# Patient Record
Sex: Female | Born: 1990 | Race: White | Hispanic: No | Marital: Married | Smoking: Former smoker
Health system: Southern US, Community
[De-identification: ages and names within clinical notes are randomized; demographics above are authoritative.]

## PROBLEM LIST (undated history)

## (undated) ENCOUNTER — Inpatient Hospital Stay (HOSPITAL_COMMUNITY): Payer: Self-pay

## (undated) DIAGNOSIS — B977 Papillomavirus as the cause of diseases classified elsewhere: Secondary | ICD-10-CM

## (undated) DIAGNOSIS — J45909 Unspecified asthma, uncomplicated: Secondary | ICD-10-CM

## (undated) DIAGNOSIS — A6000 Herpesviral infection of urogenital system, unspecified: Secondary | ICD-10-CM

## (undated) DIAGNOSIS — F32A Depression, unspecified: Secondary | ICD-10-CM

## (undated) DIAGNOSIS — F329 Major depressive disorder, single episode, unspecified: Secondary | ICD-10-CM

## (undated) DIAGNOSIS — F319 Bipolar disorder, unspecified: Secondary | ICD-10-CM

## (undated) DIAGNOSIS — N739 Female pelvic inflammatory disease, unspecified: Secondary | ICD-10-CM

## (undated) DIAGNOSIS — R87629 Unspecified abnormal cytological findings in specimens from vagina: Secondary | ICD-10-CM

## (undated) HISTORY — DX: Major depressive disorder, single episode, unspecified: F32.9

## (undated) HISTORY — DX: Bipolar disorder, unspecified: F31.9

## (undated) HISTORY — PX: WISDOM TOOTH EXTRACTION: SHX21

## (undated) HISTORY — DX: Depression, unspecified: F32.A

---

## 2013-11-15 ENCOUNTER — Encounter (HOSPITAL_BASED_OUTPATIENT_CLINIC_OR_DEPARTMENT_OTHER): Payer: Self-pay | Admitting: Emergency Medicine

## 2013-11-15 ENCOUNTER — Emergency Department (HOSPITAL_BASED_OUTPATIENT_CLINIC_OR_DEPARTMENT_OTHER)
Admission: EM | Admit: 2013-11-15 | Discharge: 2013-11-16 | Disposition: A | Discharge status: Home / Self Care | Payer: Medicaid Other | Attending: Emergency Medicine | Admitting: Emergency Medicine

## 2013-11-15 DIAGNOSIS — J45909 Unspecified asthma, uncomplicated: Secondary | ICD-10-CM | POA: Insufficient documentation

## 2013-11-15 DIAGNOSIS — Z3202 Encounter for pregnancy test, result negative: Secondary | ICD-10-CM | POA: Insufficient documentation

## 2013-11-15 DIAGNOSIS — F17200 Nicotine dependence, unspecified, uncomplicated: Secondary | ICD-10-CM | POA: Insufficient documentation

## 2013-11-15 DIAGNOSIS — N390 Urinary tract infection, site not specified: Secondary | ICD-10-CM | POA: Insufficient documentation

## 2013-11-15 HISTORY — DX: Unspecified asthma, uncomplicated: J45.909

## 2013-11-15 LAB — URINE MICROSCOPIC-ADD ON

## 2013-11-15 LAB — URINALYSIS, ROUTINE W REFLEX MICROSCOPIC
Bilirubin Urine: NEGATIVE
Glucose, UA: NEGATIVE mg/dL
HGB URINE DIPSTICK: NEGATIVE
Ketones, ur: 15 mg/dL — AB
Nitrite: NEGATIVE
PROTEIN: 100 mg/dL — AB
Specific Gravity, Urine: 1.027 (ref 1.005–1.030)
Urobilinogen, UA: 0.2 mg/dL (ref 0.0–1.0)
pH: 6 (ref 5.0–8.0)

## 2013-11-15 LAB — PREGNANCY, URINE: Preg Test, Ur: NEGATIVE

## 2013-11-15 NOTE — ED Notes (Signed)
Urinary frequency and suprapubic pain while urinating.  Right low back pain.  Sts she also has  A "yeast infection".

## 2013-11-16 MED ORDER — FLUCONAZOLE 50 MG PO TABS
150.0000 mg | ORAL_TABLET | Freq: Once | ORAL | Status: AC
  Administered 2013-11-16: 150 mg via ORAL
  Filled 2013-11-16 (×2): qty 1

## 2013-11-16 MED ORDER — NITROFURANTOIN MONOHYD MACRO 100 MG PO CAPS: 100.0000 mg | ORAL_CAPSULE | Freq: Two times a day (BID) | ORAL | Status: DC

## 2013-11-16 MED ORDER — NITROFURANTOIN MONOHYD MACRO 100 MG PO CAPS
100.0000 mg | ORAL_CAPSULE | Freq: Once | ORAL | Status: AC
  Administered 2013-11-16: 100 mg via ORAL
  Filled 2013-11-16: qty 1

## 2013-11-16 NOTE — ED Provider Notes (Signed)
CSN: [id number]     Arrival date & time 11/15/13  2224 History   First MD Initiated Contact with Patient 11/16/13 0117     Chief Complaint  Patient presents with  . Dysuria   (Consider location/radiation/quality/duration/timing/severity/associated sxs/prior Treatment) HPI 23 year old female with a several day history of urinary frequency, urinating small amounts and discomfort with urination. The discomfort is located in her suprapubic region and is characterized as like previous urinary tract infections. She is also having low back pain. She denies nausea, vomiting, diarrhea, fever, chills or vaginal bleeding.   Past Medical History  Diagnosis Date  . Acute URI   . Asthma    History reviewed. No pertinent past surgical history. No family history on file. History  Substance Use Topics  . Smoking status: Current Every Day Smoker -- 1.00 packs/day  . Smokeless tobacco: Not on file  . Alcohol Use: No   OB History   Grav Para Term Preterm Abortions TAB SAB Ect Mult Living                 Review of Systems  All other systems reviewed and are negative.    Allergies  Review of patient's allergies indicates no known allergies.  Home Medications  No current outpatient prescriptions on file. BP 115/72  Pulse 88  Temp(Src) 98.3 F (36.8 C) (Oral)  Resp 16  Ht 5\' 8"  (1.727 m)  Wt 141 lb 8 oz (64.184 kg)  BMI 21.52 kg/m2  SpO2 100%  LMP 11/05/2013  Physical Exam General: Well-developed, well-nourished female in no acute distress; appearance consistent with age of record HENT: normocephalic; atraumatic Eyes: pupils equal, round and reactive to light; extraocular muscles intact Neck: supple Heart: regular rate and rhythm; no murmurs, rubs or gallops Lungs: clear to auscultation bilaterally Abdomen: soft; nondistended; slight suprapubic tenderness; no masses or hepatosplenomegaly; bowel sounds present GU: No CVA tenderness Extremities: No deformity; full range of motion;  pulses normal Neurologic: Awake, alert and oriented; motor function intact in all extremities and symmetric; no facial droop Skin: Warm and dry Psychiatric: Normal mood and affect    ED Course  Procedures (including critical care time)  MDM   Nursing notes and vitals signs, including pulse oximetry, reviewed.  Summary of this visit's results, reviewed by myself:  Labs:  Results for orders placed during the hospital encounter of 11/15/13 (from the past 24 hour(s))  PREGNANCY, URINE     Status: None   Collection Time    11/15/13 10:45 PM      Result Value Range   Preg Test, Ur NEGATIVE  NEGATIVE  URINALYSIS, ROUTINE W REFLEX MICROSCOPIC     Status: Abnormal   Collection Time    11/15/13 10:45 PM      Result Value Range   Color, Urine YELLOW  YELLOW   APPearance CLOUDY (*) CLEAR   Specific Gravity, Urine 1.027  1.005 - 1.030   pH 6.0  5.0 - 8.0   Glucose, UA NEGATIVE  NEGATIVE mg/dL   Hgb urine dipstick NEGATIVE  NEGATIVE   Bilirubin Urine NEGATIVE  NEGATIVE   Ketones, ur 15 (*) NEGATIVE mg/dL   Protein, ur 284100 (*) NEGATIVE mg/dL   Urobilinogen, UA 0.2  0.0 - 1.0 mg/dL   Nitrite NEGATIVE  NEGATIVE   Leukocytes, UA MODERATE (*) NEGATIVE  URINE MICROSCOPIC-ADD ON     Status: Abnormal   Collection Time    11/15/13 10:45 PM      Result Value Range   Squamous Epithelial /  LPF FEW (*) RARE   WBC, UA 21-50  <3 WBC/hpf   RBC / HPF 0-2  <3 RBC/hpf   Bacteria, UA FEW (*) RARE   Casts WBC CAST (*) NEGATIVE    Imaging Studies: No results found.      Hanley Seamen, MD 11/16/13 [id number]

## 2013-11-16 NOTE — ED Notes (Signed)
MD at bedside. 

## 2013-11-19 LAB — URINE CULTURE: Colony Count: >=100000

## 2014-03-27 ENCOUNTER — Emergency Department (HOSPITAL_BASED_OUTPATIENT_CLINIC_OR_DEPARTMENT_OTHER)
Admission: EM | Admit: 2014-03-27 | Discharge: 2014-03-28 | Disposition: A | Discharge status: Home / Self Care | Payer: Medicaid Other | Attending: Emergency Medicine | Admitting: Emergency Medicine

## 2014-03-27 ENCOUNTER — Encounter (HOSPITAL_BASED_OUTPATIENT_CLINIC_OR_DEPARTMENT_OTHER): Payer: Self-pay | Admitting: Emergency Medicine

## 2014-03-27 DIAGNOSIS — N390 Urinary tract infection, site not specified: Secondary | ICD-10-CM

## 2014-03-27 DIAGNOSIS — F17200 Nicotine dependence, unspecified, uncomplicated: Secondary | ICD-10-CM | POA: Insufficient documentation

## 2014-03-27 DIAGNOSIS — Z792 Long term (current) use of antibiotics: Secondary | ICD-10-CM | POA: Insufficient documentation

## 2014-03-27 DIAGNOSIS — Z3202 Encounter for pregnancy test, result negative: Secondary | ICD-10-CM | POA: Insufficient documentation

## 2014-03-27 DIAGNOSIS — J45909 Unspecified asthma, uncomplicated: Secondary | ICD-10-CM | POA: Insufficient documentation

## 2014-03-27 LAB — URINALYSIS, ROUTINE W REFLEX MICROSCOPIC
Bilirubin Urine: NEGATIVE
Glucose, UA: NEGATIVE mg/dL
Ketones, ur: NEGATIVE mg/dL
NITRITE: POSITIVE — AB
PH: 6 (ref 5.0–8.0)
Protein, ur: NEGATIVE mg/dL
SPECIFIC GRAVITY, URINE: 1.023 (ref 1.005–1.030)
Urobilinogen, UA: 0.2 mg/dL (ref 0.0–1.0)

## 2014-03-27 LAB — URINE MICROSCOPIC-ADD ON

## 2014-03-27 LAB — PREGNANCY, URINE: PREG TEST UR: NEGATIVE

## 2014-03-27 NOTE — ED Provider Notes (Signed)
CSN: [id number]     Arrival date & time 03/27/14  2152 History  This chart was scribed for Amy SkeensJoshua M Zavitz, MD by Charline BillsEssence Howell, ED Scribe. The patient was seen in room MH09/MH09. Patient's care was started at 11:52 PM.   Chief Complaint  Patient presents with  . Urinary Tract Infection   The history is provided by the patient. No language interpreter was used.   HPI Comments: Amy Little is a 23 y.o. female who presents to the Emergency Department complaining of urinary frequency. She reports associated dysuria and mild abdominal pain. She denies fever, chills, vomiting, diarrhea, SOB.  Past Medical History  Diagnosis Date  . Acute URI   . Asthma    History reviewed. No pertinent past surgical history. No family history on file. History  Substance Use Topics  . Smoking status: Current Every Day Smoker -- 1.00 packs/day  . Smokeless tobacco: Not on file  . Alcohol Use: No   OB History   Grav Para Term Preterm Abortions TAB SAB Ect Mult Living                 Review of Systems  Constitutional: Negative for fever and chills.  Respiratory: Negative for shortness of breath.   Gastrointestinal: Positive for abdominal pain. Negative for vomiting and diarrhea.  Genitourinary: Positive for dysuria and frequency.  All other systems reviewed and are negative.  Allergies  Review of patient's allergies indicates no known allergies.  Home Medications   Prior to Admission medications   Medication Sig Start Date End Date Taking? Authorizing Provider  nitrofurantoin, macrocrystal-monohydrate, (MACROBID) 100 MG capsule Take 1 capsule (100 mg total) by mouth 2 (two) times daily. X 7 days 11/16/13   Hanley SeamenJohn L Molpus, MD   Triage Vitals: BP 118/76  Pulse 78  Temp(Src) 97.8 F (36.6 C) (Oral)  Resp 18  Ht 5\' 8"  (1.727 m)  Wt 150 lb (68.04 kg)  BMI 22.81 kg/m2  SpO2 100%  LMP 03/13/2014 Physical Exam  Nursing note and vitals reviewed. Constitutional: She is oriented to person, place,  and time. She appears well-developed and well-nourished.  HENT:  Head: Normocephalic and atraumatic.  Eyes: Conjunctivae and EOM are normal.  Neck: Neck supple.  Cardiovascular: Normal rate.   Pulmonary/Chest: Effort normal. No respiratory distress.  Abdominal: Soft. There is no rebound and no guarding.  Musculoskeletal: Normal range of motion.  Neurological: She is alert and oriented to person, place, and time.  Skin: Skin is warm and dry.  Psychiatric: She has a normal mood and affect. Her behavior is normal.   ED Course  Procedures (including critical care time) DIAGNOSTIC STUDIES: Oxygen Saturation is 100% on RA, normal by my interpretation.    COORDINATION OF CARE: 11:54 PM-Discussed treatment plan with pt at bedside and pt agreed to plan.   Labs Review Labs Reviewed  URINALYSIS, ROUTINE W REFLEX MICROSCOPIC - Abnormal; Notable for the following:    APPearance CLOUDY (*)    Hgb urine dipstick SMALL (*)    Nitrite POSITIVE (*)    Leukocytes, UA MODERATE (*)    All other components within normal limits  URINE MICROSCOPIC-ADD ON - Abnormal; Notable for the following:    Squamous Epithelial / LPF FEW (*)    Bacteria, UA MANY (*)    All other components within normal limits  PREGNANCY, URINE    Imaging Review No results found.   EKG Interpretation None      MDM   Final diagnoses:  UTI (  lower urinary tract infection)    Well-appearing female with clinical UTI. Urinalysis confirm. No signs of pyelonephritis. I personally performed the services described in this documentation, which was scribed in my presence. The recorded information has been reviewed and is accurate.    Amy SkeensJoshua M Zavitz, MD 03/28/14 [phone]

## 2014-03-27 NOTE — ED Notes (Signed)
Pt. Reports urinary frequency and painful urination.  Pt. Reports having UTI in past.

## 2014-03-28 ENCOUNTER — Telehealth (HOSPITAL_BASED_OUTPATIENT_CLINIC_OR_DEPARTMENT_OTHER): Payer: Self-pay | Admitting: *Deleted

## 2014-03-28 MED ORDER — CEPHALEXIN 500 MG PO CAPS: 500.0000 mg | ORAL_CAPSULE | Freq: Two times a day (BID) | ORAL | Status: DC

## 2014-03-28 MED ORDER — CEPHALEXIN 250 MG PO CAPS
500.0000 mg | ORAL_CAPSULE | Freq: Once | ORAL | Status: AC
  Administered 2014-03-28: 500 mg via ORAL
  Filled 2014-03-28: qty 2

## 2014-03-28 MED ORDER — ACETAMINOPHEN 325 MG PO TABS
650.0000 mg | ORAL_TABLET | Freq: Once | ORAL | Status: AC
  Administered 2014-03-28: 650 mg via ORAL
  Filled 2014-03-28: qty 2

## 2014-03-28 NOTE — Discharge Instructions (Signed)
If you were given medicines take as directed.  If you are on coumadin or contraceptives realize their levels and effectiveness is altered by many different medicines.  If you have any reaction (rash, tongues swelling, other) to the medicines stop taking and see a physician.   Please follow up as directed and return to the ER or see a physician for new or worsening symptoms.  Thank you. Filed Vitals:   03/27/14 2159  BP: 118/76  Pulse: 78  Temp: 97.8 F (36.6 C)  TempSrc: Oral  Resp: 18  Height: 5\' 8"  (1.727 m)  Weight: 150 lb (68.04 kg)  SpO2: 100%

## 2014-03-28 NOTE — ED Notes (Signed)
[facility] for keflex [facility]

## 2014-04-07 ENCOUNTER — Emergency Department (HOSPITAL_BASED_OUTPATIENT_CLINIC_OR_DEPARTMENT_OTHER)
Admission: EM | Admit: 2014-04-07 | Discharge: 2014-04-07 | Disposition: A | Discharge status: Home / Self Care | Payer: Medicaid Other | Attending: Emergency Medicine | Admitting: Emergency Medicine

## 2014-04-07 ENCOUNTER — Emergency Department (HOSPITAL_BASED_OUTPATIENT_CLINIC_OR_DEPARTMENT_OTHER): Payer: Medicaid Other

## 2014-04-07 ENCOUNTER — Encounter (HOSPITAL_BASED_OUTPATIENT_CLINIC_OR_DEPARTMENT_OTHER): Payer: Self-pay | Admitting: Emergency Medicine

## 2014-04-07 DIAGNOSIS — Z3202 Encounter for pregnancy test, result negative: Secondary | ICD-10-CM | POA: Insufficient documentation

## 2014-04-07 DIAGNOSIS — Z792 Long term (current) use of antibiotics: Secondary | ICD-10-CM | POA: Insufficient documentation

## 2014-04-07 DIAGNOSIS — J45909 Unspecified asthma, uncomplicated: Secondary | ICD-10-CM | POA: Insufficient documentation

## 2014-04-07 DIAGNOSIS — Z8709 Personal history of other diseases of the respiratory system: Secondary | ICD-10-CM | POA: Insufficient documentation

## 2014-04-07 DIAGNOSIS — F17200 Nicotine dependence, unspecified, uncomplicated: Secondary | ICD-10-CM | POA: Insufficient documentation

## 2014-04-07 DIAGNOSIS — Z79899 Other long term (current) drug therapy: Secondary | ICD-10-CM | POA: Insufficient documentation

## 2014-04-07 DIAGNOSIS — R1110 Vomiting, unspecified: Secondary | ICD-10-CM | POA: Insufficient documentation

## 2014-04-07 DIAGNOSIS — R1031 Right lower quadrant pain: Secondary | ICD-10-CM | POA: Insufficient documentation

## 2014-04-07 LAB — URINALYSIS, ROUTINE W REFLEX MICROSCOPIC
BILIRUBIN URINE: NEGATIVE
GLUCOSE, UA: NEGATIVE mg/dL
HGB URINE DIPSTICK: NEGATIVE
KETONES UR: NEGATIVE mg/dL
Leukocytes, UA: NEGATIVE
Nitrite: NEGATIVE
PROTEIN: NEGATIVE mg/dL
Specific Gravity, Urine: 1.007 (ref 1.005–1.030)
Urobilinogen, UA: 0.2 mg/dL (ref 0.0–1.0)
pH: 7 (ref 5.0–8.0)

## 2014-04-07 LAB — CBC WITH DIFFERENTIAL/PLATELET
Basophils Absolute: 0 10*3/uL (ref 0.0–0.1)
Basophils Relative: 0 % (ref 0–1)
EOS PCT: 7 % — AB (ref 0–5)
Eosinophils Absolute: 0.5 10*3/uL (ref 0.0–0.7)
HCT: 40.8 % (ref 36.0–46.0)
Hemoglobin: 14.1 g/dL (ref 12.0–15.0)
LYMPHS PCT: 31 % (ref 12–46)
Lymphs Abs: 2.2 10*3/uL (ref 0.7–4.0)
MCH: 31.9 pg (ref 26.0–34.0)
MCHC: 34.6 g/dL (ref 30.0–36.0)
MCV: 92.3 fL (ref 78.0–100.0)
Monocytes Absolute: 0.5 10*3/uL (ref 0.1–1.0)
Monocytes Relative: 8 % (ref 3–12)
NEUTROS ABS: 3.9 10*3/uL (ref 1.7–7.7)
Neutrophils Relative %: 55 % (ref 43–77)
PLATELETS: 243 10*3/uL (ref 150–400)
RBC: 4.42 MIL/uL (ref 3.87–5.11)
RDW: 12.3 % (ref 11.5–15.5)
WBC: 7.2 10*3/uL (ref 4.0–10.5)

## 2014-04-07 LAB — BASIC METABOLIC PANEL
BUN: 8 mg/dL (ref 6–23)
CO2: 25 mEq/L (ref 19–32)
Calcium: 9.8 mg/dL (ref 8.4–10.5)
Chloride: 102 mEq/L (ref 96–112)
Creatinine, Ser: 0.7 mg/dL (ref 0.50–1.10)
GFR calc Af Amer: >90 mL/min (ref >90)
Glucose, Bld: 90 mg/dL (ref 70–99)
POTASSIUM: 4 meq/L (ref 3.7–5.3)
Sodium: 140 mEq/L (ref 137–147)

## 2014-04-07 LAB — WET PREP, GENITAL
CLUE CELLS WET PREP: NONE SEEN
TRICH WET PREP: NONE SEEN
Yeast Wet Prep HPF POC: NONE SEEN

## 2014-04-07 LAB — PREGNANCY, URINE: Preg Test, Ur: NEGATIVE

## 2014-04-07 MED ORDER — TRAMADOL HCL 50 MG PO TABS: 50.0000 mg | ORAL_TABLET | Freq: Four times a day (QID) | ORAL | Status: DC | PRN

## 2014-04-07 MED ORDER — MORPHINE SULFATE 4 MG/ML IJ SOLN
4.0000 mg | Freq: Once | INTRAMUSCULAR | Status: DC
  Filled 2014-04-07: qty 1

## 2014-04-07 MED ORDER — IOHEXOL 300 MG/ML  SOLN
100.0000 mL | Freq: Once | INTRAMUSCULAR | Status: AC | PRN
  Administered 2014-04-07: 100 mL via INTRAVENOUS

## 2014-04-07 MED ORDER — ONDANSETRON HCL 4 MG/2ML IJ SOLN
4.0000 mg | Freq: Once | INTRAMUSCULAR | Status: AC
  Administered 2014-04-07: 4 mg via INTRAVENOUS
  Filled 2014-04-07: qty 2

## 2014-04-07 MED ORDER — IOHEXOL 300 MG/ML  SOLN
50.0000 mL | Freq: Once | INTRAMUSCULAR | Status: AC | PRN
  Administered 2014-04-07: 50 mL via ORAL

## 2014-04-07 NOTE — ED Provider Notes (Signed)
CSN: [id number]     Arrival date & time 04/07/14  1239 History   First MD Initiated Contact with Patient 04/07/14 1349     Chief Complaint  Patient presents with  . Abdominal Pain     (Consider location/radiation/quality/duration/timing/severity/associated sxs/prior Treatment) Patient is a 23 y.o. female presenting with abdominal pain. The history is provided by the patient. No language interpreter was used.  Abdominal Pain Pain location:  RLQ Pain quality: aching   Pain radiates to:  Does not radiate Pain severity:  Moderate Onset quality:  Sudden Duration:  1 day Timing:  Constant Progression:  Unchanged Context: not alcohol use   Relieved by:  Nothing Worsened by:  Nothing tried Associated symptoms: vomiting   Associated symptoms: no fever, no nausea and no vaginal discharge     Past Medical History  Diagnosis Date  . Acute URI   . Asthma    History reviewed. No pertinent past surgical history. History reviewed. No pertinent family history. History  Substance Use Topics  . Smoking status: Current Every Day Smoker -- 1.00 packs/day  . Smokeless tobacco: Not on file  . Alcohol Use: No   OB History   Grav Para Term Preterm Abortions TAB SAB Ect Mult Living                 Review of Systems  Constitutional: Negative for fever.  Respiratory: Negative.   Cardiovascular: Negative.   Gastrointestinal: Positive for vomiting and abdominal pain. Negative for nausea.  Genitourinary: Negative for vaginal discharge.      Allergies  Review of patient's allergies indicates no known allergies.  Home Medications   Prior to Admission medications   Medication Sig Start Date End Date Taking? Authorizing Provider  cephALEXin (KEFLEX) 500 MG capsule Take 1 capsule (500 mg total) by mouth 2 (two) times daily. 03/28/14   Enid SkeensJoshua M Zavitz, MD  nitrofurantoin, macrocrystal-monohydrate, (MACROBID) 100 MG capsule Take 1 capsule (100 mg total) by mouth 2 (two) times daily. X 7 days  11/16/13   Carlisle BeersJohn L Molpus, MD   BP 133/83  Pulse 102  Temp(Src) 98.3 F (36.8 C) (Oral)  Resp 16  Ht 5\' 8"  (1.727 m)  Wt 150 lb (68.04 kg)  BMI 22.81 kg/m2  SpO2 100%  LMP 03/13/2014 Physical Exam  Nursing note and vitals reviewed. Constitutional: She is oriented to person, place, and time. She appears well-developed and well-nourished.  HENT:  Head: Normocephalic and atraumatic.  Cardiovascular: Normal rate and regular rhythm.   Pulmonary/Chest: Effort normal and breath sounds normal.  Abdominal: Bowel sounds are normal. There is tenderness in the right lower quadrant.  Genitourinary:  White discharge. -cmt  Musculoskeletal: Normal range of motion.  Neurological: She is alert and oriented to person, place, and time.  Skin: Skin is warm and dry.  Psychiatric: She has a normal mood and affect.    ED Course  Procedures (including critical care time) Labs Review Labs Reviewed  WET PREP, GENITAL - Abnormal; Notable for the following:    WBC, Wet Prep HPF POC FEW (*)    All other components within normal limits  CBC WITH DIFFERENTIAL - Abnormal; Notable for the following:    Eosinophils Relative 7 (*)    All other components within normal limits  GC/CHLAMYDIA PROBE AMP  URINALYSIS, ROUTINE W REFLEX MICROSCOPIC  PREGNANCY, URINE  BASIC METABOLIC PANEL    Imaging Review Ct Abdomen Pelvis W Contrast  04/07/2014   CLINICAL DATA:  Right lower quadrant pain for 1  day.  EXAM: CT ABDOMEN AND PELVIS WITH CONTRAST  TECHNIQUE: Multidetector CT imaging of the abdomen and pelvis was performed using the standard protocol following bolus administration of intravenous contrast.  CONTRAST:  50 mL OMNIPAQUE IOHEXOL 300 MG/ML SOLN, 100 mL OMNIPAQUE IOHEXOL 300 MG/ML SOLN  COMPARISON:  None.  FINDINGS: The lung bases are clear.  No pleural or pericardial effusion.  The appendix is visualized in the right lower quadrant and appears normal. The stomach and small and large bowel are unremarkable.  Uterus, adnexa and urinary bladder appear normal. The gallbladder, liver, adrenal glands, pancreas and kidneys are unremarkable. Tiny low attenuating lesion in the spleen on image 11 is likely a cyst. Trace amount of free pelvic fluid is consistent with physiologic change. No lymphadenopathy is identified. There is no focal bony abnormality.  IMPRESSION: Negative for appendicitis or other finding to explain the patient's symptoms. Negative exam.   Electronically Signed   By: Drusilla Kannerhomas  Dalessio M.D.   On: 04/07/2014 15:41     EKG Interpretation None      MDM   Final diagnoses:  Right lower quadrant abdominal pain    No acute abnormality for the reason for lower abdomina pain. Will send home with something for pain. Pelvic not consistent with a pid    Teressa LowerVrinda Pickering, NP 04/07/14 1611

## 2014-04-07 NOTE — ED Notes (Signed)
Pt c/o right lower abd pain  X 1 day

## 2014-04-07 NOTE — Discharge Instructions (Signed)
Abdominal Pain, Women °Abdominal (stomach, pelvic, or belly) pain can be caused by many things. It is important to tell your doctor: °· The location of the pain. °· Does it come and go or is it present all the time? °· Are there things that start the pain (eating certain foods, exercise)? °· Are there other symptoms associated with the pain (fever, nausea, vomiting, diarrhea)? °All of this is helpful to know when trying to find the cause of the pain. °CAUSES  °· Stomach: virus or bacteria infection, or ulcer. °· Intestine: appendicitis (inflamed appendix), regional ileitis (Crohn's disease), ulcerative colitis (inflamed colon), irritable bowel syndrome, diverticulitis (inflamed diverticulum of the colon), or cancer of the stomach or intestine. °· Gallbladder disease or stones in the gallbladder. °· Kidney disease, kidney stones, or infection. °· Pancreas infection or cancer. °· Fibromyalgia (pain disorder). °· Diseases of the female organs: °¨ Uterus: fibroid (non-cancerous) tumors or infection. °¨ Fallopian tubes: infection or tubal pregnancy. °¨ Ovary: cysts or tumors. °¨ Pelvic adhesions (scar tissue). °¨ Endometriosis (uterus lining tissue growing in the pelvis and on the pelvic organs). °¨ Pelvic congestion syndrome (female organs filling up with blood just before the menstrual period). °¨ Pain with the menstrual period. °¨ Pain with ovulation (producing an egg). °¨ Pain with an IUD (intrauterine device, birth control) in the uterus. °¨ Cancer of the female organs. °· Functional pain (pain not caused by a disease, may improve without treatment). °· Psychological pain. °· Depression. °DIAGNOSIS  °Your doctor will decide the seriousness of your pain by doing an examination. °· Blood tests. °· X-rays. °· Ultrasound. °· CT scan (computed tomography, special type of X-ray). °· MRI (magnetic resonance imaging). °· Cultures, for infection. °· Barium enema (dye inserted in the large intestine, to better view it with  X-rays). °· Colonoscopy (looking in intestine with a lighted tube). °· Laparoscopy (minor surgery, looking in abdomen with a lighted tube). °· Major abdominal exploratory surgery (looking in abdomen with a large incision). °TREATMENT  °The treatment will depend on the cause of the pain.  °· Many cases can be observed and treated at home. °· Over-the-counter medicines recommended by your caregiver. °· Prescription medicine. °· Antibiotics, for infection. °· Birth control pills, for painful periods or for ovulation pain. °· Hormone treatment, for endometriosis. °· Nerve blocking injections. °· Physical therapy. °· Antidepressants. °· Counseling with a psychologist or psychiatrist. °· Minor or major surgery. °HOME CARE INSTRUCTIONS  °· Do not take laxatives, unless directed by your caregiver. °· Take over-the-counter pain medicine only if ordered by your caregiver. Do not take aspirin because it can cause an upset stomach or bleeding. °· Try a clear liquid diet (broth or water) as ordered by your caregiver. Slowly move to a bland diet, as tolerated, if the pain is related to the stomach or intestine. °· Have a thermometer and take your temperature several times a day, and record it. °· Bed rest and sleep, if it helps the pain. °· Avoid sexual intercourse, if it causes pain. °· Avoid stressful situations. °· Keep your follow-up appointments and tests, as your caregiver orders. °· If the pain does not go away with medicine or surgery, you may try: °¨ Acupuncture. °¨ Relaxation exercises (yoga, meditation). °¨ Group therapy. °¨ Counseling. °SEEK MEDICAL CARE IF:  °· You notice certain foods cause stomach pain. °· Your home care treatment is not helping your pain. °· You need stronger pain medicine. °· You want your IUD removed. °· You feel faint or   lightheaded. °· You develop nausea and vomiting. °· You develop a rash. °· You are having side effects or an allergy to your medicine. °SEEK IMMEDIATE MEDICAL CARE IF:  °· Your  pain does not go away or gets worse. °· You have a fever. °· Your pain is felt only in portions of the abdomen. The right side could possibly be appendicitis. The left lower portion of the abdomen could be colitis or diverticulitis. °· You are passing blood in your stools (bright red or black tarry stools, with or without vomiting). °· You have blood in your urine. °· You develop chills, with or without a fever. °· You pass out. °MAKE SURE YOU:  °· Understand these instructions. °· Will watch your condition. °· Will get help right away if you are not doing well or get worse. °Document Released: 07/24/2007 Document Revised: 12/19/2011 Document Reviewed: 08/13/2009 °[employer]® Patient Information ©2015 [employer]. This information is not intended to replace advice given to you by your health care provider. Make sure you discuss any questions you have with your health care provider. ° °

## 2014-04-08 LAB — GC/CHLAMYDIA PROBE AMP
CT Probe RNA: NEGATIVE
GC Probe RNA: NEGATIVE

## 2014-04-08 NOTE — ED Provider Notes (Signed)
Medical screening examination/treatment/procedure(s) were performed by non-physician practitioner and as supervising physician I was immediately available for consultation/collaboration.   EKG Interpretation None       Ethelda ChickMartha K Linker, MD 04/08/14 [phone]

## 2014-05-03 ENCOUNTER — Emergency Department (HOSPITAL_BASED_OUTPATIENT_CLINIC_OR_DEPARTMENT_OTHER)
Admission: EM | Admit: 2014-05-03 | Discharge: 2014-05-03 | Disposition: A | Discharge status: Home / Self Care | Payer: Medicaid Other | Attending: Emergency Medicine | Admitting: Emergency Medicine

## 2014-05-03 ENCOUNTER — Encounter (HOSPITAL_BASED_OUTPATIENT_CLINIC_OR_DEPARTMENT_OTHER): Payer: Self-pay | Admitting: Emergency Medicine

## 2014-05-03 DIAGNOSIS — Z792 Long term (current) use of antibiotics: Secondary | ICD-10-CM | POA: Diagnosis not present

## 2014-05-03 DIAGNOSIS — H109 Unspecified conjunctivitis: Secondary | ICD-10-CM | POA: Insufficient documentation

## 2014-05-03 DIAGNOSIS — H5789 Other specified disorders of eye and adnexa: Secondary | ICD-10-CM | POA: Diagnosis present

## 2014-05-03 DIAGNOSIS — F17200 Nicotine dependence, unspecified, uncomplicated: Secondary | ICD-10-CM | POA: Insufficient documentation

## 2014-05-03 DIAGNOSIS — J45909 Unspecified asthma, uncomplicated: Secondary | ICD-10-CM | POA: Diagnosis not present

## 2014-05-03 DIAGNOSIS — Z79899 Other long term (current) drug therapy: Secondary | ICD-10-CM | POA: Diagnosis not present

## 2014-05-03 MED ORDER — ERYTHROMYCIN 5 MG/GM OP OINT
TOPICAL_OINTMENT | Freq: Four times a day (QID) | OPHTHALMIC | Status: DC
  Administered 2014-05-03: 1 via OPHTHALMIC
  Filled 2014-05-03: qty 3.5

## 2014-05-03 MED ORDER — ALBUTEROL SULFATE HFA 108 (90 BASE) MCG/ACT IN AERS: 2.0000 | INHALATION_SPRAY | RESPIRATORY_TRACT | Status: DC | PRN

## 2014-05-03 NOTE — Discharge Instructions (Signed)

## 2014-05-03 NOTE — ED Provider Notes (Signed)
CSN: [id number]     Arrival date & time 05/03/14  2330 History   This chart was scribed for Amy Seamen, MD, by Yevette Edwards, ED Scribe. This patient was seen in room MH06/MH06 and the patient's care was started at 11:39 PM.  First MD Initiated Contact with Patient 05/03/14 2338     Chief Complaint  Patient presents with  . Conjunctivitis    HPI HPI Comments: Sparrow Siracusa is a 23 y.o. female who presents to the Emergency Department complaining of irritation and redness of her right eye which was first noticed this morning. She reports subjective swelling and clear discharge from that eye. She denies visual change. She also reports mild pain to her eye when she was in the shower and washed out her eye. The pt endorses a cold last week which included a cough, rhinorrhea, and congestion. The cold symptoms have resolved except for a lingering cough. She denies abdominal pain. She has a h/o asthma, and she requests an inhaler.   Past Medical History  Diagnosis Date  . Acute URI   . Asthma    History reviewed. No pertinent past surgical history. History reviewed. No pertinent family history. History  Substance Use Topics  . Smoking status: Current Every Day Smoker -- 1.00 packs/day  . Smokeless tobacco: Not on file  . Alcohol Use: No    Review of Systems  A complete 10 system review of systems was obtained, and all systems were negative except where indicated in the HPI and PE.   Allergies  Review of patient's allergies indicates no known allergies.  Home Medications   Prior to Admission medications   Medication Sig Start Date End Date Taking? Authorizing Provider  albuterol (PROVENTIL HFA;VENTOLIN HFA) 108 (90 BASE) MCG/ACT inhaler Inhale 2 puffs into the lungs every 4 (four) hours as needed for wheezing or shortness of breath. 05/03/14   Carlisle Beers Dustine Bertini, MD  cephALEXin (KEFLEX) 500 MG capsule Take 1 capsule (500 mg total) by mouth 2 (two) times daily. 03/28/14   Enid Skeens, MD   nitrofurantoin, macrocrystal-monohydrate, (MACROBID) 100 MG capsule Take 1 capsule (100 mg total) by mouth 2 (two) times daily. X 7 days 11/16/13   Carlisle Beers Liley Rake, MD  traMADol (ULTRAM) 50 MG tablet Take 1 tablet (50 mg total) by mouth every 6 (six) hours as needed. 04/07/14   Teressa Lower, NP   Triage Vitals:  BP 121/66  Pulse 83  Temp(Src) 97.9 F (36.6 C) (Oral)  Resp 16  Ht 5\' 8"  (1.727 m)  Wt 143 lb (64.864 kg)  BMI 21.75 kg/m2  SpO2 98%  Physical Exam  General: Well-developed, well-nourished female in no acute distress; appearance consistent with age of record HENT: normocephalic; atraumatic; TMs normal; no pharyngeal erythema or exudate Eyes: pupils equal, round and reactive to light; extraocular muscles intact; right conjunctival injection; no exudate seen Neck: supple Heart: regular rate and rhythm; no murmurs, rubs or gallops Lungs: clear to auscultation bilaterally Abdomen: soft; nondistended; nontender; no masses or hepatosplenomegaly; bowel sounds present Extremities: No deformity; full range of motion; pulses normal Neurologic: Awake, alert and oriented; motor function intact in all extremities and symmetric; no facial droop Skin: Warm and dry Psychiatric: Normal mood and affect  ED Course  Procedures (including critical care time)  DIAGNOSTIC STUDIES: Oxygen Saturation is 98% on room air, normal by my interpretation.    COORDINATION OF CARE:  11:45 PM- Discussed treatment plan with patient, and the patient agreed to the plan.  MDM    Final diagnoses:  Conjunctivitis of right eye   I personally performed the services described in this documentation, which was scribed in my presence. The recorded information has been reviewed and is accurate.   Amy SeamenJohn L Janell Keeling, MD 05/04/14 [phone]

## 2014-05-03 NOTE — ED Notes (Signed)
Pt states she woke up with redness to her right eye. Pt states drainage as well.

## 2014-07-21 ENCOUNTER — Encounter (HOSPITAL_BASED_OUTPATIENT_CLINIC_OR_DEPARTMENT_OTHER): Payer: Self-pay | Admitting: Emergency Medicine

## 2014-07-21 DIAGNOSIS — Z79899 Other long term (current) drug therapy: Secondary | ICD-10-CM | POA: Diagnosis not present

## 2014-07-21 DIAGNOSIS — Z720 Tobacco use: Secondary | ICD-10-CM | POA: Insufficient documentation

## 2014-07-21 DIAGNOSIS — J45909 Unspecified asthma, uncomplicated: Secondary | ICD-10-CM | POA: Insufficient documentation

## 2014-07-21 DIAGNOSIS — Z3202 Encounter for pregnancy test, result negative: Secondary | ICD-10-CM | POA: Insufficient documentation

## 2014-07-21 DIAGNOSIS — N739 Female pelvic inflammatory disease, unspecified: Secondary | ICD-10-CM | POA: Insufficient documentation

## 2014-07-21 DIAGNOSIS — N760 Acute vaginitis: Secondary | ICD-10-CM | POA: Insufficient documentation

## 2014-07-21 DIAGNOSIS — Z792 Long term (current) use of antibiotics: Secondary | ICD-10-CM | POA: Insufficient documentation

## 2014-07-21 DIAGNOSIS — R102 Pelvic and perineal pain: Secondary | ICD-10-CM | POA: Diagnosis present

## 2014-07-21 LAB — URINALYSIS, ROUTINE W REFLEX MICROSCOPIC
Bilirubin Urine: NEGATIVE
GLUCOSE, UA: NEGATIVE mg/dL
Hgb urine dipstick: NEGATIVE
Ketones, ur: NEGATIVE mg/dL
Leukocytes, UA: NEGATIVE
Nitrite: NEGATIVE
PROTEIN: NEGATIVE mg/dL
SPECIFIC GRAVITY, URINE: 1.033 — AB (ref 1.005–1.030)
Urobilinogen, UA: 0.2 mg/dL (ref 0.0–1.0)
pH: 6 (ref 5.0–8.0)

## 2014-07-21 LAB — PREGNANCY, URINE: Preg Test, Ur: NEGATIVE

## 2014-07-21 NOTE — ED Notes (Addendum)
Patient presents to ED with complaints of pelvic pain and lower back pain for 4-5 days ago. Pt states she went to her PCP today and was given 800mg  motrin,  no urine  No blood or pelvic was performed at PCP. Pt states she woke up sweating tonight and pain feels like it is getting worse.  Pt states she took motrin 3 times today with no relief. No complaints of any urinary symptoms. Pt states pain is worse on rt lower side at times. Pt tearful in triage.

## 2014-07-22 ENCOUNTER — Emergency Department (HOSPITAL_BASED_OUTPATIENT_CLINIC_OR_DEPARTMENT_OTHER)
Admission: EM | Admit: 2014-07-22 | Discharge: 2014-07-22 | Disposition: A | Discharge status: Home / Self Care | Payer: Medicaid Other | Attending: Emergency Medicine | Admitting: Emergency Medicine

## 2014-07-22 DIAGNOSIS — N739 Female pelvic inflammatory disease, unspecified: Secondary | ICD-10-CM

## 2014-07-22 DIAGNOSIS — N760 Acute vaginitis: Secondary | ICD-10-CM

## 2014-07-22 DIAGNOSIS — B9689 Other specified bacterial agents as the cause of diseases classified elsewhere: Secondary | ICD-10-CM

## 2014-07-22 LAB — COMPREHENSIVE METABOLIC PANEL
ALK PHOS: 41 U/L (ref 39–117)
ALT: 10 U/L (ref 0–35)
AST: 12 U/L (ref 0–37)
Albumin: 4.1 g/dL (ref 3.5–5.2)
Anion gap: 11 (ref 5–15)
BILIRUBIN TOTAL: 0.3 mg/dL (ref 0.3–1.2)
BUN: 14 mg/dL (ref 6–23)
CHLORIDE: 104 meq/L (ref 96–112)
CO2: 26 mEq/L (ref 19–32)
Calcium: 9.3 mg/dL (ref 8.4–10.5)
Creatinine, Ser: 0.7 mg/dL (ref 0.50–1.10)
GFR calc Af Amer: >90 mL/min (ref >90)
GLUCOSE: 99 mg/dL (ref 70–99)
POTASSIUM: 4.2 meq/L (ref 3.7–5.3)
SODIUM: 141 meq/L (ref 137–147)
Total Protein: 7.1 g/dL (ref 6.0–8.3)

## 2014-07-22 LAB — CBC WITH DIFFERENTIAL/PLATELET
Basophils Absolute: 0.1 10*3/uL (ref 0.0–0.1)
Basophils Relative: 1 % (ref 0–1)
EOS PCT: 6 % — AB (ref 0–5)
Eosinophils Absolute: 0.4 10*3/uL (ref 0.0–0.7)
HCT: 37.3 % (ref 36.0–46.0)
Hemoglobin: 12.9 g/dL (ref 12.0–15.0)
LYMPHS ABS: 2.8 10*3/uL (ref 0.7–4.0)
Lymphocytes Relative: 40 % (ref 12–46)
MCH: 31.9 pg (ref 26.0–34.0)
MCHC: 34.6 g/dL (ref 30.0–36.0)
MCV: 92.3 fL (ref 78.0–100.0)
Monocytes Absolute: 0.5 10*3/uL (ref 0.1–1.0)
Monocytes Relative: 8 % (ref 3–12)
NEUTROS PCT: 45 % (ref 43–77)
Neutro Abs: 3.3 10*3/uL (ref 1.7–7.7)
Platelets: 240 10*3/uL (ref 150–400)
RBC: 4.04 MIL/uL (ref 3.87–5.11)
RDW: 12.2 % (ref 11.5–15.5)
WBC: 7.1 10*3/uL (ref 4.0–10.5)

## 2014-07-22 LAB — SYPHILIS: RPR W/REFLEX TO RPR TITER AND TREPONEMAL ANTIBODIES, TRADITIONAL SCREENING AND DIAGNOSIS ALGORITHM

## 2014-07-22 LAB — WET PREP, GENITAL
Trich, Wet Prep: NONE SEEN
Yeast Wet Prep HPF POC: NONE SEEN

## 2014-07-22 LAB — HIV ANTIBODY (ROUTINE TESTING W REFLEX): HIV: NONREACTIVE

## 2014-07-22 LAB — LIPASE, BLOOD: Lipase: 35 U/L (ref 11–59)

## 2014-07-22 MED ORDER — METRONIDAZOLE 500 MG PO TABS
500.0000 mg | ORAL_TABLET | Freq: Once | ORAL | Status: AC
  Administered 2014-07-22: 500 mg via ORAL
  Filled 2014-07-22: qty 1

## 2014-07-22 MED ORDER — DOXYCYCLINE HYCLATE 100 MG PO CAPS: 100.0000 mg | ORAL_CAPSULE | Freq: Two times a day (BID) | ORAL | Status: DC

## 2014-07-22 MED ORDER — DOXYCYCLINE HYCLATE 100 MG PO TABS
100.0000 mg | ORAL_TABLET | Freq: Once | ORAL | Status: AC
  Administered 2014-07-22: 100 mg via ORAL
  Filled 2014-07-22: qty 1

## 2014-07-22 MED ORDER — METRONIDAZOLE 500 MG PO TABS: 500.0000 mg | ORAL_TABLET | Freq: Two times a day (BID) | ORAL | Status: DC

## 2014-07-22 MED ORDER — OXYCODONE-ACETAMINOPHEN 5-325 MG PO TABS: 1.0000 | ORAL_TABLET | ORAL | Status: DC | PRN

## 2014-07-22 MED ORDER — CEFTRIAXONE SODIUM 250 MG IJ SOLR
250.0000 mg | Freq: Once | INTRAMUSCULAR | Status: AC
  Administered 2014-07-22: 250 mg via INTRAMUSCULAR
  Filled 2014-07-22: qty 250

## 2014-07-22 NOTE — Discharge Instructions (Signed)
Pelvic Inflammatory Disease Pelvic inflammatory disease (PID) refers to an infection in some or all of the female organs. The infection can be in the uterus, ovaries, fallopian tubes, or the surrounding tissues in the pelvis. PID can cause abdominal or pelvic pain that comes on suddenly (acute pelvic pain). PID is a serious infection because it can lead to lasting (chronic) pelvic pain or the inability to have children (infertile).  CAUSES  The infection is often caused by the normal bacteria found in the vaginal tissues. PID may also be caused by an infection that is spread during sexual contact. PID can also occur following:   The birth of a baby.   A miscarriage.   An abortion.   Major pelvic surgery.   The use of an intrauterine device (IUD).   A sexual assault.  RISK FACTORS Certain factors can put a person at higher risk for PID, such as:  Being younger than 25 years.  Being sexually active at [place] age.  Usingnonbarrier contraception.  Havingmultiple sexual partners.  Having sex with someone who has symptoms of a genital infection.  Using oral contraception. Other times, certain behaviors can increase the possibility of getting PID, such as:  Having sex during your period.  Using a vaginal douche.  Having an intrauterine device (IUD) in place. SYMPTOMS   Abdominal or pelvic pain.   Fever.   Chills.   Abnormal vaginal discharge.  Abnormal uterine bleeding.   Unusual pain shortly after finishing your period. DIAGNOSIS  Your caregiver will choose some of the following methods to make a diagnosis, such as:   Performinga physical exam and history. A pelvic exam typically reveals a very tender uterus and surrounding pelvis.   Ordering laboratory tests including a pregnancy test, blood tests, and urine test.  Orderingcultures of the vagina and cervix to check for a sexually transmitted infection (STI).  Performing an ultrasound.    Performing a laparoscopic procedure to look inside the pelvis.  TREATMENT   Antibiotic medicines may be prescribed and taken by mouth.   Sexual partners may be treated when the infection is caused by a sexually transmitted disease (STD).   Hospitalization may be needed to give antibiotics intravenously.  Surgery may be needed, but this is rare. It may take weeks until you are completely well. If you are diagnosed with PID, you should also be checked for human immunodeficiency virus (HIV). HOME CARE INSTRUCTIONS   If given, take your antibiotics as directed. Finish the medicine even if you start to feel better.   Only take over-the-counter or prescription medicines for pain, discomfort, or fever as directed by your caregiver.   Do not have sexual intercourse until treatment is completed or as directed by your caregiver. If PID is confirmed, your recent sexual partner(s) will need treatment.   Keep your follow-up appointments. SEEK MEDICAL CARE IF:   You have increased or abnormal vaginal discharge.   You need prescription medicine for your pain.   You vomit.   You cannot take your medicines.   Your partner has an STD.  SEEK IMMEDIATE MEDICAL CARE IF:   You have a fever.   You have increased abdominal or pelvic pain.   You have chills.   You have pain when you urinate.   You are not better after 72 hours following treatment.  MAKE SURE YOU:   Understand these instructions.  Will watch your condition.  Will get help right away if you are not doing well or get worse.  Document Released: 09/26/2005 Document Revised: 01/21/2013 Document Reviewed: 09/22/2011 [facility] Patient Information 2015 [place], [place]. This information is not intended to replace advice given to you by your health care provider. Make sure you discuss any questions you have with your health care provider.   Bacterial Vaginosis Bacterial vaginosis is a vaginal infection that  occurs when the normal balance of bacteria in the vagina is disrupted. It results from an overgrowth of certain bacteria. This is the most common vaginal infection in women of childbearing age. Treatment is important to prevent complications, especially in pregnant women, as it can cause a premature delivery. CAUSES  Bacterial vaginosis is caused by an increase in harmful bacteria that are normally present in smaller amounts in the vagina. Several different kinds of bacteria can cause bacterial vaginosis. However, the reason that the condition develops is not fully understood. RISK FACTORS Certain activities or behaviors can put you at an increased risk of developing bacterial vaginosis, including:  Having a new sex partner or multiple sex partners.  Douching.  Using an intrauterine device (IUD) for contraception. Women do not get bacterial vaginosis from toilet seats, bedding, swimming pools, or contact with objects around them. SIGNS AND SYMPTOMS  Some women with bacterial vaginosis have no signs or symptoms. Common symptoms include:  Grey vaginal discharge.  A fishlike odor with discharge, especially after sexual intercourse.  Itching or burning of the vagina and vulva.  Burning or pain with urination. DIAGNOSIS  Your health care provider will take a medical history and examine the vagina for signs of bacterial vaginosis. A sample of vaginal fluid may be taken. Your health care provider will look at this sample under a microscope to check for bacteria and abnormal cells. A vaginal pH test may also be done.  TREATMENT  Bacterial vaginosis may be treated with antibiotic medicines. These may be given in the form of a pill or a vaginal cream. A second round of antibiotics may be prescribed if the condition comes back after treatment.  HOME CARE INSTRUCTIONS   Only take over-the-counter or prescription medicines as directed by your health care provider.  If antibiotic medicine was  prescribed, take it as directed. Make sure you finish it even if you start to feel better.  Do not have sex until treatment is completed.  Tell all sexual partners that you have a vaginal infection. They should see their health care provider and be treated if they have problems, such as a mild rash or itching.  Practice safe sex by using condoms and only having one sex partner. SEEK MEDICAL CARE IF:   Your symptoms are not improving after 3 days of treatment.  You have increased discharge or pain.  You have a fever. MAKE SURE YOU:   Understand these instructions.  Will watch your condition.  Will get help right away if you are not doing well or get worse. FOR MORE INFORMATION  Centers for Disease Control and Prevention, Division of STD Prevention: [employer] American Sexual Health Association (ASHA): www.ashastd.org  Document Released: 09/26/2005 Document Revised: 07/17/2013 Document Reviewed: 05/08/2013 [facility] Patient Information 2015 [place], [place]. This information is not intended to replace advice given to you by your health care provider. Make sure you discuss any questions you have with your health care provider.  Doxycycline tablets or capsules What is this medicine? DOXYCYCLINE (dox i SYE kleen) is a tetracycline antibiotic. It kills certain bacteria or stops their growth. It is used to treat many kinds of infections, like dental, skin,  respiratory, and urinary tract infections. It also treats acne, Lyme disease, malaria, and certain sexually transmitted infections. This medicine may be used for other purposes; ask your health care provider or pharmacist if you have questions. COMMON BRAND NAME(S): Acticlate, Adoxa, Adoxa CK, Adoxa Pak, Adoxa TT, Alodox, Avidoxy, Doxal, Monodox, Morgidox 1x, Morgidox 1x Kit, Morgidox 2x, Morgidox 2x Kit, Ocudox, Vibra-Tabs, Vibramycin What should I tell my health care provider before I take this medicine? They need to know if you have  any of these conditions: -liver disease -long exposure to sunlight like working outdoors -stomach problems like colitis -an unusual or allergic reaction to doxycycline, tetracycline antibiotics, other medicines, foods, dyes, or preservatives -pregnant or trying to get pregnant -breast-feeding How should I use this medicine? Take this medicine by mouth with a full glass of water. Follow the directions on the prescription label. It is best to take this medicine without food, but if it upsets your stomach take it with food. Take your medicine at regular intervals. Do not take your medicine more often than directed. Take all of your medicine as directed even if you think you are better. Do not skip doses or stop your medicine early. Talk to your pediatrician regarding the use of this medicine in children. Special care may be needed. While this drug may be prescribed for children as young as 63 years old for selected conditions, precautions do apply. Overdosage: If you think you have taken too much of this medicine contact a poison control center or emergency room at once. NOTE: This medicine is only for you. Do not share this medicine with others. What if I miss a dose? If you miss a dose, take it as soon as you can. If it is almost time for your next dose, take only that dose. Do not take double or extra doses. What may interact with this medicine? -antacids -barbiturates -birth control pills -bismuth subsalicylate -carbamazepine -methoxyflurane -other antibiotics -phenytoin -vitamins that contain iron -warfarin This list may not describe all possible interactions. Give your health care provider a list of all the medicines, herbs, non-prescription drugs, or dietary supplements you use. Also tell them if you smoke, drink alcohol, or use illegal drugs. Some items may interact with your medicine. What should I watch for while using this medicine? Tell your doctor or health care professional if  your symptoms do not improve. Do not treat diarrhea with over the counter products. Contact your doctor if you have diarrhea that lasts more than 2 days or if it is severe and watery. Do not take this medicine just before going to bed. It may not dissolve properly when you lay down and can cause pain in your throat. Drink plenty of fluids while taking this medicine to also help reduce irritation in your throat. This medicine can make you more sensitive to the sun. Keep out of the sun. If you cannot avoid being in the sun, wear protective clothing and use sunscreen. Do not use sun lamps or tanning beds/booths. Birth control pills may not work properly while you are taking this medicine. Talk to your doctor about using an extra method of birth control. If you are being treated for a sexually transmitted infection, avoid sexual contact until you have finished your treatment. Your sexual partner may also need treatment. Avoid antacids, aluminum, calcium, magnesium, and iron products for 4 hours before and 2 hours after taking a dose of this medicine. If you are using this medicine to prevent malaria, you should  still protect yourself from contact with mosquitos. Stay in screened-in areas, use mosquito nets, keep your body covered, and use an insect repellent. What side effects may I notice from receiving this medicine? Side effects that you should report to your doctor or health care professional as soon as possible: -allergic reactions like skin rash, itching or hives, swelling of the face, lips, or tongue -difficulty breathing -fever -itching in the rectal or genital area -pain on swallowing -redness, blistering, peeling or loosening of the skin, including inside the mouth -severe stomach pain or cramps -unusual bleeding or bruising -unusually weak or tired -yellowing of the eyes or skin Side effects that usually do not require medical attention (report to your doctor or health care professional if  they continue or are bothersome): -diarrhea -loss of appetite -nausea, vomiting This list may not describe all possible side effects. Call your doctor for medical advice about side effects. You may report side effects to FDA at 1-800-FDA-1088. Where should I keep my medicine? Keep out of the reach of children. Store at room temperature, below 30 degrees C (86 degrees F). Protect from light. Keep container tightly closed. Throw away any unused medicine after the expiration date. Taking this medicine after the expiration date can make you seriously ill. NOTE: This sheet is a summary. It may not cover all possible information. If you have questions about this medicine, talk to your doctor, pharmacist, or health care provider.  2015, Elsevier/[employer] Standard. (2013-08-02 13:58:06)  Metronidazole tablets or capsules What is this medicine? METRONIDAZOLE (me troe NI da zole) is an antiinfective. It is used to treat certain kinds of bacterial and protozoal infections. It will not work for colds, flu, or other viral infections. This medicine may be used for other purposes; ask your health care provider or pharmacist if you have questions. COMMON BRAND NAME(S): Flagyl What should I tell my health care provider before I take this medicine? They need to know if you have any of these conditions: -anemia or other blood disorders -disease of the nervous system -fungal or yeast infection -if you drink alcohol containing drinks -liver disease -seizures -an unusual or allergic reaction to metronidazole, or other medicines, foods, dyes, or preservatives -pregnant or trying to get pregnant -breast-feeding How should I use this medicine? Take this medicine by mouth with a full glass of water. Follow the directions on the prescription label. Take your medicine at regular intervals. Do not take your medicine more often than directed. Take all of your medicine as directed even if you think you are better. Do not  skip doses or stop your medicine early. Talk to your pediatrician regarding the use of this medicine in children. Special care may be needed. Overdosage: If you think you have taken too much of this medicine contact a poison control center or emergency room at once. NOTE: This medicine is only for you. Do not share this medicine with others. What if I miss a dose? If you miss a dose, take it as soon as you can. If it is almost time for your next dose, take only that dose. Do not take double or extra doses. What may interact with this medicine? Do not take this medicine with any of the following medications: -alcohol or any product that contains alcohol -amprenavir oral solution -cisapride -disulfiram -dofetilide -dronedarone -paclitaxel injection -pimozide -ritonavir oral solution -sertraline oral solution -sulfamethoxazole-trimethoprim injection -thioridazine -ziprasidone This medicine may also interact with the following medications: -birth control pills -cimetidine -lithium -other medicines that  prolong the QT interval (cause an abnormal heart rhythm) -phenobarbital -phenytoin -warfarin This list may not describe all possible interactions. Give your health care provider a list of all the medicines, herbs, non-prescription drugs, or dietary supplements you use. Also tell them if you smoke, drink alcohol, or use illegal drugs. Some items may interact with your medicine. What should I watch for while using this medicine? Tell your doctor or health care professional if your symptoms do not improve or if they get worse. You may get drowsy or dizzy. Do not drive, use machinery, or do anything that needs mental alertness until you know how this medicine affects you. Do not stand or sit up quickly, especially if you are an older patient. This reduces the risk of dizzy or fainting spells. Avoid alcoholic drinks while you are taking this medicine and for three days afterward. Alcohol may  make you feel dizzy, sick, or flushed. If you are being treated for a sexually transmitted disease, avoid sexual contact until you have finished your treatment. Your sexual partner may also need treatment. What side effects may I notice from receiving this medicine? Side effects that you should report to your doctor or health care professional as soon as possible: -allergic reactions like skin rash or hives, swelling of the face, lips, or tongue -confusion, clumsiness -difficulty speaking -discolored or sore mouth -dizziness -fever, infection -numbness, tingling, pain or weakness in the hands or feet -trouble passing urine or change in the amount of urine -redness, blistering, peeling or loosening of the skin, including inside the mouth -seizures -unusually weak or tired -vaginal irritation, dryness, or discharge Side effects that usually do not require medical attention (report to your doctor or health care professional if they continue or are bothersome): -diarrhea -headache -irritability -metallic taste -nausea -stomach pain or cramps -trouble sleeping This list may not describe all possible side effects. Call your doctor for medical advice about side effects. You may report side effects to FDA at 1-800-FDA-1088. Where should I keep my medicine? Keep out of the reach of children. Store at room temperature below 25 degrees C (77 degrees F). Protect from light. Keep container tightly closed. Throw away any unused medicine after the expiration date. NOTE: This sheet is a summary. It may not cover all possible information. If you have questions about this medicine, talk to your doctor, pharmacist, or health care provider.  2015, Elsevier/Gold Standard. (2013-05-03 14:08:39)  Acetaminophen; Oxycodone tablets What is this medicine? ACETAMINOPHEN; OXYCODONE (a set a MEE noe fen; ox i KOE done) is a pain reliever. It is used to treat mild to moderate pain. This medicine may be used for  other purposes; ask your health care provider or pharmacist if you have questions. COMMON BRAND NAME(S): Endocet, Magnacet, Narvox, Percocet, Perloxx, Primalev, Primlev, Roxicet, Xolox What should I tell my health care provider before I take this medicine? They need to know if you have any of these conditions: -brain tumor -Crohn's disease, inflammatory bowel disease, or ulcerative colitis -drug abuse or addiction -head injury -heart or circulation problems -if you often drink alcohol -kidney disease or problems going to the bathroom -liver disease -lung disease, asthma, or breathing problems -an unusual or allergic reaction to acetaminophen, oxycodone, other opioid analgesics, other medicines, foods, dyes, or preservatives -pregnant or trying to get pregnant -breast-feeding How should I use this medicine? Take this medicine by mouth with a full glass of water. Follow the directions on the prescription label. Take your medicine at regular intervals. Do  not take your medicine more often than directed. Talk to your pediatrician regarding the use of this medicine in children. Special care may be needed. Patients over 25 years old may have a stronger reaction and need a smaller dose. Overdosage: If you think you have taken too much of this medicine contact a poison control center or emergency room at once. NOTE: This medicine is only for you. Do not share this medicine with others. What if I miss a dose? If you miss a dose, take it as soon as you can. If it is almost time for your next dose, take only that dose. Do not take double or extra doses. What may interact with this medicine? -alcohol -antihistamines -barbiturates like amobarbital, butalbital, butabarbital, methohexital, pentobarbital, phenobarbital, thiopental, and secobarbital -benztropine -drugs for bladder problems like solifenacin, trospium, oxybutynin, tolterodine, hyoscyamine, and methscopolamine -drugs for breathing problems  like ipratropium and tiotropium -drugs for certain stomach or intestine problems like propantheline, homatropine methylbromide, glycopyrrolate, atropine, belladonna, and dicyclomine -general anesthetics like etomidate, ketamine, nitrous oxide, propofol, desflurane, enflurane, halothane, isoflurane, and sevoflurane -medicines for depression, anxiety, or psychotic disturbances -medicines for sleep -muscle relaxants -naltrexone -narcotic medicines (opiates) for pain -phenothiazines like perphenazine, thioridazine, chlorpromazine, mesoridazine, fluphenazine, prochlorperazine, promazine, and trifluoperazine -scopolamine -tramadol -trihexyphenidyl This list may not describe all possible interactions. Give your health care provider a list of all the medicines, herbs, non-prescription drugs, or dietary supplements you use. Also tell them if you smoke, drink alcohol, or use illegal drugs. Some items may interact with your medicine. What should I watch for while using this medicine? Tell your doctor or health care professional if your pain does not go away, if it gets worse, or if you have new or a different type of pain. You may develop tolerance to the medicine. Tolerance means that you will need a higher dose of the medication for pain relief. Tolerance is normal and is expected if you take this medicine for a long time. Do not suddenly stop taking your medicine because you may develop a severe reaction. Your body becomes used to the medicine. This does NOT mean you are addicted. Addiction is a behavior related to getting and using a drug for a non-medical reason. If you have pain, you have a medical reason to take pain medicine. Your doctor will tell you how much medicine to take. If your doctor wants you to stop the medicine, the dose will be slowly lowered over time to avoid any side effects. You may get drowsy or dizzy. Do not drive, use machinery, or do anything that needs mental alertness until you  know how this medicine affects you. Do not stand or sit up quickly, especially if you are an older patient. This reduces the risk of dizzy or fainting spells. Alcohol may interfere with the effect of this medicine. Avoid alcoholic drinks. There are different types of narcotic medicines (opiates) for pain. If you take more than one type at the same time, you may have more side effects. Give your health care provider a list of all medicines you use. Your doctor will tell you how much medicine to take. Do not take more medicine than directed. Call emergency for help if you have problems breathing. The medicine will cause constipation. Try to have a bowel movement at least every 2 to 3 days. If you do not have a bowel movement for 3 days, call your doctor or health care professional. Do not take Tylenol (acetaminophen) or medicines that have acetaminophen with  this medicine. Too much acetaminophen can be very dangerous. Many nonprescription medicines contain acetaminophen. Always read the labels carefully to avoid taking more acetaminophen. What side effects may I notice from receiving this medicine? Side effects that you should report to your doctor or health care professional as soon as possible: -allergic reactions like skin rash, itching or hives, swelling of the face, lips, or tongue -breathing difficulties, wheezing -confusion -light headedness or fainting spells -severe stomach pain -unusually weak or tired -yellowing of the skin or the whites of the eyes Side effects that usually do not require medical attention (report to your doctor or health care professional if they continue or are bothersome): -dizziness -drowsiness -nausea -vomiting This list may not describe all possible side effects. Call your doctor for medical advice about side effects. You may report side effects to FDA at 1-800-FDA-1088. Where should I keep my medicine? Keep out of the reach of children. This medicine can be  abused. Keep your medicine in a safe place to protect it from theft. Do not share this medicine with anyone. Selling or giving away this medicine is dangerous and against the law. Store at room temperature between 20 and 25 degrees C (68 and 77 degrees F). Keep container tightly closed. Protect from light. This medicine may cause accidental overdose and death if it is taken by other adults, children, or pets. Flush any unused medicine down the toilet to reduce the chance of harm. Do not use the medicine after the expiration date. NOTE: This sheet is a summary. It may not cover all possible information. If you have questions about this medicine, talk to your doctor, pharmacist, or health care provider.  2015, Elsevier/Gold Standard. (2013-05-20 13:17:35)

## 2014-07-22 NOTE — ED Provider Notes (Signed)
CSN: [id number]     Arrival date & time 07/21/14  2315 History   First MD Initiated Contact with Patient 07/22/14 0234     Chief Complaint  Patient presents with  . Pelvic Pain     (Consider location/radiation/quality/duration/timing/severity/associated sxs/prior Treatment) Patient is a 23 y.o. female presenting with pelvic pain. The history is provided by the patient.  Pelvic Pain  She has been having pelvic pain for the last 4 days. It started on the left side but now is on the right side. It is a dull ache which at times becomes very sharp. Today, her pain got significantly worse. Pain can get as severe as 9/10. It is worse when she stretches and better if she soaks in hot water. She has taken ibuprofen with no relief. There is no radiation of pain to the back or to the mid or upper abdomen. She denies fever or chills but did break out in sweats today. She denies urinary urgency, frequency, tenesmus, dysuria. She denies nausea or vomiting. She denies vaginal discharge. Last menses was September 14 and was normal. She did have one similar episode which was not as severe and was sent for an ultrasound which was negative, but ultrasound actually was not obtained until 3 months after symptoms had resolved. She states that she is taking a natural treatment for infertility and that has no hormones in it.  Past Medical History  Diagnosis Date  . Acute URI   . Asthma    History reviewed. No pertinent past surgical history. History reviewed. No pertinent family history. History  Substance Use Topics  . Smoking status: Current Every Day Smoker -- 1.00 packs/day  . Smokeless tobacco: Not on file  . Alcohol Use: No   OB History   Grav Para Term Preterm Abortions TAB SAB Ect Mult Living                 Review of Systems  Genitourinary: Positive for pelvic pain.  All other systems reviewed and are negative.     Allergies  Review of patient's allergies indicates no known  allergies.  Home Medications   Prior to Admission medications   Medication Sig Start Date End Date Taking? Authorizing Provider  albuterol (PROVENTIL HFA;VENTOLIN HFA) 108 (90 BASE) MCG/ACT inhaler Inhale 2 puffs into the lungs every 4 (four) hours as needed for wheezing or shortness of breath. 05/03/14  Yes John L Molpus, MD  cephALEXin (KEFLEX) 500 MG capsule Take 1 capsule (500 mg total) by mouth 2 (two) times daily. 03/28/14   Enid Skeens, MD  nitrofurantoin, macrocrystal-monohydrate, (MACROBID) 100 MG capsule Take 1 capsule (100 mg total) by mouth 2 (two) times daily. X 7 days 11/16/13   Carlisle Beers Molpus, MD  traMADol (ULTRAM) 50 MG tablet Take 1 tablet (50 mg total) by mouth every 6 (six) hours as needed. 04/07/14   Teressa Lower, NP   BP 115/68  Pulse 75  Temp(Src) 98 F (36.7 C) (Oral)  Resp 14  Ht 5\' 8"  (1.727 m)  Wt 152 lb (68.947 kg)  BMI 23.12 kg/m2  SpO2 98%  LMP 06/23/2014 Physical Exam  Nursing note and vitals reviewed.  23 year old female, resting comfortably and in no acute distress. Vital signs are normal. Oxygen saturation is 98%, which is normal. Head is normocephalic and atraumatic. PERRLA, EOMI. Oropharynx is clear. Neck is nontender and supple without adenopathy or JVD. Back is nontender and there is no CVA tenderness. Lungs are clear without rales,  wheezes, or rhonchi. Chest is nontender. Heart has regular rate and rhythm without murmur. Abdomen is soft, flat, with some mild tenderness in the right lower abdomen. There is no rebound or guarding. There are no masses or hepatosplenomegaly and peristalsis is normoactive. Pelvic: Normal external female genitalia. Cervix is closed but with a small amount of greenish discharge present. Fundus is top normal in size. There is moderate cervical motion tenderness and diffuse bilateral adnexal tenderness. No adnexal masses were felt. Palpation in the pelvic region does reproduce her pain. Extremities have no cyanosis or  edema, full range of motion is present. Skin is warm and dry without rash. Neurologic: Mental status is normal, cranial nerves are intact, there are no motor or sensory deficits.  ED Course  Procedures (including critical care time) Labs Review Results for orders placed during the hospital encounter of 07/22/14  WET PREP, GENITAL      Result Value Ref Range   Yeast Wet Prep HPF POC NONE SEEN  NONE SEEN   Trich, Wet Prep NONE SEEN  NONE SEEN   Clue Cells Wet Prep HPF POC FEW (*) NONE SEEN   WBC, Wet Prep HPF POC TOO NUMEROUS TO COUNT (*) NONE SEEN  PREGNANCY, URINE      Result Value Ref Range   Preg Test, Ur NEGATIVE  NEGATIVE  URINALYSIS, ROUTINE W REFLEX MICROSCOPIC      Result Value Ref Range   Color, Urine YELLOW  YELLOW   APPearance CLEAR  CLEAR   Specific Gravity, Urine 1.033 (*) 1.005 - 1.030   pH 6.0  5.0 - 8.0   Glucose, UA NEGATIVE  NEGATIVE mg/dL   Hgb urine dipstick NEGATIVE  NEGATIVE   Bilirubin Urine NEGATIVE  NEGATIVE   Ketones, ur NEGATIVE  NEGATIVE mg/dL   Protein, ur NEGATIVE  NEGATIVE mg/dL   Urobilinogen, UA 0.2  0.0 - 1.0 mg/dL   Nitrite NEGATIVE  NEGATIVE   Leukocytes, UA NEGATIVE  NEGATIVE  CBC WITH DIFFERENTIAL      Result Value Ref Range   WBC 7.1  4.0 - 10.5 K/uL   RBC 4.04  3.87 - 5.11 MIL/uL   Hemoglobin 12.9  12.0 - 15.0 g/dL   HCT 09.837.3  11.936.0 - 14.746.0 %   MCV 92.3  78.0 - 100.0 fL   MCH 31.9  26.0 - 34.0 pg   MCHC 34.6  30.0 - 36.0 g/dL   RDW 82.912.2  56.211.5 - 13.015.5 %   Platelets 240  150 - 400 K/uL   Neutrophils Relative % 45  43 - 77 %   Neutro Abs 3.3  1.7 - 7.7 K/uL   Lymphocytes Relative 40  12 - 46 %   Lymphs Abs 2.8  0.7 - 4.0 K/uL   Monocytes Relative 8  3 - 12 %   Monocytes Absolute 0.5  0.1 - 1.0 K/uL   Eosinophils Relative 6 (*) 0 - 5 %   Eosinophils Absolute 0.4  0.0 - 0.7 K/uL   Basophils Relative 1  0 - 1 %   Basophils Absolute 0.1  0.0 - 0.1 K/uL  COMPREHENSIVE METABOLIC PANEL      Result Value Ref Range   Sodium 141  137 - 147  mEq/L   Potassium 4.2  3.7 - 5.3 mEq/L   Chloride 104  96 - 112 mEq/L   CO2 26  19 - 32 mEq/L   Glucose, Bld 99  70 - 99 mg/dL   BUN 14  6 - 23  mg/dL   Creatinine, Ser 1.610.70  0.50 - 1.10 mg/dL   Calcium 9.3  8.4 - 09.610.5 mg/dL   Total Protein 7.1  6.0 - 8.3 g/dL   Albumin 4.1  3.5 - 5.2 g/dL   AST 12  0 - 37 U/L   ALT 10  0 - 35 U/L   Alkaline Phosphatase 41  39 - 117 U/L   Total Bilirubin 0.3  0.3 - 1.2 mg/dL   GFR calc non Af Amer >90  >90 mL/min   GFR calc Af Amer >90  >90 mL/min   Anion gap 11  5 - 15  LIPASE, BLOOD      Result Value Ref Range   Lipase 35  11 - 59 U/L   MDM   Final diagnoses:  Pelvic inflammatory disease  Bacterial vaginosis    Pelvic pain of uncertain cause. Her records are reviewed and she had an ED visit in June for right lower quadrant pain with a negative CT scan. This may indeed be an ovarian cyst. Pelvic exam needs to be completed. Ultrasound is not available at this time but says screening labs will be obtained.  Pelvic exam is consistent with pelvic inflammatory disease. On review of her prior visit, it is noted that she did have cervical motion tenderness at that time although she is not felt to have PID. Patient also states that she has been screened for STD recently and is negative. However, clinical picture is certainly consistent with PID and she will be treated with ceftriaxone and doxycycline and is referred to women's clinic for followup. She'll also be given a prescription for oxycodone-acetaminophen for pain.  But that has come back with a few clue cells. Metronidazole will be added to her antibiotic regimen. Curiously, patient states that several months ago, both she and her boyfriend were treated for STDs even though they were culture negative. Importance of followup at the women's clinic post-rest to make sure she has adequate clinical response to antibiotics and consider additional workup for alternate causes for her pelvic pain.  Dione Boozeavid  Aniqa Hare, MD 07/22/14 [phone]

## 2014-07-23 LAB — GC/CHLAMYDIA PROBE AMP
CT Probe RNA: NEGATIVE
GC Probe RNA: NEGATIVE

## 2014-08-25 ENCOUNTER — Encounter: Payer: Self-pay | Admitting: Family Medicine

## 2014-08-25 ENCOUNTER — Ambulatory Visit (INDEPENDENT_AMBULATORY_CARE_PROVIDER_SITE_OTHER): Payer: Medicaid Other | Admitting: Family Medicine

## 2014-08-25 VITALS — BP 117/82 | HR 81 | Temp 98.1°F | Ht 68.0 in | Wt 149.8 lb

## 2014-08-25 DIAGNOSIS — R102 Pelvic and perineal pain: Secondary | ICD-10-CM

## 2014-08-25 DIAGNOSIS — N979 Female infertility, unspecified: Secondary | ICD-10-CM

## 2014-08-25 NOTE — Progress Notes (Signed)
   Subjective:    Patient ID: Amy Little, female    DOB: [birth year], 23 y.o.   MRN: [id number]  HPI Patient referred by ED for follow up of pelvic pain.  Was seen in the ED about 1 month ago for pelvic pain and treated for PID with ceftriaxone and doxycycline.  She was also diagnosed with BV and given metronidazole.  She completed the course and has been pain free since that time.  She does complain of infertility - difficulty getting pregnant despite trying for a year.  Her current partner has 60% infertility.  GYN history 30 day menstrual periods with 4 days of moderate flow. Does have history of abnormal PAP with colposcopy at age 23, but has had normal PAPs since that time Has been treated for GC/CT a couple times in the past.    Review of Systems  Constitutional: Negative for fever, chills and fatigue.  Gastrointestinal: Negative for nausea, vomiting, abdominal pain, diarrhea and constipation.  Genitourinary: Negative for dysuria, urgency, vaginal bleeding, vaginal discharge, vaginal pain and pelvic pain.       Objective:   Physical Exam  Constitutional: She is oriented to person, place, and time. She appears well-developed and well-nourished.  HENT:  Head: Normocephalic and atraumatic.  Pulmonary/Chest: Effort normal and breath sounds normal. No respiratory distress. She has no wheezes. She has no rales. She exhibits no tenderness.  Abdominal: Soft. Bowel sounds are normal. She exhibits no distension and no mass. There is no tenderness. There is no rebound and no guarding.  Genitourinary: There is no rash, tenderness, lesion or injury on the right labia. There is no rash, tenderness, lesion or injury on the left labia. Uterus is not deviated, not enlarged, not fixed and not tender. Cervix exhibits no motion tenderness, no discharge and no friability. Right adnexum displays no mass, no tenderness and no fullness. Left adnexum displays no mass, no tenderness and no fullness. No  erythema, tenderness or bleeding in the vagina. No foreign body around the vagina. No signs of injury around the vagina. No vaginal discharge found.  Neurological: She is alert and oriented to person, place, and time.  Skin: Skin is warm and dry.  Psychiatric: She has a normal mood and affect. Her behavior is normal. Judgment and thought content normal.       Assessment & Plan:   Problem List Items Addressed This Visit    None    Visit Diagnoses    Pelvic pain in female    -  Primary    Infertility, female          Pelvic pain greatly improved - no current pain.  Possible cyst related as no evidence of GC/CT on cultures despite being treated for PID.  Return PRN.  Discussed with patient that insurance will not pay for infertility workup.  Infertility likely due to partner.

## 2014-09-13 ENCOUNTER — Encounter (HOSPITAL_BASED_OUTPATIENT_CLINIC_OR_DEPARTMENT_OTHER): Payer: Self-pay | Admitting: Emergency Medicine

## 2014-09-13 ENCOUNTER — Emergency Department (HOSPITAL_BASED_OUTPATIENT_CLINIC_OR_DEPARTMENT_OTHER)
Admission: EM | Admit: 2014-09-13 | Discharge: 2014-09-13 | Disposition: A | Discharge status: Home / Self Care | Payer: Medicaid Other | Attending: Emergency Medicine | Admitting: Emergency Medicine

## 2014-09-13 ENCOUNTER — Emergency Department (HOSPITAL_BASED_OUTPATIENT_CLINIC_OR_DEPARTMENT_OTHER): Payer: Medicaid Other

## 2014-09-13 DIAGNOSIS — Z720 Tobacco use: Secondary | ICD-10-CM | POA: Insufficient documentation

## 2014-09-13 DIAGNOSIS — R220 Localized swelling, mass and lump, head: Secondary | ICD-10-CM

## 2014-09-13 DIAGNOSIS — J45909 Unspecified asthma, uncomplicated: Secondary | ICD-10-CM | POA: Insufficient documentation

## 2014-09-13 DIAGNOSIS — L0201 Cutaneous abscess of face: Secondary | ICD-10-CM | POA: Diagnosis not present

## 2014-09-13 DIAGNOSIS — F419 Anxiety disorder, unspecified: Secondary | ICD-10-CM | POA: Diagnosis not present

## 2014-09-13 DIAGNOSIS — Z79899 Other long term (current) drug therapy: Secondary | ICD-10-CM | POA: Diagnosis not present

## 2014-09-13 DIAGNOSIS — Z792 Long term (current) use of antibiotics: Secondary | ICD-10-CM | POA: Diagnosis not present

## 2014-09-13 LAB — CBC WITH DIFFERENTIAL/PLATELET
BASOS PCT: 0 % (ref 0–1)
Basophils Absolute: 0 10*3/uL (ref 0.0–0.1)
EOS PCT: 4 % (ref 0–5)
Eosinophils Absolute: 0.4 10*3/uL (ref 0.0–0.7)
HEMATOCRIT: 34.7 % — AB (ref 36.0–46.0)
Hemoglobin: 11.5 g/dL — ABNORMAL LOW (ref 12.0–15.0)
LYMPHS PCT: 33 % (ref 12–46)
Lymphs Abs: 2.8 10*3/uL (ref 0.7–4.0)
MCH: 31.9 pg (ref 26.0–34.0)
MCHC: 33.1 g/dL (ref 30.0–36.0)
MCV: 96.1 fL (ref 78.0–100.0)
MONO ABS: 0.9 10*3/uL (ref 0.1–1.0)
Monocytes Relative: 10 % (ref 3–12)
Neutro Abs: 4.4 10*3/uL (ref 1.7–7.7)
Neutrophils Relative %: 53 % (ref 43–77)
PLATELETS: 179 10*3/uL (ref 150–400)
RBC: 3.61 MIL/uL — ABNORMAL LOW (ref 3.87–5.11)
RDW: 12.2 % (ref 11.5–15.5)
WBC: 8.6 10*3/uL (ref 4.0–10.5)

## 2014-09-13 LAB — BASIC METABOLIC PANEL
Anion gap: 8 (ref 5–15)
BUN: 6 mg/dL (ref 6–23)
CALCIUM: 8.8 mg/dL (ref 8.4–10.5)
CO2: 29 mEq/L (ref 19–32)
CREATININE: 0.7 mg/dL (ref 0.50–1.10)
Chloride: 104 mEq/L (ref 96–112)
GFR calc Af Amer: >90 mL/min (ref >90)
GLUCOSE: 87 mg/dL (ref 70–99)
Potassium: 4.4 mEq/L (ref 3.7–5.3)
SODIUM: 141 meq/L (ref 137–147)

## 2014-09-13 MED ORDER — IOHEXOL 300 MG/ML  SOLN
75.0000 mL | Freq: Once | INTRAMUSCULAR | Status: AC | PRN
  Administered 2014-09-13: 75 mL via INTRAVENOUS

## 2014-09-13 MED ORDER — HYDROCODONE-ACETAMINOPHEN 5-325 MG PO TABS: 1.0000 | ORAL_TABLET | ORAL | Status: DC | PRN

## 2014-09-13 MED ORDER — MORPHINE SULFATE 4 MG/ML IJ SOLN
INTRAMUSCULAR | Status: AC
Start: 2014-09-13 — End: 2014-09-13
  Filled 2014-09-13: qty 2

## 2014-09-13 MED ORDER — SODIUM CHLORIDE 0.9 % IV BOLUS (SEPSIS)
1000.0000 mL | Freq: Once | INTRAVENOUS | Status: AC
  Administered 2014-09-13: 1000 mL via INTRAVENOUS

## 2014-09-13 MED ORDER — CLINDAMYCIN HCL 150 MG PO CAPS: 450.0000 mg | ORAL_CAPSULE | Freq: Four times a day (QID) | ORAL | Status: DC

## 2014-09-13 MED ORDER — MORPHINE SULFATE 4 MG/ML IJ SOLN
8.0000 mg | Freq: Once | INTRAMUSCULAR | Status: AC
  Administered 2014-09-13: 8 mg via INTRAVENOUS

## 2014-09-13 MED ORDER — MORPHINE SULFATE 4 MG/ML IJ SOLN
4.0000 mg | Freq: Once | INTRAMUSCULAR | Status: AC
  Administered 2014-09-13: 4 mg via INTRAVENOUS
  Filled 2014-09-13: qty 1

## 2014-09-13 MED ORDER — CLINDAMYCIN PHOSPHATE 600 MG/50ML IV SOLN
600.0000 mg | Freq: Once | INTRAVENOUS | Status: AC
  Administered 2014-09-13: 600 mg via INTRAVENOUS
  Filled 2014-09-13: qty 50

## 2014-09-13 NOTE — ED Provider Notes (Signed)
CSN: [id number]     Arrival date & time 09/13/14  1209 History   First MD Initiated Contact with Patient 09/13/14 1236     Chief Complaint  Patient presents with  . Facial Swelling     (Consider location/radiation/quality/duration/timing/severity/associated sxs/prior Treatment) HPI  23 year old female presents with right-sided facial swelling and pain. 5 days ago she had all 4 wisdom teeth removed as well as a growth on her right cheek. Since then she's been having right sided swelling. Swelling is now on her right neck as well. She's been having trouble swallowing, mostly due to pain. No fevers or chills. Pain is not well controlled with the hydrocodone from her surgeon. She is most concerned that she has an infection. Denies any drainage or bleeding in her mouth.  Past Medical History  Diagnosis Date  . Acute URI   . Asthma    History reviewed. No pertinent past surgical history. No family history on file. History  Substance Use Topics  . Smoking status: Current Every Day Smoker -- 1.00 packs/day  . Smokeless tobacco: Not on file  . Alcohol Use: No   OB History    No data available     Review of Systems  Constitutional: Negative for fever.  HENT: Positive for dental problem and facial swelling. Negative for voice change.   Gastrointestinal: Negative for vomiting.  All other systems reviewed and are negative.     Allergies  Review of patient's allergies indicates no known allergies.  Home Medications   Prior to Admission medications   Medication Sig Start Date End Date Taking? Authorizing Provider  HYDROcodone-acetaminophen (NORCO/VICODIN) 5-325 MG per tablet Take 2 tablets by mouth every 4 (four) hours as needed for moderate pain.   Yes Historical Provider, MD  ibuprofen (ADVIL,MOTRIN) 800 MG tablet Take 800 mg by mouth every 6 (six) hours as needed.   Yes Historical Provider, MD  albuterol (PROVENTIL HFA;VENTOLIN HFA) 108 (90 BASE) MCG/ACT inhaler Inhale 2 puffs  into the lungs every 4 (four) hours as needed for wheezing or shortness of breath. 05/03/14   Carlisle BeersJohn L Molpus, MD  cephALEXin (KEFLEX) 500 MG capsule Take 1 capsule (500 mg total) by mouth 2 (two) times daily. 03/28/14   Enid SkeensJoshua M Zavitz, MD  doxycycline (VIBRAMYCIN) 100 MG capsule Take 1 capsule (100 mg total) by mouth 2 (two) times daily. 07/22/14   Dione Boozeavid Glick, MD  metroNIDAZOLE (FLAGYL) 500 MG tablet Take 1 tablet (500 mg total) by mouth 2 (two) times daily. 07/22/14   Dione Boozeavid Glick, MD  nitrofurantoin, macrocrystal-monohydrate, (MACROBID) 100 MG capsule Take 1 capsule (100 mg total) by mouth 2 (two) times daily. X 7 days 11/16/13   Hanley SeamenJohn L Molpus, MD  oxyCODONE-acetaminophen (PERCOCET) 5-325 MG per tablet Take 1 tablet by mouth every 4 (four) hours as needed for moderate pain. 07/22/14   Dione Boozeavid Glick, MD  traMADol (ULTRAM) 50 MG tablet Take 1 tablet (50 mg total) by mouth every 6 (six) hours as needed. 04/07/14   Teressa LowerVrinda Pickering, NP   BP 128/71 mmHg  Pulse 83  Temp(Src) 98.3 F (36.8 C) (Oral)  Resp 18  Ht 5\' 8"  (1.727 m)  Wt 145 lb (65.772 kg)  BMI 22.05 kg/m2  SpO2 100%  LMP 08/29/2014 Physical Exam  Constitutional: She is oriented to person, place, and time. She appears well-developed and well-nourished.  HENT:  Head: Normocephalic and atraumatic.    Right Ear: External ear normal.  Left Ear: External ear normal.  Nose: Nose normal.  Mouth/Throat:  Mild trimsus that seems mostly due to pain and anxiety on exam. Difficult to assess oropharynx but no obvious bleeding/discharge  Eyes: Right eye exhibits no discharge. Left eye exhibits no discharge.  Neck: Neck supple.  Cardiovascular: Normal rate, regular rhythm and normal heart sounds.   Pulmonary/Chest: Effort normal and breath sounds normal.  Neurological: She is alert and oriented to person, place, and time.  Skin: Skin is warm and dry.  Vitals reviewed.   ED Course  Procedures (including critical care time) Labs  Review Labs Reviewed  CBC WITH DIFFERENTIAL - Abnormal; Notable for the following:    RBC 3.61 (*)    Hemoglobin 11.5 (*)    HCT 34.7 (*)    All other components within normal limits  BASIC METABOLIC PANEL    Imaging Review Ct Maxillofacial W/cm  09/13/2014   CLINICAL DATA:  Right facial swelling  EXAM: CT MAXILLOFACIAL WITH CONTRAST  TECHNIQUE: Multidetector CT imaging of the maxillofacial structures was performed with intravenous contrast. Multiplanar CT image reconstructions were also generated. A small metallic BB was placed on the right temple in order to reliably differentiate right from left.  CONTRAST:  75mL OMNIPAQUE IOHEXOL 300 MG/ML  SOLN  COMPARISON:  None.  FINDINGS: Postoperative changes from bilateral third molar upper and lower removal are present. At the operative bed in the you right lower third molar, the medial mandible a slightly fragmented which is expected postoperative change. There are air bubbles within the socket extending into the adjacent soft tissues. Adjacent to the right side of the anterior mandible, within the adjacent soft tissues, there are air bubbles and stranding. This area measures 2.1 x 0.7 x 1.1 cm. This may represent a postoperative gas and fluid collection however developing abscess is not excluded. There is a similar fluid collection overlying the left side of the mandible on image 26 which is tiny.  Airway is patent. Vascular structures are grossly patent. Mastoid air cells and visualized paranasal sinuses are clear. Globes are intact.  IMPRESSION: 2.1 x 0.7 x 1.1 cm gas and fluid collection adjacent to the right side of the mandible in the soft tissues as described. This may represent postoperative changes however developing abscess is not excluded similar tiny collection over the left side of the mandible is also present.   Electronically Signed   By: Maryclare BeanArt  Hoss M.D.   On: 09/13/2014 13:50     EKG Interpretation None      MDM   Final diagnoses:   Right facial swelling  Abscess of face    CT is consistent with abscess versus postop fluid collection. She did have a biopsy taken of the right side which is the larger area of swelling. This could be where the air-fluid levels are coming from. Her pain was controlled with IV narcotics, she was given one dose of IV antibiotics. At this point will treat like a possible abscess and recommend follow-up with her oral surgeon in 2 days (monday). Does not appear septic and is able to take PO.  Audree CamelScott T Masae Lukacs, MD 09/13/14 [phone]

## 2014-09-13 NOTE — ED Notes (Signed)
MD at bedside. 

## 2014-09-13 NOTE — Discharge Instructions (Signed)
Abscess °An abscess is an infected area that contains a collection of pus and debris. It can occur in almost any part of the body. An abscess is also known as a furuncle or boil. °CAUSES  °An abscess occurs when tissue gets infected. This can occur from blockage of oil or sweat glands, infection of hair follicles, or a minor injury to the skin. As the body tries to fight the infection, pus collects in the area and creates pressure under the skin. This pressure causes pain. People with weakened immune systems have difficulty fighting infections and get certain abscesses more often.  °SYMPTOMS °Usually an abscess develops on the skin and becomes a painful mass that is red, warm, and tender. If the abscess forms under the skin, you may feel a moveable soft area under the skin. Some abscesses break open (rupture) on their own, but most will continue to get worse without care. The infection can spread deeper into the body and eventually into the bloodstream, causing you to feel ill.  °DIAGNOSIS  °Your caregiver will take your medical history and perform a physical exam. A sample of fluid may also be taken from the abscess to determine what is causing your infection. °TREATMENT  °Your caregiver may prescribe antibiotic medicines to fight the infection. However, taking antibiotics alone usually does not cure an abscess. Your caregiver may need to make a small cut (incision) in the abscess to drain the pus. In some cases, gauze is packed into the abscess to reduce pain and to continue draining the area. °HOME CARE INSTRUCTIONS  °· Only take over-the-counter or prescription medicines for pain, discomfort, or fever as directed by your caregiver. °· If you were prescribed antibiotics, take them as directed. Finish them even if you start to feel better. °· If gauze is used, follow your caregiver's directions for changing the gauze. °· To avoid spreading the infection: °· Keep your draining abscess covered with a  bandage. °· Wash your hands well. °· Do not share personal care items, towels, or whirlpools with others. °· Avoid skin contact with others. °· Keep your skin and clothes clean around the abscess. °· Keep all follow-up appointments as directed by your caregiver. °SEEK MEDICAL CARE IF:  °· You have increased pain, swelling, redness, fluid drainage, or bleeding. °· You have muscle aches, chills, or a general ill feeling. °· You have a fever. °MAKE SURE YOU:  °· Understand these instructions. °· Will watch your condition. °· Will get help right away if you are not doing well or get worse. °Document Released: 07/06/2005 Document Revised: 03/27/2012 Document Reviewed: 12/09/2011 °[employer]® Patient Information ©2015 [employer]. This information is not intended to replace advice given to you by your health care provider. Make sure you discuss any questions you have with your health care provider. ° °Facial Infection °You have an infection of your face. This requires special attention to help prevent serious problems. Infections in facial wounds can cause poor healing and scars. They can also spread to deeper tissues, especially around the eye. Wound and dental infections can lead to sinusitis, infection of the eye socket, and even meningitis. Permanent damage to the skin, eye, and nervous system may result if facial infections are not treated properly. With severe infections, hospital care for IV antibiotic injections may be needed if they don't respond to oral antibiotics. °Antibiotics must be taken for the full course to insure the infection is eliminated. If the infection came from a bad tooth, it may have to be   extracted when the infection is under control. Warm compresses may be applied to reduce skin irritation and remove drainage. °You might need a tetanus shot now if: °· You cannot remember when your last tetanus shot was. °· You have never had a tetanus shot. °· The object that caused your wound was dirty. °If  you need a tetanus shot, and you decide not to get one, there is a rare chance of getting tetanus. Sickness from tetanus can be serious. If you got a tetanus shot, your arm may swell, get red and warm to the touch at the shot site. This is common and not a problem. °SEEK IMMEDIATE MEDICAL CARE IF:  °· You have increased swelling, redness, or trouble breathing. °· You have a severe headache, dizziness, nausea, or vomiting. °· You develop problems with your eyesight. °· You have a fever. °Document Released: 11/03/2004 Document Revised: 12/19/2011 Document Reviewed: 09/26/2005 °[employer]® Patient Information ©2015 [employer]. This information is not intended to replace advice given to you by your health care provider. Make sure you discuss any questions you have with your health care provider. ° °

## 2014-09-13 NOTE — ED Notes (Signed)
Will discharge after IV antibiotic

## 2014-09-13 NOTE — ED Notes (Signed)
Pt had wisdom teeth cut out Wednesday and states the swelling is worse. Pt is concerned because she was not given any antibiotics.

## 2014-11-12 ENCOUNTER — Encounter (HOSPITAL_BASED_OUTPATIENT_CLINIC_OR_DEPARTMENT_OTHER): Payer: Self-pay | Admitting: *Deleted

## 2014-11-12 ENCOUNTER — Emergency Department (HOSPITAL_BASED_OUTPATIENT_CLINIC_OR_DEPARTMENT_OTHER)
Admission: EM | Admit: 2014-11-12 | Discharge: 2014-11-12 | Disposition: A | Discharge status: Home / Self Care | Payer: Medicaid Other | Attending: Emergency Medicine | Admitting: Emergency Medicine

## 2014-11-12 ENCOUNTER — Emergency Department (HOSPITAL_BASED_OUTPATIENT_CLINIC_OR_DEPARTMENT_OTHER): Payer: Medicaid Other

## 2014-11-12 DIAGNOSIS — O200 Threatened abortion: Secondary | ICD-10-CM | POA: Diagnosis not present

## 2014-11-12 DIAGNOSIS — O99511 Diseases of the respiratory system complicating pregnancy, first trimester: Secondary | ICD-10-CM | POA: Insufficient documentation

## 2014-11-12 DIAGNOSIS — Z79899 Other long term (current) drug therapy: Secondary | ICD-10-CM | POA: Insufficient documentation

## 2014-11-12 DIAGNOSIS — Z792 Long term (current) use of antibiotics: Secondary | ICD-10-CM | POA: Diagnosis not present

## 2014-11-12 DIAGNOSIS — J45909 Unspecified asthma, uncomplicated: Secondary | ICD-10-CM | POA: Insufficient documentation

## 2014-11-12 DIAGNOSIS — Z87891 Personal history of nicotine dependence: Secondary | ICD-10-CM | POA: Insufficient documentation

## 2014-11-12 DIAGNOSIS — Z3A01 Less than 8 weeks gestation of pregnancy: Secondary | ICD-10-CM | POA: Diagnosis not present

## 2014-11-12 DIAGNOSIS — O209 Hemorrhage in early pregnancy, unspecified: Secondary | ICD-10-CM | POA: Diagnosis present

## 2014-11-12 DIAGNOSIS — Z3490 Encounter for supervision of normal pregnancy, unspecified, unspecified trimester: Secondary | ICD-10-CM

## 2014-11-12 DIAGNOSIS — N939 Abnormal uterine and vaginal bleeding, unspecified: Secondary | ICD-10-CM

## 2014-11-12 LAB — HCG, QUANTITATIVE, PREGNANCY: hCG, Beta Chain, Quant, S: 1004 m[IU]/mL — ABNORMAL HIGH (ref <5)

## 2014-11-12 LAB — URINALYSIS, ROUTINE W REFLEX MICROSCOPIC
Bilirubin Urine: NEGATIVE
Glucose, UA: NEGATIVE mg/dL
Hgb urine dipstick: NEGATIVE
Ketones, ur: NEGATIVE mg/dL
LEUKOCYTES UA: NEGATIVE
NITRITE: NEGATIVE
PH: 7 (ref 5.0–8.0)
Protein, ur: NEGATIVE mg/dL
SPECIFIC GRAVITY, URINE: 1.018 (ref 1.005–1.030)
UROBILINOGEN UA: 0.2 mg/dL (ref 0.0–1.0)

## 2014-11-12 LAB — WET PREP, GENITAL
CLUE CELLS WET PREP: NONE SEEN
Trich, Wet Prep: NONE SEEN
Yeast Wet Prep HPF POC: NONE SEEN

## 2014-11-12 NOTE — Discharge Instructions (Signed)

## 2014-11-12 NOTE — ED Notes (Signed)
RLQ pain since last night- Vaginal bleeding started last night but none currently- pos preg test 3-4 days ago

## 2014-11-12 NOTE — ED Provider Notes (Signed)
CSN: [id number]     Arrival date & time 11/12/14  1104 History   First MD Initiated Contact with Patient 11/12/14 1118     Chief Complaint  Patient presents with  . Vaginal Bleeding     (Consider location/radiation/quality/duration/timing/severity/associated sxs/prior Treatment) Patient is a 24 y.o. female presenting with vaginal bleeding. The history is provided by the patient.  Vaginal Bleeding Quality:  Bright red and typical of menses Severity:  Mild Onset quality:  Sudden Timing:  Intermittent Progression:  Unchanged Chronicity:  New Menstrual history:  Regular Possible pregnancy: yes   Context: spontaneously   Relieved by:  Nothing Worsened by:  Nothing tried Associated symptoms: vaginal discharge (mild this morning, more than normal)   Associated symptoms: no abdominal pain, no back pain, no dysuria, no fatigue, no fever and no nausea   Risk factors: PID (one month ago) and unprotected sex   Risk factors: no hx of ectopic pregnancy, no gynecological surgery, no prior miscarriage and no terminated pregnancies     Past Medical History  Diagnosis Date  . Acute URI   . Asthma    Past Surgical History  Procedure Laterality Date  . Wisdom tooth extraction     No family history on file. History  Substance Use Topics  . Smoking status: Former Smoker -- 1.00 packs/day    Quit date: 11/09/2014  . Smokeless tobacco: Never Used  . Alcohol Use: Yes     Comment: 1 beer/month   OB History    No data available     Review of Systems  Constitutional: Negative for fever and fatigue.  Gastrointestinal: Negative for nausea and abdominal pain.  Genitourinary: Positive for vaginal bleeding and vaginal discharge (mild this morning, more than normal). Negative for dysuria.  Musculoskeletal: Negative for back pain.  All other systems reviewed and are negative.     Allergies  Review of patient's allergies indicates no known allergies.  Home Medications   Prior to  Admission medications   Medication Sig Start Date End Date Taking? Authorizing Provider  albuterol (PROVENTIL HFA;VENTOLIN HFA) 108 (90 BASE) MCG/ACT inhaler Inhale 2 puffs into the lungs every 4 (four) hours as needed for wheezing or shortness of breath. 05/03/14  Yes John L Molpus, MD  cephALEXin (KEFLEX) 500 MG capsule Take 1 capsule (500 mg total) by mouth 2 (two) times daily. 03/28/14   Enid Skeens, MD  clindamycin (CLEOCIN) 150 MG capsule Take 3 capsules (450 mg total) by mouth 4 (four) times daily. 09/13/14   Audree Camel, MD  doxycycline (VIBRAMYCIN) 100 MG capsule Take 1 capsule (100 mg total) by mouth 2 (two) times daily. 07/22/14   Dione Booze, MD  HYDROcodone-acetaminophen (NORCO) 5-325 MG per tablet Take 1-2 tablets by mouth every 4 (four) hours as needed for severe pain. 09/13/14   Audree Camel, MD  HYDROcodone-acetaminophen (NORCO/VICODIN) 5-325 MG per tablet Take 2 tablets by mouth every 4 (four) hours as needed for moderate pain.    Historical Provider, MD  ibuprofen (ADVIL,MOTRIN) 800 MG tablet Take 800 mg by mouth every 6 (six) hours as needed.    Historical Provider, MD  metroNIDAZOLE (FLAGYL) 500 MG tablet Take 1 tablet (500 mg total) by mouth 2 (two) times daily. 07/22/14   Dione Booze, MD  nitrofurantoin, macrocrystal-monohydrate, (MACROBID) 100 MG capsule Take 1 capsule (100 mg total) by mouth 2 (two) times daily. X 7 days 11/16/13   Hanley Seamen, MD  oxyCODONE-acetaminophen (PERCOCET) 5-325 MG per tablet Take 1 tablet  by mouth every 4 (four) hours as needed for moderate pain. 07/22/14   Dione Booze, MD  traMADol (ULTRAM) 50 MG tablet Take 1 tablet (50 mg total) by mouth every 6 (six) hours as needed. 04/07/14   Teressa Lower, NP   BP 113/74 mmHg  Pulse 59  Temp(Src) 98.8 F (37.1 C) (Oral)  Resp 18  Ht  (1.727 m)  Wt 153 lb (69.4 kg)  BMI 23.27 kg/m2  SpO2 100%  LMP 10/04/2014 Physical Exam  Constitutional: She is oriented to person, place, and time.  She appears well-developed and well-nourished. No distress.  HENT:  Head: Normocephalic and atraumatic.  Mouth/Throat: Oropharynx is clear and moist.  Eyes: EOM are normal. Pupils are equal, round, and reactive to light.  Neck: Normal range of motion. Neck supple.  Cardiovascular: Normal rate and regular rhythm.  Exam reveals no friction rub.   No murmur heard. Pulmonary/Chest: Effort normal and breath sounds normal. No respiratory distress. She has no wheezes. She has no rales.  Abdominal: Soft. She exhibits no distension. There is no tenderness. There is no rebound.  Genitourinary: Cervix exhibits motion tenderness (mild) and discharge (thin, white, mild). Right adnexum displays tenderness (mild). Right adnexum displays no mass and no fullness. Left adnexum displays no mass, no tenderness and no fullness.  Musculoskeletal: Normal range of motion. She exhibits no edema.  Neurological: She is alert and oriented to person, place, and time. No cranial nerve deficit. She exhibits normal muscle tone. Coordination normal.  Skin: No rash noted. She is not diaphoretic.  Nursing note and vitals reviewed.   ED Course  Procedures (including critical care time) Labs Review Labs Reviewed - No data to display  Imaging Review US Ob Comp Less 14 Wks  11/12/2014   CLINICAL DATA:  First trimester pregnancy with vaginal bleeding today. Quadrant pain. LMP 10/04/2014. Initial encounter.  EXAM: OBSTETRIC <14 WK [place] AND TRANSVAGINAL OB US  TECHNIQUE: Both transabdominal and transvaginal ultrasound examinations were performed for complete evaluation of the gestation as well as the maternal uterus, adnexal regions, and pelvic cul-de-sac. Transvaginal technique was performed to assess early pregnancy.  COMPARISON:  None.  FINDINGS: Intrauterine gestational sac: There is a small cystic structure within the endometrial cavity, likely a small gestational sac.  Yolk sac:  Not visualized.  Embryo:  Not visualized.  MSD:   2.5  mm   5 w   0  d              [place] EDC: 07/15/2015  Maternal uterus/adnexae: No evidence of subchorionic hematoma or adnexal mass. The right ovary appears normal. There is a probable small corpus luteum on the left.  IMPRESSION: Probable early intrauterine gestational sac, but no yolk sac, fetal pole, or cardiac activity yet visualized. Recommend follow-up quantitative B-HCG levels and follow-up US in 14 days to confirm and assess viability. This recommendation follows SRU consensus guidelines: Diagnostic Criteria for Nonviable Pregnancy Early in the First Trimester. Malva Limes Med 2013; 161:0960-45.   Electronically Signed   By: Roxy Horseman M.D.   On: 11/12/2014 12:27   US Ob Transvaginal  11/12/2014   CLINICAL DATA:  First trimester pregnancy with vaginal bleeding today. Quadrant pain. LMP 10/04/2014. Initial encounter.  EXAM: OBSTETRIC <14 WK [place] AND TRANSVAGINAL OB US  TECHNIQUE: Both transabdominal and transvaginal ultrasound examinations were performed for complete evaluation of the gestation as well as the maternal uterus, adnexal regions, and pelvic cul-de-sac. Transvaginal technique was performed to assess early pregnancy.  COMPARISON:  None.  FINDINGS: Intrauterine gestational sac: There is a small cystic structure within the endometrial cavity, likely a small gestational sac.  Yolk sac:  Not visualized.  Embryo:  Not visualized.  MSD:  2.5  mm   5 w   0  d              US EDC: 07/15/2015  Maternal uterus/adnexae: No evidence of subchorionic hematoma or adnexal mass. The right ovary appears normal. There is a probable small corpus luteum on the left.  IMPRESSION: Probable early intrauterine gestational sac, but no yolk sac, fetal pole, or cardiac activity yet visualized. Recommend follow-up quantitative B-HCG levels and follow-up US in 14 days to confirm and assess viability. This recommendation follows SRU consensus guidelines: Diagnostic Criteria for Nonviable Pregnancy Early in the First Trimester. Malva Limes  Engl J Med 2013; 409:8119-14; 369:1443-51.   Electronically Signed   By: Roxy HorsemanBill  Veazey M.D.   On: 11/12/2014 12:27     EKG Interpretation None      MDM   Final diagnoses:  Threatened abortion  Pregnancy    24 year old female here with intermittent vaginal bleeding for the past 3 days. Similar to menstrual bleeding. No fevers, vomiting. Has been having some diarrhea. Had recent positive pregnancy test. One pregnancy before. Also having very rare right sided lower quadrant pain that lasts a second or 2. This happened 3 times. On exam belly is benign for me. Pelvic without bleeding, mild white discharge. Cervical and right adnexal pain. Was recently treated for PID so will need ultrasound.  US shows 5 week pregnancy. No fetus visualized yet. Counseled this could be threatened abortion. No signs of ectopic. Instructed to f/u with OB.  Elwin MochaBlair Annjeanette Sarwar, MD 11/12/14 1318

## 2014-11-13 LAB — GC/CHLAMYDIA PROBE AMP (~~LOC~~) NOT AT ARMC
CHLAMYDIA, DNA PROBE: NEGATIVE
Neisseria Gonorrhea: NEGATIVE

## 2014-11-26 ENCOUNTER — Inpatient Hospital Stay (HOSPITAL_COMMUNITY): Payer: Medicaid Other

## 2014-11-26 ENCOUNTER — Encounter (HOSPITAL_COMMUNITY): Payer: Self-pay | Admitting: *Deleted

## 2014-11-26 ENCOUNTER — Inpatient Hospital Stay (HOSPITAL_COMMUNITY)
Admission: AD | Admit: 2014-11-26 | Discharge: 2014-11-27 | Disposition: A | Discharge status: Home / Self Care | Payer: Medicaid Other | Source: Ambulatory Visit | Attending: Obstetrics and Gynecology | Admitting: Obstetrics and Gynecology

## 2014-11-26 DIAGNOSIS — Z3A01 Less than 8 weeks gestation of pregnancy: Secondary | ICD-10-CM | POA: Insufficient documentation

## 2014-11-26 DIAGNOSIS — O209 Hemorrhage in early pregnancy, unspecified: Secondary | ICD-10-CM | POA: Diagnosis not present

## 2014-11-26 DIAGNOSIS — R109 Unspecified abdominal pain: Secondary | ICD-10-CM | POA: Insufficient documentation

## 2014-11-26 DIAGNOSIS — R21 Rash and other nonspecific skin eruption: Secondary | ICD-10-CM | POA: Insufficient documentation

## 2014-11-26 DIAGNOSIS — Z87891 Personal history of nicotine dependence: Secondary | ICD-10-CM | POA: Diagnosis not present

## 2014-11-26 LAB — CBC WITH DIFFERENTIAL/PLATELET
Basophils Absolute: 0 10*3/uL (ref 0.0–0.1)
Basophils Relative: 1 % (ref 0–1)
EOS ABS: 0.4 10*3/uL (ref 0.0–0.7)
EOS PCT: 5 % (ref 0–5)
HCT: 36.1 % (ref 36.0–46.0)
Hemoglobin: 12.3 g/dL (ref 12.0–15.0)
LYMPHS PCT: 37 % (ref 12–46)
Lymphs Abs: 3.2 10*3/uL (ref 0.7–4.0)
MCH: 31.5 pg (ref 26.0–34.0)
MCHC: 34.1 g/dL (ref 30.0–36.0)
MCV: 92.6 fL (ref 78.0–100.0)
Monocytes Absolute: 0.6 10*3/uL (ref 0.1–1.0)
Monocytes Relative: 7 % (ref 3–12)
Neutro Abs: 4.3 10*3/uL (ref 1.7–7.7)
Neutrophils Relative %: 50 % (ref 43–77)
PLATELETS: 205 10*3/uL (ref 150–400)
RBC: 3.9 MIL/uL (ref 3.87–5.11)
RDW: 12.4 % (ref 11.5–15.5)
WBC: 8.5 10*3/uL (ref 4.0–10.5)

## 2014-11-26 LAB — URINALYSIS, ROUTINE W REFLEX MICROSCOPIC
BILIRUBIN URINE: NEGATIVE
Glucose, UA: NEGATIVE mg/dL
HGB URINE DIPSTICK: NEGATIVE
Ketones, ur: NEGATIVE mg/dL
Leukocytes, UA: NEGATIVE
Nitrite: NEGATIVE
Protein, ur: NEGATIVE mg/dL
Specific Gravity, Urine: 1.015 (ref 1.005–1.030)
Urobilinogen, UA: 0.2 mg/dL (ref 0.0–1.0)
pH: 6 (ref 5.0–8.0)

## 2014-11-26 NOTE — MAU Provider Note (Signed)
History     CSN: [id number]  Arrival date and time: 11/26/14 2222   First Provider Initiated Contact with Patient 11/26/14 2302      No chief complaint on file.  HPI  [name] is a 24 y.o. G2P0 at [redacted]w[redacted]d who presents to MAU today with complaint of vaginal bleeding and lower abdominal pain. The patient was seen at [facility] with similar symptoms on 11/12/14. [place] that day showed possible IUGS without YS or FP at [redacted]w[redacted]d and quant hCG was 1004. She was instructed to follow-up for 48 hour labs and did not since bleeding had stopped. The patient states bleeding restarted ~ 1 hour prior to arrival in MAU. She wiped and noted a large amount of mucous discharge with blood streaks. She states continued pain since first evaluation of the pregnancy that has become sharp in nature. She has had nausea without vomiting. She denies UTI symptoms or fever. She is also concerned about a "rash" on her neck that was first noted prior to arrival. She denies SOB or edema of the lips or tongue.   OB History    Gravida Para Term Preterm AB TAB SAB Ectopic Multiple Living   2               Past Medical History  Diagnosis Date  . Acute URI   . Asthma     Past Surgical History  Procedure Laterality Date  . Wisdom tooth extraction      History reviewed. No pertinent family history.  History  Substance Use Topics  . Smoking status: Former Smoker -- 1.00 packs/day    Quit date: 11/09/2014  . Smokeless tobacco: Never Used  . Alcohol Use: Yes     Comment: 1 beer/month    Allergies: No Known Allergies  No prescriptions prior to admission    Review of Systems  Constitutional: Negative for fever and malaise/fatigue.  Gastrointestinal: Positive for nausea and abdominal pain. Negative for vomiting, diarrhea and constipation.  Genitourinary: Negative for dysuria, urgency and frequency.       + vaginal bleeding   Physical Exam   Blood pressure 114/73, pulse 78, temperature 98.1 F (36.7 C), temperature  source Oral, resp. rate 16, height  (1.727 m), weight 159 lb (72.122 kg), last menstrual period 10/04/2014, SpO2 100 %.  Physical Exam  Constitutional: She is oriented to person, place, and time. She appears well-developed and well-nourished. No distress.  HENT:  Head: Normocephalic.  Cardiovascular: Normal rate.   Respiratory: Effort normal.  GI: Soft. She exhibits no distension and no mass. There is no tenderness. There is no rebound and no guarding.  Genitourinary: Uterus is not enlarged and not tender. Cervix exhibits no motion tenderness, no discharge and no friability. Right adnexum displays no mass and no tenderness. Left adnexum displays no mass and no tenderness. There is bleeding (scant pink) in the vagina. Vaginal discharge (small amount of thin, white discharge noted) found.  Neurological: She is alert and oriented to person, place, and time.  Skin: Skin is warm and dry. No erythema.  Psychiatric: She has a normal mood and affect.   Results for orders placed or performed during the hospital encounter of 11/26/14 (from the past 24 hour(s))  Urinalysis, Routine w reflex microscopic     Status: None   Collection Time: 11/26/14 10:44 PM  Result Value Ref Range   Color, Urine YELLOW YELLOW   APPearance CLEAR CLEAR   Specific Gravity, Urine 1.015 1.005 - 1.030  pH 6.0 5.0 - 8.0   Glucose, UA NEGATIVE NEGATIVE mg/dL   Hgb urine dipstick NEGATIVE NEGATIVE   Bilirubin Urine NEGATIVE NEGATIVE   Ketones, ur NEGATIVE NEGATIVE mg/dL   Protein, ur NEGATIVE NEGATIVE mg/dL   Urobilinogen, UA 0.2 0.0 - 1.0 mg/dL   Nitrite NEGATIVE NEGATIVE   Leukocytes, UA NEGATIVE NEGATIVE  ABO/Rh     Status: None   Collection Time: 11/26/14 11:06 PM  Result Value Ref Range   ABO/RH(D) A POS   CBC with Differential/Platelet     Status: None   Collection Time: 11/26/14 11:06 PM  Result Value Ref Range   WBC 8.5 4.0 - 10.5 K/uL   RBC 3.90 3.87 - 5.11 MIL/uL   Hemoglobin 12.3 12.0 - 15.0 g/dL    HCT 16.136.1 09.636.0 - 04.546.0 %   MCV 92.6 78.0 - 100.0 fL   MCH 31.5 26.0 - 34.0 pg   MCHC 34.1 30.0 - 36.0 g/dL   RDW 40.912.4 81.111.5 - 91.415.5 %   Platelets 205 150 - 400 K/uL   Neutrophils Relative % 50 43 - 77 %   Neutro Abs 4.3 1.7 - 7.7 K/uL   Lymphocytes Relative 37 12 - 46 %   Lymphs Abs 3.2 0.7 - 4.0 K/uL   Monocytes Relative 7 3 - 12 %   Monocytes Absolute 0.6 0.1 - 1.0 K/uL   Eosinophils Relative 5 0 - 5 %   Eosinophils Absolute 0.4 0.0 - 0.7 K/uL   Basophils Relative 1 0 - 1 %   Basophils Absolute 0.0 0.0 - 0.1 K/uL  hCG, quantitative, pregnancy     Status: Abnormal   Collection Time: 11/26/14 11:06 PM  Result Value Ref Range   hCG, Beta Chain, Quant, S 6170 (H) <5 mIU/mL   [place] Ob Transvaginal  11/27/2014   CLINICAL DATA:  Pregnant patient with vaginal bleeding since 11/10/2014.  EXAM: TRANSVAGINAL OB ULTRASOUND  TECHNIQUE: Transvaginal ultrasound was performed for complete evaluation of the gestation as well as the maternal uterus, adnexal regions, and pelvic cul-de-sac.  COMPARISON:  OB ultrasound 11/12/2014.  FINDINGS: Intrauterine gestational sac: Visualized/normal in shape.  Yolk sac:  Visualized.  Embryo:  Not visualized.  Cardiac Activity: Not applicable.  MSD: 10.5  mm   5 w   5  d     US EDC: 07/24/2015.  Maternal uterus/adnexae: Unremarkable.  IMPRESSION: Intrauterine gestational sac with a yolk sac visualized but no fetal pole this time likely due to early gestational age. Negative for ectopic pregnancy or other acute abnormality.   Electronically Signed   By: Drusilla Kannerhomas  Dalessio M.D.   On: 11/27/2014 00:13     MAU Course  Procedures None  MDM CBC, ABO/Rh, quant hCG and US today Discussed US with Dr. Orson Slickallesio in radiology. He was not overly concerned about dating discrepancy from previous US since last US IUGS was not well defined for measurement.  Discussed results with patient and concern that pregnancy may not by normal. Will schedule for follow-up US in 1 week to confirm  viability vs missed AB.  Assessment and Plan  A: IUGs and YS at [redacted]w[redacted]d Vaginal bleeding in pregnancy Abdominal pain in pregnancy  P: Discharge home Bleeding precautions discussed Pelvic rest advised Patient will follow-up for confrimatory US in 1 week. Radiology will call with appointment time Patient may return to MAU as needed or if her condition were to change or worsen   Marny LowensteinJulie N Wenzel, PA-C  11/27/2014, 1:28 AM

## 2014-11-26 NOTE — MAU Note (Signed)
Pt reports she has had cramping since she had positive pregnancy test, worsening today. Lower back pain. Today noticed a bloody discharge.

## 2014-11-26 NOTE — MAU Note (Signed)
Pt states she started having vaginal bleeding about 2 hours ago. Pt states she is also having sharp pains in  Her back as well

## 2014-11-26 NOTE — Progress Notes (Signed)
Pt states she had a bad headache around about 1600 today

## 2014-11-27 ENCOUNTER — Other Ambulatory Visit (HOSPITAL_COMMUNITY): Payer: Medicaid Other

## 2014-11-27 DIAGNOSIS — Z3A01 Less than 8 weeks gestation of pregnancy: Secondary | ICD-10-CM

## 2014-11-27 DIAGNOSIS — O209 Hemorrhage in early pregnancy, unspecified: Secondary | ICD-10-CM

## 2014-11-27 LAB — ABO/RH: ABO/RH(D): A POS

## 2014-11-27 LAB — HCG, QUANTITATIVE, PREGNANCY: HCG, BETA CHAIN, QUANT, S: 6170 m[IU]/mL — AB (ref <5)

## 2014-11-27 NOTE — Discharge Instructions (Signed)
Pelvic Rest °Pelvic rest is sometimes recommended for women when:  °· The placenta is partially or completely covering the opening of the cervix (placenta previa). °· There is bleeding between the uterine wall and the amniotic sac in the first trimester (subchorionic hemorrhage). °· The cervix begins to open without labor starting (incompetent cervix, cervical insufficiency). °· The labor is too early (preterm labor). °HOME CARE INSTRUCTIONS °· Do not have sexual intercourse, stimulation, or an orgasm. °· Do not use tampons, douche, or put anything in the vagina. °· Do not lift anything over 10 pounds (4.5 kg). °· Avoid strenuous activity or straining your pelvic muscles. °SEEK MEDICAL CARE IF:  °· You have any vaginal bleeding during pregnancy. Treat this as a potential emergency. °· You have cramping pain felt low in the stomach (stronger than menstrual cramps). °· You notice vaginal discharge (watery, mucus, or bloody). °· You have a low, dull backache. °· There are regular contractions or uterine tightening. °SEEK IMMEDIATE MEDICAL CARE IF: °You have vaginal bleeding and have placenta previa.  °Document Released: 01/21/2011 Document Revised: 12/19/2011 Document Reviewed: 01/21/2011 °ExitCare® Patient Information ©2015 [employer]. This information is not intended to replace advice given to you by your health care provider. Make sure you discuss any questions you have with your health care provider. ° °Vaginal Bleeding During Pregnancy, First Trimester °A small amount of bleeding (spotting) from the vagina is common in early pregnancy. Sometimes the bleeding is normal and is not a problem, and sometimes it is a sign of something serious. Be sure to tell your doctor about any bleeding from your vagina right away. °HOME CARE °· Watch your condition for any changes. °· Follow your doctor's instructions about how active you can be. °· If you are on bed rest: °¨ You may need to stay in bed and only get up to use the  bathroom. °¨ You may be allowed to do some activities. °¨ If you need help, make plans for someone to help you. °· Write down: °¨ The number of pads you use each day. °¨ How often you change pads. °¨ How soaked (saturated) your pads are. °· Do not use tampons. °· Do not douche. °· Do not have sex or orgasms until your doctor says it is okay. °· If you pass any tissue from your vagina, save the tissue so you can show it to your doctor. °· Only take medicines as told by your doctor. °· Do not take aspirin because it can make you bleed. °· Keep all follow-up visits as told by your doctor. °GET HELP IF:  °· You bleed from your vagina. °· You have cramps. °· You have labor pains. °· You have a fever that does not go away after you take medicine. °GET HELP RIGHT AWAY IF:  °· You have very bad cramps in your back or belly (abdomen). °· You pass large clots or tissue from your vagina. °· You bleed more. °· You feel light-headed or weak. °· You pass out (faint). °· You have chills. °· You are leaking fluid or have a gush of fluid from your vagina. °· You pass out while pooping (having a bowel movement). °MAKE SURE YOU: °· Understand these instructions. °· Will watch your condition. °· Will get help right away if you are not doing well or get worse. °Document Released: 02/10/2014 Document Reviewed: 06/03/2013 °ExitCare® Patient Information ©2015 [employer]. This information is not intended to replace advice given to you by your health care provider. Make   sure you discuss any questions you have with your health care provider. ° °

## 2014-12-01 ENCOUNTER — Inpatient Hospital Stay (HOSPITAL_COMMUNITY)
Admission: AD | Admit: 2014-12-01 | Discharge: 2014-12-02 | Disposition: A | Discharge status: Home / Self Care | Payer: Medicaid Other | Source: Ambulatory Visit | Attending: Obstetrics & Gynecology | Admitting: Obstetrics & Gynecology

## 2014-12-01 DIAGNOSIS — O9989 Other specified diseases and conditions complicating pregnancy, childbirth and the puerperium: Secondary | ICD-10-CM | POA: Insufficient documentation

## 2014-12-01 DIAGNOSIS — Z87891 Personal history of nicotine dependence: Secondary | ICD-10-CM | POA: Insufficient documentation

## 2014-12-01 DIAGNOSIS — R8290 Unspecified abnormal findings in urine: Secondary | ICD-10-CM

## 2014-12-01 DIAGNOSIS — Z3A01 Less than 8 weeks gestation of pregnancy: Secondary | ICD-10-CM | POA: Insufficient documentation

## 2014-12-02 ENCOUNTER — Encounter (HOSPITAL_COMMUNITY): Payer: Self-pay

## 2014-12-02 DIAGNOSIS — R109 Unspecified abdominal pain: Secondary | ICD-10-CM | POA: Diagnosis present

## 2014-12-02 DIAGNOSIS — R8290 Unspecified abnormal findings in urine: Secondary | ICD-10-CM

## 2014-12-02 DIAGNOSIS — O9989 Other specified diseases and conditions complicating pregnancy, childbirth and the puerperium: Secondary | ICD-10-CM | POA: Diagnosis not present

## 2014-12-02 DIAGNOSIS — Z3A01 Less than 8 weeks gestation of pregnancy: Secondary | ICD-10-CM | POA: Diagnosis not present

## 2014-12-02 DIAGNOSIS — Z87891 Personal history of nicotine dependence: Secondary | ICD-10-CM | POA: Diagnosis not present

## 2014-12-02 DIAGNOSIS — Z3A08 8 weeks gestation of pregnancy: Secondary | ICD-10-CM

## 2014-12-02 LAB — URINALYSIS, ROUTINE W REFLEX MICROSCOPIC
BILIRUBIN URINE: NEGATIVE
Glucose, UA: NEGATIVE mg/dL
HGB URINE DIPSTICK: NEGATIVE
Ketones, ur: NEGATIVE mg/dL
Leukocytes, UA: NEGATIVE
Nitrite: NEGATIVE
PROTEIN: NEGATIVE mg/dL
SPECIFIC GRAVITY, URINE: 1.015 (ref 1.005–1.030)
Urobilinogen, UA: 0.2 mg/dL (ref 0.0–1.0)
pH: 7.5 (ref 5.0–8.0)

## 2014-12-02 LAB — WET PREP, GENITAL
CLUE CELLS WET PREP: NONE SEEN
Trich, Wet Prep: NONE SEEN
Yeast Wet Prep HPF POC: NONE SEEN

## 2014-12-02 NOTE — Discharge Instructions (Signed)
First Trimester of Pregnancy The first trimester of pregnancy is from week 1 until the end of week 12 (months 1 through 3). A week after a sperm fertilizes an egg, the egg will implant on the wall of the uterus. This embryo will begin to develop into a baby. Genes from you and your partner are forming the baby. The female genes determine whether the baby is a boy or a girl. At 6-8 weeks, the eyes and face are formed, and the heartbeat can be seen on ultrasound. At the end of 12 weeks, all the baby's organs are formed.  Now that you are pregnant, you will want to do everything you can to have a healthy baby. Two of the most important things are to get good prenatal care and to follow your health care provider's instructions. Prenatal care is all the medical care you receive before the baby's birth. This care will help prevent, find, and treat any problems during the pregnancy and childbirth. BODY CHANGES Your body goes through many changes during pregnancy. The changes vary from woman to woman.   You may gain or lose a couple of pounds at first.  You may feel sick to your stomach (nauseous) and throw up (vomit). If the vomiting is uncontrollable, call your health care provider.  You may tire easily.  You may develop headaches that can be relieved by medicines approved by your health care provider.  You may urinate more often. Painful urination may mean you have a bladder infection.  You may develop heartburn as a result of your pregnancy.  You may develop constipation because certain hormones are causing the muscles that push waste through your intestines to slow down.  You may develop hemorrhoids or swollen, bulging veins (varicose veins).  Your breasts may begin to grow larger and become tender. Your nipples may stick out more, and the tissue that surrounds them (areola) may become darker.  Your gums may bleed and may be sensitive to brushing and flossing.  Dark spots or blotches (chloasma,  mask of pregnancy) may develop on your face. This will likely fade after the baby is born.  Your menstrual periods will stop.  You may have a loss of appetite.  You may develop cravings for certain kinds of food.  You may have changes in your emotions from day to day, such as being excited to be pregnant or being concerned that something may go wrong with the pregnancy and baby.  You may have more vivid and strange dreams.  You may have changes in your hair. These can include thickening of your hair, rapid growth, and changes in texture. Some women also have hair loss during or after pregnancy, or hair that feels dry or thin. Your hair will most likely return to normal after your baby is born. WHAT TO EXPECT AT YOUR PRENATAL VISITS During a routine prenatal visit:  You will be weighed to make sure you and the baby are growing normally.  Your blood pressure will be taken.  Your abdomen will be measured to track your baby's growth.  The fetal heartbeat will be listened to starting around week 10 or 12 of your pregnancy.  Test results from any previous visits will be discussed. Your health care provider may ask you:  How you are feeling.  If you are feeling the baby move.  If you have had any abnormal symptoms, such as leaking fluid, bleeding, severe headaches, or abdominal cramping.  If you have any questions. Other tests   that may be performed during your first trimester include:  Blood tests to find your blood type and to check for the presence of any previous infections. They will also be used to check for low iron levels (anemia) and Rh antibodies. Later in the pregnancy, blood tests for diabetes will be done along with other tests if problems develop.  Urine tests to check for infections, diabetes, or protein in the urine.  An ultrasound to confirm the proper growth and development of the baby.  An amniocentesis to check for possible genetic problems.  Fetal screens for  spina bifida and Down syndrome.  You may need other tests to make sure you and the baby are doing well. HOME CARE INSTRUCTIONS  Medicines  Follow your health care provider's instructions regarding medicine use. Specific medicines may be either safe or unsafe to take during pregnancy.  Take your prenatal vitamins as directed.  If you develop constipation, try taking a stool softener if your health care provider approves. Diet  Eat regular, well-balanced meals. Choose a variety of foods, such as meat or vegetable-based protein, fish, milk and low-fat dairy products, vegetables, fruits, and whole grain breads and cereals. Your health care provider will help you determine the amount of weight gain that is right for you.  Avoid raw meat and uncooked cheese. These carry germs that can cause birth defects in the baby.  Eating four or five small meals rather than three large meals a day may help relieve nausea and vomiting. If you start to feel nauseous, eating a few soda crackers can be helpful. Drinking liquids between meals instead of during meals also seems to help nausea and vomiting.  If you develop constipation, eat more high-fiber foods, such as fresh vegetables or fruit and whole grains. Drink enough fluids to keep your urine clear or pale yellow. Activity and Exercise  Exercise only as directed by your health care provider. Exercising will help you:  Control your weight.  Stay in shape.  Be prepared for labor and delivery.  Experiencing pain or cramping in the lower abdomen or low back is a good sign that you should stop exercising. Check with your health care provider before continuing normal exercises.  Try to avoid standing for long periods of time. Move your legs often if you must stand in one place for a long time.  Avoid heavy lifting.  Wear low-heeled shoes, and practice good posture.  You may continue to have sex unless your health care provider directs you  otherwise. Relief of Pain or Discomfort  Wear a good support bra for breast tenderness.   Take warm sitz baths to soothe any pain or discomfort caused by hemorrhoids. Use hemorrhoid cream if your health care provider approves.   Rest with your legs elevated if you have leg cramps or low back pain.  If you develop varicose veins in your legs, wear support hose. Elevate your feet for 15 minutes, 3-4 times a day. Limit salt in your diet. Prenatal Care  Schedule your prenatal visits by the twelfth week of pregnancy. They are usually scheduled monthly at first, then more often in the last 2 months before delivery.  Write down your questions. Take them to your prenatal visits.  Keep all your prenatal visits as directed by your health care provider. Safety  Wear your seat belt at all times when driving.  Make a list of emergency phone numbers, including numbers for family, friends, the hospital, and police and fire departments. General Tips    Ask your health care provider for a referral to a local prenatal education class. Begin classes no later than at the beginning of month 6 of your pregnancy.  Ask for help if you have counseling or nutritional needs during pregnancy. Your health care provider can offer advice or refer you to specialists for help with various needs.  Do not use hot tubs, steam rooms, or saunas.  Do not douche or use tampons or scented sanitary pads.  Do not cross your legs for long periods of time.  Avoid cat litter boxes and soil used by cats. These carry germs that can cause birth defects in the baby and possibly loss of the fetus by miscarriage or stillbirth.  Avoid all smoking, herbs, alcohol, and medicines not prescribed by your health care provider. Chemicals in these affect the formation and growth of the baby.  Schedule a dentist appointment. At home, brush your teeth with a soft toothbrush and be gentle when you floss. SEEK MEDICAL CARE IF:   You have  dizziness.  You have mild pelvic cramps, pelvic pressure, or nagging pain in the abdominal area.  You have persistent nausea, vomiting, or diarrhea.  You have a bad smelling vaginal discharge.  You have pain with urination.  You notice increased swelling in your face, hands, legs, or ankles. SEEK IMMEDIATE MEDICAL CARE IF:   You have a fever.  You are leaking fluid from your vagina.  You have spotting or bleeding from your vagina.  You have severe abdominal cramping or pain.  You have rapid weight gain or loss.  You vomit blood or material that looks like coffee grounds.  You are exposed to German measles and have never had them.  You are exposed to fifth disease or chickenpox.  You develop a severe headache.  You have shortness of breath.  You have any kind of trauma, such as from a fall or a car accident. Document Released: 09/20/2001 Document Revised: 02/10/2014 Document Reviewed: 08/06/2013 ExitCare Patient Information 2015 [employer]. This information is not intended to replace advice given to you by your health care provider. Make sure you discuss any questions you have with your health care provider.  

## 2014-12-02 NOTE — MAU Note (Signed)
Patient walked out before able to get discharge vital signs and give discharge instructions.

## 2014-12-02 NOTE — MAU Provider Note (Signed)
History     CSN: [id number]  Arrival date and time: 12/01/14 2355   First Provider Initiated Contact with Patient 12/02/14 0054      Chief Complaint  Patient presents with  . Abdominal Pain  . Back Pain   HPI  Amy Little is a 24 y.o. G2P1001 at [redacted]w[redacted]d who presents today because her "urine smells really bad". She states that she has not had "much" water today. She states that she has had coffee, juice and milk to drink. She has also been taking her PNV. She had an [place] that showed IUP on 11/26/14, and has a FU [place] on 12/04/14. She is planning on going to [facility] for prenatal care. She denies any VB. She denies any discharge, other than "normal" discharge.   Past Medical History  Diagnosis Date  . Acute URI   . Asthma     Past Surgical History  Procedure Laterality Date  . Wisdom tooth extraction      History reviewed. No pertinent family history.  History  Substance Use Topics  . Smoking status: Former Smoker -- 1.00 packs/day    Quit date: 11/09/2014  . Smokeless tobacco: Never Used  . Alcohol Use: Yes     Comment: 1 beer/month    Allergies: No Known Allergies  Prescriptions prior to admission  Medication Sig Dispense Refill Last Dose  . acetaminophen (TYLENOL) 500 MG tablet Take 500 mg by mouth every 6 (six) hours as needed.   12/01/2014 at Unknown time  . albuterol (PROVENTIL HFA;VENTOLIN HFA) 108 (90 BASE) MCG/ACT inhaler Inhale 2 puffs into the lungs every 4 (four) hours as needed for wheezing or shortness of breath. 1 Inhaler 0 Past Week at Unknown time  . Prenatal Vit-Fe Fumarate-FA (PRENATAL MULTIVITAMIN) TABS tablet Take 1 tablet by mouth daily at 12 noon.   12/01/2014 at Unknown time    ROS Physical Exam   Blood pressure 115/76, pulse 87, temperature 98.9 F (37.2 C), temperature source Oral, resp. rate 16, last menstrual period 10/04/2014.  Physical Exam  Nursing note and vitals reviewed. Constitutional: She is oriented to person, place, and time. She  appears well-developed and well-nourished. No distress.  Cardiovascular: Normal rate.   Respiratory: Effort normal.  GI: Soft. There is no tenderness.  Neurological: She is alert and oriented to person, place, and time.  Skin: Skin is warm and dry.  Psychiatric: She has a normal mood and affect.    MAU Course  Procedures  Results for orders placed or performed during the hospital encounter of 12/01/14 (from the past 24 hour(s))  Urinalysis, Routine w reflex microscopic     Status: Abnormal   Collection Time: 12/02/14 12:06 AM  Result Value Ref Range   Color, Urine YELLOW YELLOW   APPearance CLOUDY (A) CLEAR   Specific Gravity, Urine 1.015 1.005 - 1.030   pH 7.5 5.0 - 8.0   Glucose, UA NEGATIVE NEGATIVE mg/dL   Hgb urine dipstick NEGATIVE NEGATIVE   Bilirubin Urine NEGATIVE NEGATIVE   Ketones, ur NEGATIVE NEGATIVE mg/dL   Protein, ur NEGATIVE NEGATIVE mg/dL   Urobilinogen, UA 0.2 0.0 - 1.0 mg/dL   Nitrite NEGATIVE NEGATIVE   Leukocytes, UA NEGATIVE NEGATIVE  Wet prep, genital     Status: Abnormal   Collection Time: 12/02/14 12:30 AM  Result Value Ref Range   Yeast Wet Prep HPF POC NONE SEEN NONE SEEN   Trich, Wet Prep NONE SEEN NONE SEEN   Clue Cells Wet Prep HPF POC NONE SEEN NONE  SEEN   WBC, Wet Prep HPF POC FEW (A) NONE SEEN     Assessment and Plan   1. Bad odor of urine    DC home Increase water intake First trimester precautions reviewed  Return to MAU as needed   Follow-up Information    [facility].   Specialty:  Radiology   Why:  As scheduled   Contact information:   [address] [id number] [place] [id number] [phone]      Please follow up.   Contact information:   [facility]        Tawnya CrookHogan, Ivyonna Donovan 12/02/2014, 12:55 AM

## 2014-12-02 NOTE — MAU Note (Signed)
Lower abdominal & back pain. Urine smells like "road kill"; started today. Denies dysuria. Yellow vaginal discharge 2 days ago x 1. Denies vaginal irritation. Denies vaginal bleeding. Denies fever.

## 2014-12-03 ENCOUNTER — Inpatient Hospital Stay (HOSPITAL_COMMUNITY): Payer: Medicaid Other

## 2014-12-03 ENCOUNTER — Inpatient Hospital Stay (HOSPITAL_COMMUNITY)
Admission: AD | Admit: 2014-12-03 | Discharge: 2014-12-03 | Disposition: A | Discharge status: Home / Self Care | Payer: Medicaid Other | Source: Ambulatory Visit | Attending: Family Medicine | Admitting: Family Medicine

## 2014-12-03 ENCOUNTER — Encounter (HOSPITAL_COMMUNITY): Payer: Self-pay | Admitting: *Deleted

## 2014-12-03 DIAGNOSIS — O209 Hemorrhage in early pregnancy, unspecified: Secondary | ICD-10-CM | POA: Insufficient documentation

## 2014-12-03 DIAGNOSIS — O99511 Diseases of the respiratory system complicating pregnancy, first trimester: Secondary | ICD-10-CM | POA: Diagnosis not present

## 2014-12-03 DIAGNOSIS — Z3A01 Less than 8 weeks gestation of pregnancy: Secondary | ICD-10-CM

## 2014-12-03 DIAGNOSIS — O358XX0 Maternal care for other (suspected) fetal abnormality and damage, not applicable or unspecified: Secondary | ICD-10-CM | POA: Diagnosis not present

## 2014-12-03 LAB — URINE CULTURE
Colony Count: NO GROWTH
Culture: NO GROWTH

## 2014-12-03 LAB — CBC WITH DIFFERENTIAL/PLATELET
BASOS ABS: 0 10*3/uL (ref 0.0–0.1)
BASOS PCT: 0 % (ref 0–1)
EOS PCT: 4 % (ref 0–5)
Eosinophils Absolute: 0.2 10*3/uL (ref 0.0–0.7)
HEMATOCRIT: 33 % — AB (ref 36.0–46.0)
Hemoglobin: 11.7 g/dL — ABNORMAL LOW (ref 12.0–15.0)
Lymphocytes Relative: 15 % (ref 12–46)
Lymphs Abs: 0.7 10*3/uL (ref 0.7–4.0)
MCH: 32.2 pg (ref 26.0–34.0)
MCHC: 35.5 g/dL (ref 30.0–36.0)
MCV: 90.9 fL (ref 78.0–100.0)
MONO ABS: 0.6 10*3/uL (ref 0.1–1.0)
Monocytes Relative: 12 % (ref 3–12)
NEUTROS ABS: 3.1 10*3/uL (ref 1.7–7.7)
Neutrophils Relative %: 69 % (ref 43–77)
Platelets: 155 10*3/uL (ref 150–400)
RBC: 3.63 MIL/uL — ABNORMAL LOW (ref 3.87–5.11)
RDW: 12.2 % (ref 11.5–15.5)
WBC: 4.6 10*3/uL (ref 4.0–10.5)

## 2014-12-03 NOTE — MAU Provider Note (Signed)
History     CSN: [id number]  Arrival date and time: 12/03/14 [id number]   First Provider Initiated Contact with Patient 12/03/14 0430      No chief complaint on file.  HPI Ms. Elisabeth MostHeather Varano is a 24 y.o. G2P1001 at 6287w4d who presents to MAU today with complaint of vaginal bleeding. Patient was seen with vaginal bleeding last week as well. US showed a 1733w5d IUGS and YS, however there was possible growth discrepancy [facility]. Patient states bleeding restarted today at 0300. She has had associated LLQ and low back pain. She denies pain now. She denies any clots or POC. She states temperature of 100.3 F a few hours ago and took Tylenol.   OB History    Gravida Para Term Preterm AB TAB SAB Ectopic Multiple Living   2 1 1       1       Past Medical History  Diagnosis Date  . Acute URI   . Asthma     Past Surgical History  Procedure Laterality Date  . Wisdom tooth extraction      History reviewed. No pertinent family history.  History  Substance Use Topics  . Smoking status: Former Smoker -- 1.00 packs/day    Quit date: 11/09/2014  . Smokeless tobacco: Never Used  . Alcohol Use: Yes     Comment: 1 beer/month    Allergies: No Known Allergies  Prescriptions prior to admission  Medication Sig Dispense Refill Last Dose  . acetaminophen (TYLENOL) 500 MG tablet Take 500 mg by mouth every 6 (six) hours as needed.   12/03/2014 at 0215  . albuterol (PROVENTIL HFA;VENTOLIN HFA) 108 (90 BASE) MCG/ACT inhaler Inhale 2 puffs into the lungs every 4 (four) hours as needed for wheezing or shortness of breath. 1 Inhaler 0 12/02/2014 at Unknown time  . Prenatal Vit-Fe Fumarate-FA (PRENATAL MULTIVITAMIN) TABS tablet Take 1 tablet by mouth daily at 12 noon.   12/02/2014 at Unknown time    Review of Systems  Constitutional: Positive for fever. Negative for malaise/fatigue.  Gastrointestinal: Positive for abdominal pain.  Genitourinary:       + vaginal bleeding   Physical Exam    Blood pressure 117/70, pulse 94, temperature 98.7 F (37.1 C), temperature source Oral, resp. rate 18, height 5\' 8"  (1.727 m), weight 160 lb (72.576 kg), last menstrual period 10/04/2014, SpO2 98 %.  Physical Exam  Constitutional: She is oriented to person, place, and time. She appears well-developed and well-nourished. No distress.  HENT:  Head: Normocephalic.  Cardiovascular: Normal rate.   Respiratory: Effort normal.  GI: Soft. She exhibits no distension and no mass. There is tenderness ( mild suprapubic tenderness to palpation). There is no rebound and no guarding.  Genitourinary: Uterus is enlarged (slightly). Uterus is not tender. Cervix exhibits no motion tenderness, no discharge and no friability. Right adnexum displays no mass and no tenderness. Left adnexum displays no mass and no tenderness. There is bleeding (small amount of dark red blood in the vaginal vault) in the vagina. No vaginal discharge found.  Neurological: She is alert and oriented to person, place, and time.  Skin: Skin is warm and dry. No erythema.  Psychiatric: She has a normal mood and affect.   Results for orders placed or performed during the hospital encounter of 12/03/14 (from the past 24 hour(s))  CBC with Differential/Platelet     Status: Abnormal   Collection Time: 12/03/14  4:42 AM  Result Value Ref Range  WBC 4.6 4.0 - 10.5 K/uL   RBC 3.63 (L) 3.87 - 5.11 MIL/uL   Hemoglobin 11.7 (L) 12.0 - 15.0 g/dL   HCT 40.9 (L) 81.1 - 91.4 %   MCV 90.9 78.0 - 100.0 fL   MCH 32.2 26.0 - 34.0 pg   MCHC 35.5 30.0 - 36.0 g/dL   RDW 78.2 95.6 - 21.3 %   Platelets 155 150 - 400 K/uL   Neutrophils Relative % 69 43 - 77 %   Neutro Abs 3.1 1.7 - 7.7 K/uL   Lymphocytes Relative 15 12 - 46 %   Lymphs Abs 0.7 0.7 - 4.0 K/uL   Monocytes Relative 12 3 - 12 %   Monocytes Absolute 0.6 0.1 - 1.0 K/uL   Eosinophils Relative 4 0 - 5 %   Eosinophils Absolute 0.2 0.0 - 0.7 K/uL   Basophils Relative 0 0 - 1 %   Basophils  Absolute 0.0 0.0 - 0.1 K/uL   US Ob Transvaginal  12/03/2014   CLINICAL DATA:  Pregnant patient with vaginal bleeding and left lower abdominal pain.  EXAM: TRANSVAGINAL OB ULTRASOUND  TECHNIQUE: Transvaginal ultrasound was performed for complete evaluation of the gestation as well as the maternal uterus, adnexal regions, and pelvic cul-de-sac.  COMPARISON:  Ultrasound 11/26/2014  FINDINGS: Intrauterine gestational sac: Visualized/normal in shape.  Yolk sac:  Present.  Prominent in size measuring 8.5 mm.  Embryo:  Present.  Cardiac Activity: Present.  Heart Rate: 51 bpm  MSD: 12.4   mm   6 w   0  d  CRL:   4.8   mm   6 w 2 d                  [place] EDC: 07/11/2015  Maternal uterus/adnexae: Small subchorionic hemorrhage. Right ovary measures 3.2 x 1.5 x 2.9 cm with normal blood flow. Left ovary measures 4.0 x 2.7 x 2.1 cm with normal blood flow. Trace pelvic free fluid.  IMPRESSION: Since the prior exam there has been development of a fetal pole with fetal heart tones within the intrauterine gestational sac. However fetal heart rate is low at 51 beats per minute and yolk sac appears prominent. There is a small amount of subchorionic hemorrhage. Estimated gestational age of [redacted] weeks 2 days for estimated date of delivery 07/11/2015. Close sonographic follow-up is recommended.   Electronically Signed   By: Rubye Oaks M.D.   On: 12/03/2014 05:55     MAU Course  Procedures None  MDM CBC and Korea today A+ blood type from previous visit Discussed at length [place] results of fetal bradycardia and [facility]. Patient questions answered. Will follow-up with another Korea in 1 week.  Assessment and Plan  A: SIUP at [redacted]w[redacted]d Small subchorionic hemorrhage Fetal bradycardia Viral URI  P: Discharge home Bleeding precautions discussed Outpatient Korea ordered for 1 week. Patient will return to MAU after Korea for results Patient advised rest, increased PO hydration and list of OTC medications for symptomatic relief given Pelvic  rest advised Patient may return to MAU as needed or if her condition were to change or worsen sooner  Marny Lowenstein, PA-C  12/03/2014, 6:18 AM

## 2014-12-03 NOTE — MAU Note (Signed)
Pt reports she has felt sick all day and having urinary frequency. Tonight she began having vaginal bleeding. Left lower abd pain started at the same time the bleeding started.

## 2014-12-03 NOTE — Discharge Instructions (Signed)
Pelvic Rest °Pelvic rest is sometimes recommended for women when:  °· The placenta is partially or completely covering the opening of the cervix (placenta previa). °· There is bleeding between the uterine wall and the amniotic sac in the first trimester (subchorionic hemorrhage). °· The cervix begins to open without labor starting (incompetent cervix, cervical insufficiency). °· The labor is too early (preterm labor). °HOME CARE INSTRUCTIONS °· Do not have sexual intercourse, stimulation, or an orgasm. °· Do not use tampons, douche, or put anything in the vagina. °· Do not lift anything over 10 pounds (4.5 kg). °· Avoid strenuous activity or straining your pelvic muscles. °SEEK MEDICAL CARE IF:  °· You have any vaginal bleeding during pregnancy. Treat this as a potential emergency. °· You have cramping pain felt low in the stomach (stronger than menstrual cramps). °· You notice vaginal discharge (watery, mucus, or bloody). °· You have a low, dull backache. °· There are regular contractions or uterine tightening. °SEEK IMMEDIATE MEDICAL CARE IF: °You have vaginal bleeding and have placenta previa.  °Document Released: 01/21/2011 Document Revised: 12/19/2011 Document Reviewed: 01/21/2011 °ExitCare® Patient Information ©2015 [employer]. This information is not intended to replace advice given to you by your health care provider. Make sure you discuss any questions you have with your health care provider. ° °Threatened Miscarriage °A threatened miscarriage is when you have vaginal bleeding during your first 20 weeks of pregnancy but the pregnancy has not ended. Your doctor will do tests to make sure you are still pregnant. The cause of the bleeding may not be known. This condition does not mean your pregnancy will end. It does increase the risk of it ending (complete miscarriage). °HOME CARE  °· Make sure you keep all your doctor visits for prenatal care. °· Get plenty of rest. °· Do not have sex or use tampons if  you have vaginal bleeding. °· Do not douche. °· Do not smoke or use drugs. °· Do not drink alcohol. °· Avoid caffeine. °GET HELP IF: °· You have light bleeding from your vagina. °· You have belly pain or cramping. °· You have a fever. °GET HELP RIGHT AWAY IF:  °· You have heavy bleeding from your vagina. °· You have clots of blood coming from your vagina. °· You have bad pain or cramps in your low back or belly. °· You have fever, chills, and bad belly pain. °MAKE SURE YOU:  °· Understand these instructions. °· Will watch your condition. °· Will get help right away if you are not doing well or get worse. °Document Released: 09/08/2008 Document Revised: 10/01/2013 Document Reviewed: 07/23/2013 °ExitCare® Patient Information ©2015 [employer]. This information is not intended to replace advice given to you by your health care provider. Make sure you discuss any questions you have with your health care provider. ° °

## 2014-12-04 ENCOUNTER — Ambulatory Visit (HOSPITAL_COMMUNITY): Payer: Medicaid Other

## 2014-12-09 ENCOUNTER — Inpatient Hospital Stay (HOSPITAL_COMMUNITY)
Admission: AD | Admit: 2014-12-09 | Discharge: 2014-12-09 | Disposition: A | Discharge status: Home / Self Care | Payer: Medicaid Other | Source: Ambulatory Visit | Attending: Family Medicine | Admitting: Family Medicine

## 2014-12-09 ENCOUNTER — Other Ambulatory Visit (HOSPITAL_COMMUNITY)
Admission: RE | Admit: 2014-12-09 | Discharge: 2014-12-09 | Disposition: A | Discharge status: Home / Self Care | Payer: Medicaid Other | Source: Ambulatory Visit | Attending: Medical | Admitting: Medical

## 2014-12-09 ENCOUNTER — Ambulatory Visit (HOSPITAL_COMMUNITY)
Admission: RE | Admit: 2014-12-09 | Discharge: 2014-12-09 | Disposition: A | Discharge status: Home / Self Care | Payer: Medicaid Other | Source: Ambulatory Visit | Attending: Medical | Admitting: Medical

## 2014-12-09 DIAGNOSIS — Z3A01 Less than 8 weeks gestation of pregnancy: Secondary | ICD-10-CM | POA: Diagnosis not present

## 2014-12-09 DIAGNOSIS — O208 Other hemorrhage in early pregnancy: Secondary | ICD-10-CM | POA: Insufficient documentation

## 2014-12-09 DIAGNOSIS — O021 Missed abortion: Secondary | ICD-10-CM | POA: Diagnosis not present

## 2014-12-09 DIAGNOSIS — Z87891 Personal history of nicotine dependence: Secondary | ICD-10-CM | POA: Insufficient documentation

## 2014-12-09 DIAGNOSIS — O209 Hemorrhage in early pregnancy, unspecified: Secondary | ICD-10-CM

## 2014-12-09 LAB — HCG, QUANTITATIVE, PREGNANCY: HCG, BETA CHAIN, QUANT, S: 10879 m[IU]/mL — AB (ref <5)

## 2014-12-09 NOTE — MAU Provider Note (Signed)
Chief Complaint: No chief complaint on file.    SUBJECTIVE HPI: Amy Little is a 24 y.o. G2P1001 at 5358w1d by LMP who presents for follow-up ultrasound. She's had vaginal bleeding like a period and minimal suprapubic cramping. No apparent tissue passed. No heavy bleeding or orthostatic symptoms. Minimal intermittent abdominal cramping. She presented here on 12/03/2014 4 vaginal bleeding at 528w4dby LMP. US showed CRL c/w 871w2d, FHR 51 and small SCH. She was seen for bleeding one week prior to that with an ultrasound that showed 8841w5d IUGS.  Pregnancy is desired.  Past Medical History  Diagnosis Date  . Acute URI   . Asthma    OB History  Gravida Para Term Preterm AB SAB TAB Ectopic Multiple Living  2 1 1       1     # Outcome Date GA Lbr Len/2nd Weight Sex Delivery Anes PTL Lv  2 Current           1 Term 2012    M Vag-Spont   Y     Past Surgical History  Procedure Laterality Date  . Wisdom tooth extraction     History   Social History  . Marital Status: Single    Spouse Name: N/A  . Number of Children: N/A  . Years of Education: N/A   Occupational History  . Not on file.   Social History Main Topics  . Smoking status: Former Smoker -- 1.00 packs/day    Quit date: 11/09/2014  . Smokeless tobacco: Never Used  . Alcohol Use: Yes     Comment: 1 beer/month  . Drug Use: No  . Sexual Activity: Yes    Birth Control/ Protection: None   Other Topics Concern  . Not on file   Social History Narrative   No current facility-administered medications on file prior to encounter.   Current Outpatient Prescriptions on File Prior to Encounter  Medication Sig Dispense Refill  . acetaminophen (TYLENOL) 500 MG tablet Take 500 mg by mouth every 6 (six) hours as needed.    Marland Kitchen. albuterol (PROVENTIL HFA;VENTOLIN HFA) 108 (90 BASE) MCG/ACT inhaler Inhale 2 puffs into the lungs every 4 (four) hours as needed for wheezing or shortness of breath. 1 Inhaler 0  . Prenatal Vit-Fe Fumarate-FA  (PRENATAL MULTIVITAMIN) TABS tablet Take 1 tablet by mouth daily at 12 noon.     No Known Allergies  ROS: Pertinent items in HPI  OBJECTIVE Last menstrual period 10/04/2014. GENERAL: Well-developed, well-nourished female in no acute distress.  HEENT: Normocephalic HEART: normal rate RESP: normal effort ABDOMEN: Soft, non-tender EXTREMITIES: Nontender, no edema NEURO: Alert and oriented  LAB RESULTS Results for orders placed or performed during the hospital encounter of 12/09/14 (from the past 24 hour(s))  hCG, quantitative, pregnancy     Status: Abnormal   Collection Time: 12/09/14  6:25 PM  Result Value Ref Range   hCG, Beta Chain, Quant, S 10879 (H) <5 mIU/mL        Ref Range 6:25 PM  13d ago  3wk ago     hCG, Beta Chain, Quant, S <5 mIU/mL 10879 (H) 6170 (H)CM 1004 (H)CM   Comments:               IMAGING [place] Ob Comp Less 14 Wks  11/12/2014   CLINICAL DATA:  First trimester pregnancy with vaginal bleeding today. Quadrant pain. LMP 10/04/2014. Initial encounter.  EXAM: OBSTETRIC <14 WK US AND TRANSVAGINAL OB US  TECHNIQUE: Both transabdominal and transvaginal ultrasound examinations were performed  for complete evaluation of the gestation as well as the maternal uterus, adnexal regions, and pelvic cul-de-sac. Transvaginal technique was performed to assess early pregnancy.  COMPARISON:  None.  FINDINGS: Intrauterine gestational sac: There is a small cystic structure within the endometrial cavity, likely a small gestational sac.  Yolk sac:  Not visualized.  Embryo:  Not visualized.  MSD:  2.5  mm   5 w   0  d              [place] EDC: 07/15/2015  Maternal uterus/adnexae: No evidence of subchorionic hematoma or adnexal mass. The right ovary appears normal. There is a probable small corpus luteum on the left.  IMPRESSION: Probable early intrauterine gestational sac, but no yolk sac, fetal pole, or cardiac activity yet visualized. Recommend follow-up quantitative B-HCG levels and  follow-up US in 14 days to confirm and assess viability. This recommendation follows SRU consensus guidelines: Diagnostic Criteria for Nonviable Pregnancy Early in the First Trimester. Malva Limes Med 2013; 161:0960-45.   Electronically Signed   By: Roxy Horseman M.D.   On: 11/12/2014 12:27   US Ob Transvaginal  12/09/2014   CLINICAL DATA:  Fetal bradycardia  EXAM: TRANSVAGINAL OB ULTRASOUND  TECHNIQUE: Transvaginal ultrasound was performed for complete evaluation of the gestation as well as the maternal uterus, adnexal regions, and pelvic cul-de-sac.  COMPARISON:  12/03/2014  FINDINGS: Intrauterine gestational sac: Visualized/normal in shape.  Yolk sac:  Visualized  Embryo:  Not definitively visualized on today's study  Cardiac Activity: Not visualize  Heart Rate:  bpm  MSD: 15.4  mm   6 w   3  d  CRL:     mm    w  d                  [place] EDC:  Maternal uterus/adnexae: Small subchorionic hemorrhage. No adnexal masses or free fluid.  IMPRESSION: Previously seen fetal pole and cardiac activity on prior study is not definitively seen on today's study. Small subchorionic hemorrhage.  Findings are suspicious but not yet definitive for failed pregnancy. Recommend follow-up US in 10-14 days for definitive diagnosis. This recommendation follows SRU consensus guidelines: Diagnostic Criteria for Nonviable Pregnancy Early in the First Trimester. Malva Limes Med 2013; 409:8119-14.   Electronically Signed   By: Charlett Nose M.D.   On: 12/09/2014 13:16   US Ob Transvaginal  12/03/2014   CLINICAL DATA:  Pregnant patient with vaginal bleeding and left lower abdominal pain.  EXAM: TRANSVAGINAL OB ULTRASOUND  TECHNIQUE: Transvaginal ultrasound was performed for complete evaluation of the gestation as well as the maternal uterus, adnexal regions, and pelvic cul-de-sac.  COMPARISON:  Ultrasound 11/26/2014  FINDINGS: Intrauterine gestational sac: Visualized/normal in shape.  Yolk sac:  Present.  Prominent in size measuring 8.5 mm.  Embryo:   Present.  Cardiac Activity: Present.  Heart Rate: 51 bpm  MSD: 12.4   mm   6 w   0  d  CRL:   4.8   mm   6 w 2 d                  [place] EDC: 07/11/2015  Maternal uterus/adnexae: Small subchorionic hemorrhage. Right ovary measures 3.2 x 1.5 x 2.9 cm with normal blood flow. Left ovary measures 4.0 x 2.7 x 2.1 cm with normal blood flow. Trace pelvic free fluid.  IMPRESSION: Since the prior exam there has been development of a fetal pole with fetal heart tones within the  intrauterine gestational sac. However fetal heart rate is low at 51 beats per minute and yolk sac appears prominent. There is a small amount of subchorionic hemorrhage. Estimated gestational age of [redacted] weeks 2 days for estimated date of delivery 07/11/2015. Close sonographic follow-up is recommended.   Electronically Signed   By: Rubye Oaks M.D.   On: 12/03/2014 05:55   US Ob Transvaginal  11/27/2014   CLINICAL DATA:  Pregnant patient with vaginal bleeding since 11/10/2014.  EXAM: TRANSVAGINAL OB ULTRASOUND  TECHNIQUE: Transvaginal ultrasound was performed for complete evaluation of the gestation as well as the maternal uterus, adnexal regions, and pelvic cul-de-sac.  COMPARISON:  OB ultrasound 11/12/2014.  FINDINGS: Intrauterine gestational sac: Visualized/normal in shape.  Yolk sac:  Visualized.  Embryo:  Not visualized.  Cardiac Activity: Not applicable.  MSD: 10.5  mm   5 w   5  d     [place] EDC: 07/24/2015.  Maternal uterus/adnexae: Unremarkable.  IMPRESSION: Intrauterine gestational sac with a yolk sac visualized but no fetal pole this time likely due to early gestational age. Negative for ectopic pregnancy or other acute abnormality.   Electronically Signed   By: Drusilla Kanner M.D.   On: 11/27/2014 00:13   US Ob Transvaginal  11/12/2014   CLINICAL DATA:  First trimester pregnancy with vaginal bleeding today. Quadrant pain. LMP 10/04/2014. Initial encounter.  EXAM: OBSTETRIC <14 WK [place] AND TRANSVAGINAL OB US  TECHNIQUE: Both transabdominal  and transvaginal ultrasound examinations were performed for complete evaluation of the gestation as well as the maternal uterus, adnexal regions, and pelvic cul-de-sac. Transvaginal technique was performed to assess early pregnancy.  COMPARISON:  None.  FINDINGS: Intrauterine gestational sac: There is a small cystic structure within the endometrial cavity, likely a small gestational sac.  Yolk sac:  Not visualized.  Embryo:  Not visualized.  MSD:  2.5  mm   5 w   0  d              [place] EDC: 07/15/2015  Maternal uterus/adnexae: No evidence of subchorionic hematoma or adnexal mass. The right ovary appears normal. There is a probable small corpus luteum on the left.  IMPRESSION: Probable early intrauterine gestational sac, but no yolk sac, fetal pole, or cardiac activity yet visualized. Recommend follow-up quantitative B-HCG levels and follow-up US in 14 days to confirm and assess viability. This recommendation follows SRU consensus guidelines: Diagnostic Criteria for Nonviable Pregnancy Early in the First Trimester. Malva Limes Med 2013; 161:0960-45.   Electronically Signed   By: Roxy Horseman M.D.   On: 11/12/2014 12:27    MAU COURSE Quantitative beta-hCG sent  Discussed likely pregnancy failure and discussed options > wishes to proceed with cytotec if EPF C/W Dr. Adrian Blackwater > repeat quant in 48 hours  ASSESSMENT 1. Missed abortion   Probable EPF  PLAN Discharge home with bleeding precautions    Medication List    TAKE these medications        acetaminophen 500 MG tablet  Commonly known as:  TYLENOL  Take 500 mg by mouth every 6 (six) hours as needed.     albuterol 108 (90 BASE) MCG/ACT inhaler  Commonly known as:  PROVENTIL HFA;VENTOLIN HFA  Inhale 2 puffs into the lungs every 4 (four) hours as needed for wheezing or shortness of breath.     prenatal multivitamin Tabs tablet  Take 1 tablet by mouth daily at 12 noon.       Follow-up Information    Follow  up with [facility].    Specialty:  Obstetrics and Gynecology   Why:  Someone from Clinic will call you with appt.   Contact information:   [address] [place] [address] [phone]      Danae Orleans, CNM 12/09/2014  1:58 PM

## 2014-12-09 NOTE — MAU Note (Signed)
Pt left before BHCG drawn, phone call to pt., she states she will come back in today for the lab draw.  Outpatient order for BHCG taken to lab.

## 2014-12-11 ENCOUNTER — Other Ambulatory Visit: Payer: Medicaid Other

## 2014-12-11 ENCOUNTER — Ambulatory Visit: Payer: Medicaid Other | Admitting: Family Medicine

## 2014-12-11 DIAGNOSIS — O021 Missed abortion: Secondary | ICD-10-CM

## 2014-12-11 NOTE — Progress Notes (Unsigned)
Pt came in for repeat beta hcg today. Patient reports that she is not in pain, just some mild cramping. She informed me that she is going to take cytotec tonight. I spoke with Dr. Adrian BlackwaterStinson about if the order needed to be stat and he stated that it can be sent regular priority and that patient should not take cytotec until we get results back. Patient left before I could inform her of this. Called patient cell phone and left message for her to call back and speak with me directly. I will call patient again if I have not heard from her.   Pt returned call, I informed her to not take cytotec until she hears from us tomorrow. Ordered under Dr. Adrian BlackwaterStinson, he states that he will call patient tomorrow.

## 2014-12-12 LAB — HCG, QUANTITATIVE, PREGNANCY: hCG, Beta Chain, Quant, S: 10135.6 m[IU]/mL

## 2014-12-12 NOTE — Progress Notes (Signed)
Quick Note:  Called patient twice, left message to take medication and to call if any questions or problems or to follow up in MAU. ______

## 2014-12-24 ENCOUNTER — Other Ambulatory Visit: Payer: Medicaid Other

## 2014-12-24 ENCOUNTER — Inpatient Hospital Stay (HOSPITAL_COMMUNITY)
Admission: AD | Admit: 2014-12-24 | Discharge: 2014-12-24 | Disposition: A | Discharge status: Home / Self Care | Payer: Medicaid Other | Source: Ambulatory Visit | Attending: Obstetrics & Gynecology | Admitting: Obstetrics & Gynecology

## 2014-12-24 ENCOUNTER — Telehealth: Payer: Self-pay

## 2014-12-24 ENCOUNTER — Encounter: Payer: Medicaid Other | Admitting: Family Medicine

## 2014-12-24 NOTE — Telephone Encounter (Signed)
Per Dr. Adrian BlackwaterStinson, patient needs to have lab appointment for HCG. Attempted to contact patient. No answer. Left message stating we are trying to reach you to schedule a lab draw, please call clinic.

## 2014-12-25 NOTE — Telephone Encounter (Signed)
Called patient, no answer- left message stating we are trying to reach you to schedule an appt, please call us back at the clinics option 3 for the nurseline

## 2014-12-31 NOTE — Telephone Encounter (Signed)
Attempted to contact patient. No answer. Left message stating we are trying to reach you regarding an important appointment, please call clinic.

## 2015-01-05 NOTE — Telephone Encounter (Signed)
Called patient and discussed the need for HCG draw. Patient states she can come in tomorrow 01/06/15 at 1300. Informed her she will be put on the schedule and that results will be discussed at visit on 4/1. Pt.verbalized understanding and gratitude. No questions or concerns.

## 2015-01-06 ENCOUNTER — Other Ambulatory Visit: Payer: Medicaid Other

## 2015-01-06 DIAGNOSIS — Z3200 Encounter for pregnancy test, result unknown: Secondary | ICD-10-CM

## 2015-01-07 ENCOUNTER — Telehealth: Payer: Self-pay

## 2015-01-07 LAB — HCG, QUANTITATIVE, PREGNANCY: HCG, BETA CHAIN, QUANT, S: 4.9 m[IU]/mL

## 2015-01-07 NOTE — Telephone Encounter (Signed)
-----   Message from Tereso NewcomerUgonna A Anyanwu, MD sent at 01/07/2015  9:53 AM EDT ----- Completed miscarriage, no further labs needed. Patient needs to come in for follow up appointment with any provider to discuss future pregnancy plans, contraception etc.  Please call to inform patient of results and recommendations.

## 2015-01-07 NOTE — Telephone Encounter (Signed)
Called patient. No answer. Left message stating we are calling to inform you of results as well as remind you of appointment on 4/1 at 0815, please call clinic with any questions or concerns. Spoke with patient on 3/28 and she stated she would be coming to appointment Friday.

## 2015-01-08 ENCOUNTER — Other Ambulatory Visit: Payer: Medicaid Other

## 2015-01-09 ENCOUNTER — Encounter: Payer: Medicaid Other | Admitting: Family Medicine

## 2015-01-12 ENCOUNTER — Emergency Department (HOSPITAL_BASED_OUTPATIENT_CLINIC_OR_DEPARTMENT_OTHER): Payer: Medicaid Other

## 2015-01-12 ENCOUNTER — Emergency Department (HOSPITAL_BASED_OUTPATIENT_CLINIC_OR_DEPARTMENT_OTHER)
Admission: EM | Admit: 2015-01-12 | Discharge: 2015-01-12 | Disposition: A | Discharge status: Home / Self Care | Payer: Medicaid Other | Attending: Emergency Medicine | Admitting: Emergency Medicine

## 2015-01-12 ENCOUNTER — Encounter (HOSPITAL_BASED_OUTPATIENT_CLINIC_OR_DEPARTMENT_OTHER): Payer: Self-pay | Admitting: Emergency Medicine

## 2015-01-12 DIAGNOSIS — Y998 Other external cause status: Secondary | ICD-10-CM | POA: Diagnosis not present

## 2015-01-12 DIAGNOSIS — Y9389 Activity, other specified: Secondary | ICD-10-CM | POA: Insufficient documentation

## 2015-01-12 DIAGNOSIS — Z87891 Personal history of nicotine dependence: Secondary | ICD-10-CM | POA: Insufficient documentation

## 2015-01-12 DIAGNOSIS — M533 Sacrococcygeal disorders, not elsewhere classified: Secondary | ICD-10-CM

## 2015-01-12 DIAGNOSIS — S3992XA Unspecified injury of lower back, initial encounter: Secondary | ICD-10-CM | POA: Insufficient documentation

## 2015-01-12 DIAGNOSIS — W010XXA Fall on same level from slipping, tripping and stumbling without subsequent striking against object, initial encounter: Secondary | ICD-10-CM | POA: Insufficient documentation

## 2015-01-12 DIAGNOSIS — Y9289 Other specified places as the place of occurrence of the external cause: Secondary | ICD-10-CM | POA: Insufficient documentation

## 2015-01-12 DIAGNOSIS — Z79899 Other long term (current) drug therapy: Secondary | ICD-10-CM | POA: Diagnosis not present

## 2015-01-12 DIAGNOSIS — J45909 Unspecified asthma, uncomplicated: Secondary | ICD-10-CM | POA: Insufficient documentation

## 2015-01-12 NOTE — ED Notes (Signed)
Patient transported to X-ray 

## 2015-01-12 NOTE — Discharge Instructions (Signed)
You may take ibuprofen or tylenol for pain. Apply ice intermittently, 15 minutes at a time.  Tailbone Injury The tailbone (coccyx) is the small bone at the lower end of the spine. A tailbone injury may involve stretched ligaments, bruising, or a broken bone (fracture). Women are more vulnerable to this injury due to having a wider pelvis. CAUSES  This type of injury typically occurs from falling and landing on the tailbone. Repeated strain or friction from actions such as rowing and bicycling may also injure the area. The tailbone can be injured during childbirth. Infections or tumors may also press on the tailbone and cause pain. Sometimes, the cause of injury is unknown. SYMPTOMS   Bruising.  Pain when sitting.  Painful bowel movements.  In women, pain during intercourse. DIAGNOSIS  Your caregiver can diagnose a tailbone injury based on your symptoms and a physical exam. X-rays may be taken if a fracture is suspected. Your caregiver may also use an MRI scan imaging test to evaluate your symptoms. TREATMENT  Your caregiver may prescribe medicines to help relieve your pain. Most tailbone injuries heal on their own in 4 to 6 weeks. However, if the injury is caused by an infection or tumor, the recovery period may vary. PREVENTION  Wear appropriate padding and sports gear when bicycling and rowing. This can help prevent an injury from repeated strain or friction. HOME CARE INSTRUCTIONS   Put ice on the injured area.  Put ice in a plastic bag.  Place a towel between your skin and the bag.  Leave the ice on for 15-20 minutes, every hour while awake for the first 1 to 2 days.  Sit on a large, rubber or inflated ring or cushion to ease your pain. Lean forward when sitting to help decrease discomfort.  Avoid sitting for long periods of time.  Increase your activity as the pain allows.  Only take over-the-counter or prescription medicines for pain, discomfort, or fever as directed by  your caregiver.  You may use stool softeners if it is painful to have a bowel movement, or as directed by your caregiver.  Eat a diet with plenty of fiber to help prevent constipation.  Keep all follow-up appointments as directed by your caregiver. SEEK MEDICAL CARE IF:   Your pain becomes worse.  Your bowel movements cause a great deal of discomfort.  You are unable to have a bowel movement.  You have a fever. MAKE SURE YOU:  Understand these instructions.  Will watch your condition.  Will get help right away if you are not doing well or get worse. Document Released: 09/23/2000 Document Revised: 12/19/2011 Document Reviewed: 04/21/2011 [facility] Patient Information 2015 [place]. This information is not intended to replace advice given to you by your health care provider. Make sure you discuss any questions you have with your health care provider.

## 2015-01-12 NOTE — ED Notes (Signed)
Pt fell down a flight of stairs onto her tail bone.  Pt c/o pain to coccyx area.  Pt able to pass urine but unable to prevent passing gas due to pain.  Pt able to ambulate.  No numbness or tingling in legs.

## 2015-01-12 NOTE — ED Provider Notes (Signed)
CSN: [id number]     Arrival date & time 01/12/15  1043 History   First MD Initiated Contact with Patient 01/12/15 1050     Chief Complaint  Patient presents with  . Fall     (Consider location/radiation/quality/duration/timing/severity/associated sxs/prior Treatment) HPI Comments: 24 year old female complaining of tailbone pain after a mechanical fall down a flight of stairs yesterday evening. Reports she landed directly onto her tailbone. In the middle of the night, she woke up in pain, took hydrocodone which made her go to sleep. Pain currently 7/10, worse with pressure and walking. Denies numbness or tingling down extremities. Denies loss of control of bladder or saddle anesthesia. Reports she cannot control passing gas but is not incontinent of bowels.  Patient is a 24 y.o. female presenting with fall. The history is provided by the patient.  Fall    Past Medical History  Diagnosis Date  . Acute URI   . Asthma    Past Surgical History  Procedure Laterality Date  . Wisdom tooth extraction     No family history on file. History  Substance Use Topics  . Smoking status: Former Smoker -- 1.00 packs/day    Quit date: 11/09/2014  . Smokeless tobacco: Never Used  . Alcohol Use: Yes     Comment: 1 beer/month   OB History    Gravida Para Term Preterm AB TAB SAB Ectopic Multiple Living   Review of Systems  Musculoskeletal:       +Tailbone pain.  All other systems reviewed and are negative.     Allergies  Review of patient's allergies indicates no known allergies.  Home Medications   Prior to Admission medications   Medication Sig Start Date End Date Taking? Authorizing Provider  albuterol (PROVENTIL HFA;VENTOLIN HFA) 108 (90 BASE) MCG/ACT inhaler Inhale 2 puffs into the lungs every 4 (four) hours as needed for wheezing or shortness of breath. 05/03/14   Paula Libra, MD  Prenatal Vit-Fe Fumarate-FA (PRENATAL MULTIVITAMIN) TABS tablet Take 1 tablet by  mouth daily at 12 noon.    Historical Provider, MD   BP 129/78 mmHg  Pulse 58  Temp(Src) 98.4 F (36.9 C) (Oral)  Resp 16  Ht  (1.702 m)  Wt 155 lb (70.308 kg)  BMI 24.27 kg/m2  SpO2 100%  LMP 10/04/2014  Breastfeeding? Unknown Physical Exam  Constitutional: She is oriented to person, place, and time. She appears well-developed and well-nourished. No distress.  HENT:  Head: Normocephalic and atraumatic.  Mouth/Throat: Oropharynx is clear and moist.  Eyes: Conjunctivae are normal.  Neck: Normal range of motion. Neck supple. No spinous process tenderness and no muscular tenderness present.  Cardiovascular: Normal rate, regular rhythm and normal heart sounds.   Pulmonary/Chest: Effort normal and breath sounds normal. No respiratory distress.  Genitourinary:  Good rectal tone. Sensation intact.  Musculoskeletal: She exhibits no edema.  TTP over sacrum and coccyx. No bruising or edema.  Neurological: She is alert and oriented to person, place, and time. She has normal strength.  Strength lower extremities 5/5 and equal bilateral. Sensation intact. Normal gait.  Skin: Skin is warm and dry. No rash noted. She is not diaphoretic.  Psychiatric: She has a normal mood and affect. Her behavior is normal.  Nursing note and vitals reviewed.   ED Course  Procedures (including critical care time) Labs Review Labs Reviewed - No data to display  Imaging Review Dg Sacrum/coccyx  01/12/2015  CLINICAL DATA:  Pain following fall  EXAM: SACRUM AND COCCYX - 2+ VIEW  COMPARISON:  None.  FINDINGS: Frontal and lateral views were obtained. No fracture or diastases. Joint spaces appear intact. No erosive change.  IMPRESSION: No fracture or diastases.  No appreciable arthropathy.   Electronically Signed   By: Bretta BangWilliam  Woodruff III M.D.   On: 01/12/2015 11:37     EKG Interpretation None      MDM   Final diagnoses:  Coccyalgia  Fall from slip, trip, or stumble, initial encounter    Tailbone pain after mechanical fall. No red flags concerning patient's coccyx pain. No s/s of central cord compression or cauda equina. Lower extremities are neurovascularly intact and patient is ambulating without difficulty. Xray negative. Advised rest, ice, NSAIDs. Stable for d/c. Return precautions given. Patient states understanding of treatment care plan and is agreeable.  Kathrynn SpeedRobyn M Bayle Calvo, PA-C 01/12/15 1154  Eber HongBrian Miller, MD 01/12/15 2033

## 2015-02-05 ENCOUNTER — Emergency Department (HOSPITAL_BASED_OUTPATIENT_CLINIC_OR_DEPARTMENT_OTHER)
Admission: EM | Admit: 2015-02-05 | Discharge: 2015-02-05 | Disposition: A | Discharge status: Home / Self Care | Payer: Medicaid Other | Attending: Emergency Medicine | Admitting: Emergency Medicine

## 2015-02-05 ENCOUNTER — Encounter (HOSPITAL_BASED_OUTPATIENT_CLINIC_OR_DEPARTMENT_OTHER): Payer: Self-pay | Admitting: *Deleted

## 2015-02-05 DIAGNOSIS — J45909 Unspecified asthma, uncomplicated: Secondary | ICD-10-CM | POA: Diagnosis not present

## 2015-02-05 DIAGNOSIS — Z87891 Personal history of nicotine dependence: Secondary | ICD-10-CM | POA: Insufficient documentation

## 2015-02-05 DIAGNOSIS — Z3202 Encounter for pregnancy test, result negative: Secondary | ICD-10-CM | POA: Diagnosis not present

## 2015-02-05 DIAGNOSIS — N390 Urinary tract infection, site not specified: Secondary | ICD-10-CM | POA: Diagnosis not present

## 2015-02-05 DIAGNOSIS — R300 Dysuria: Secondary | ICD-10-CM | POA: Diagnosis present

## 2015-02-05 LAB — URINE MICROSCOPIC-ADD ON

## 2015-02-05 LAB — URINALYSIS, ROUTINE W REFLEX MICROSCOPIC
Bilirubin Urine: NEGATIVE
Glucose, UA: NEGATIVE mg/dL
Ketones, ur: 15 mg/dL — AB
Nitrite: NEGATIVE
Protein, ur: 30 mg/dL — AB
Specific Gravity, Urine: 1.034 — ABNORMAL HIGH (ref 1.005–1.030)
UROBILINOGEN UA: 1 mg/dL (ref 0.0–1.0)
pH: 6.5 (ref 5.0–8.0)

## 2015-02-05 LAB — PREGNANCY, URINE: PREG TEST UR: NEGATIVE

## 2015-02-05 MED ORDER — PHENAZOPYRIDINE HCL 100 MG PO TABS
200.0000 mg | ORAL_TABLET | Freq: Once | ORAL | Status: AC
  Administered 2015-02-05: 200 mg via ORAL
  Filled 2015-02-05: qty 2

## 2015-02-05 MED ORDER — NITROFURANTOIN MONOHYD MACRO 100 MG PO CAPS: 100.0000 mg | ORAL_CAPSULE | Freq: Two times a day (BID) | ORAL | Status: DC

## 2015-02-05 MED ORDER — NITROFURANTOIN MONOHYD MACRO 100 MG PO CAPS
100.0000 mg | ORAL_CAPSULE | Freq: Once | ORAL | Status: AC
  Administered 2015-02-05: 100 mg via ORAL
  Filled 2015-02-05: qty 1

## 2015-02-05 MED ORDER — PHENAZOPYRIDINE HCL 200 MG PO TABS: 200.0000 mg | ORAL_TABLET | Freq: Three times a day (TID) | ORAL | Status: DC

## 2015-02-05 NOTE — ED Provider Notes (Signed)
CSN: [id number]     Arrival date & time 02/05/15  2157 History  This chart was scribed for Hoa Deriso, MD by Phillis Haggis, ED Scribe. This patient was seen in room MH02/MH02 and patient care was started at 11:12 PM.     Chief Complaint  Patient presents with  . Urinary Tract Infection   Patient is a 24 y.o. female presenting with urinary tract infection. The history is provided by the patient. No language interpreter was used.  Urinary Tract Infection This is a recurrent problem. The current episode started 12 to 24 hours ago. The problem occurs constantly. The problem has not changed since onset.Pertinent negatives include no chest pain, no abdominal pain, no headaches and no shortness of breath. Nothing aggravates the symptoms. Nothing relieves the symptoms. Treatments tried: augmentin. The treatment provided no relief.    HPI Comments: Amy Little is a 24 y.o. female who presents to the Emergency Department complaining of painful urination, frequency, and hesitancy onset earlier today. She states that she has not had symptoms like this before. She states that she took amoxicillin PTA. She denies any new medications or use of any new products that could have caused these symptoms.   Past Medical History  Diagnosis Date  . Acute URI   . Asthma    Past Surgical History  Procedure Laterality Date  . Wisdom tooth extraction     History reviewed. No pertinent family history. History  Substance Use Topics  . Smoking status: Former Smoker -- 1.00 packs/day    Quit date: 11/09/2014  . Smokeless tobacco: Never Used  . Alcohol Use: Yes     Comment: 1 beer/month   OB History    Gravida Para Term Preterm AB TAB SAB Ectopic Multiple Living   Review of Systems  Constitutional: Negative for fever.  Respiratory: Negative for shortness of breath.   Cardiovascular: Negative for chest pain.  Gastrointestinal: Negative for vomiting and abdominal pain.  Genitourinary:  Positive for dysuria, urgency and frequency. Negative for flank pain.  Neurological: Negative for headaches.  All other systems reviewed and are negative.  Allergies  Review of patient's allergies indicates no known allergies.  Home Medications   Prior to Admission medications   Not on File   BP 125/71 mmHg  Pulse 84  Temp(Src) 98.3 F (36.8 C) (Oral)  Resp 20  Ht  (1.727 m)  Wt 158 lb (71.668 kg)  BMI 24.03 kg/m2  LMP 01/22/2015   Physical Exam  Constitutional: She is oriented to person, place, and time. She appears well-developed and well-nourished. No distress.  HENT:  Head: Normocephalic and atraumatic.  Mouth/Throat: Oropharynx is clear and moist. No oropharyngeal exudate.  Eyes: Pupils are equal, round, and reactive to light.  Neck: Normal range of motion. Neck supple.  Cardiovascular: Normal rate, regular rhythm and normal heart sounds.   Pulmonary/Chest: Effort normal and breath sounds normal. No respiratory distress. She has no wheezes. She has no rales.  Abdominal: Soft. Bowel sounds are normal. She exhibits no mass. There is no tenderness. There is no rebound and no guarding.  Musculoskeletal: Normal range of motion.  Neurological: She is alert and oriented to person, place, and time.  Skin: Skin is warm and dry.    ED Course  Procedures (including critical care time) DIAGNOSTIC STUDIES: Oxygen Saturation is 100% on room air, normal by my interpretation.    COORDINATION OF CARE: 11:14  PM-Discussed treatment plan which includes anti-biotics with pt at bedside and pt agreed to plan.   Labs Review Labs Reviewed  URINALYSIS, ROUTINE W REFLEX MICROSCOPIC - Abnormal; Notable for the following:    APPearance TURBID (*)    Specific Gravity, Urine 1.034 (*)    Hgb urine dipstick MODERATE (*)    Ketones, ur 15 (*)    Protein, ur 30 (*)    Leukocytes, UA LARGE (*)    All other components within normal limits  URINE MICROSCOPIC-ADD ON - Abnormal; Notable for  the following:    Squamous Epithelial / LPF FEW (*)    Bacteria, UA FEW (*)    All other components within normal limits  PREGNANCY, URINE    Imaging Review No results found.   EKG Interpretation None      MDM   Final diagnoses:  None   UTI: will treat with macrobid x 7 days and pyridium.  No contact lenses while taking pyridium.  Follow up with your PMD return for fevers weakness vomiting or any concerns.    I personally performed the services described in this documentation, which was scribed in my presence. The recorded information has been reviewed and is accurate.     Cy BlamerApril Zendayah Hardgrave, MD 02/06/15 [phone]

## 2015-02-05 NOTE — ED Notes (Signed)
Dysuria today.

## 2015-02-06 ENCOUNTER — Encounter (HOSPITAL_BASED_OUTPATIENT_CLINIC_OR_DEPARTMENT_OTHER): Payer: Self-pay | Admitting: Emergency Medicine

## 2015-03-08 ENCOUNTER — Encounter (HOSPITAL_BASED_OUTPATIENT_CLINIC_OR_DEPARTMENT_OTHER): Payer: Self-pay | Admitting: Emergency Medicine

## 2015-03-08 ENCOUNTER — Emergency Department (HOSPITAL_BASED_OUTPATIENT_CLINIC_OR_DEPARTMENT_OTHER)
Admission: EM | Admit: 2015-03-08 | Discharge: 2015-03-08 | Disposition: A | Discharge status: Home / Self Care | Payer: Medicaid Other | Attending: Emergency Medicine | Admitting: Emergency Medicine

## 2015-03-08 DIAGNOSIS — Z79899 Other long term (current) drug therapy: Secondary | ICD-10-CM | POA: Diagnosis not present

## 2015-03-08 DIAGNOSIS — H9203 Otalgia, bilateral: Secondary | ICD-10-CM | POA: Diagnosis present

## 2015-03-08 DIAGNOSIS — J45909 Unspecified asthma, uncomplicated: Secondary | ICD-10-CM | POA: Insufficient documentation

## 2015-03-08 DIAGNOSIS — H6093 Unspecified otitis externa, bilateral: Secondary | ICD-10-CM | POA: Diagnosis not present

## 2015-03-08 DIAGNOSIS — Z87891 Personal history of nicotine dependence: Secondary | ICD-10-CM | POA: Insufficient documentation

## 2015-03-08 MED ORDER — NEOMYCIN-POLYMYXIN-HC 3.5-10000-1 OT SUSP: 4.0000 [drp] | Freq: Four times a day (QID) | OTIC | Status: DC

## 2015-03-08 NOTE — ED Notes (Signed)
Pt in c/o bilateral ear itching x 1 month, but L ear pain today.

## 2015-03-08 NOTE — Discharge Instructions (Signed)
Use ear drops four times daily for 7 days. Do not use Q-tips in your ears.  Otitis Externa Otitis externa is a bacterial or fungal infection of the outer ear canal. This is the area from the eardrum to the outside of the ear. Otitis externa is sometimes called "swimmer's ear." CAUSES  Possible causes of infection include:  Swimming in dirty water.  Moisture remaining in the ear after swimming or bathing.  Mild injury (trauma) to the ear.  Objects stuck in the ear (foreign body).  Cuts or scrapes (abrasions) on the outside of the ear. SIGNS AND SYMPTOMS  The first symptom of infection is often itching in the ear canal. Later signs and symptoms may include swelling and redness of the ear canal, ear pain, and yellowish-white fluid (pus) coming from the ear. The ear pain may be worse when pulling on the earlobe. DIAGNOSIS  Your health care provider will perform a physical exam. A sample of fluid may be taken from the ear and examined for bacteria or fungi. TREATMENT  Antibiotic ear drops are often given for 10 to 14 days. Treatment may also include pain medicine or corticosteroids to reduce itching and swelling. HOME CARE INSTRUCTIONS   Apply antibiotic ear drops to the ear canal as prescribed by your health care provider.  Take medicines only as directed by your health care provider.  If you have diabetes, follow any additional treatment instructions from your health care provider.  Keep all follow-up visits as directed by your health care provider. PREVENTION   Keep your ear dry. Use the corner of a towel to absorb water out of the ear canal after swimming or bathing.  Avoid scratching or putting objects inside your ear. This can damage the ear canal or remove the protective wax that lines the canal. This makes it easier for bacteria and fungi to grow.  Avoid swimming in lakes, polluted water, or poorly chlorinated pools.  You may use ear drops made of rubbing alcohol and vinegar  after swimming. Combine equal parts of white vinegar and alcohol in a bottle. Put 3 or 4 drops into each ear after swimming. SEEK MEDICAL CARE IF:   You have a fever.  Your ear is still red, swollen, painful, or draining pus after 3 days.  Your redness, swelling, or pain gets worse.  You have a severe headache.  You have redness, swelling, pain, or tenderness in the area behind your ear. MAKE SURE YOU:   Understand these instructions.  Will watch your condition.  Will get help right away if you are not doing well or get worse. Document Released: 09/26/2005 Document Revised: 02/10/2014 Document Reviewed: 10/13/2011 [facility] Patient Information 2015 [employer] [place]. This information is not intended to replace advice given to you by your health care provider. Make sure you discuss any questions you have with your health care provider.  Ear Drops You have been diagnosed with a condition requiring you to put drops of medicine into your outer ear. HOME CARE INSTRUCTIONS   Put drops in the affected ear as instructed. After putting the drops in, you will need to lie down with the affected ear facing up for ten minutes so the drops will remain in the ear canal and run down and fill the canal. Continue using the ear drops for as long as directed by your health care provider.  Prior to getting up, put a cotton ball gently in your ear canal. Leave enough of the cotton ball out so it can  be easily removed. Do not attempt to push this down into the canal with a cotton-tipped swab or other instrument.  Do not irrigate or wash out your ears if you have had a perforated eardrum or mastoid surgery, or unless instructed to do so by your health care provider.  Keep appointments with your health care provider as instructed.  Finish all medicine, or use for the length of time prescribed by your health care provider. Continue the drops even if your problem seems to be doing well after a couple days, or  continue as instructed. SEEK MEDICAL CARE IF:  You become worse or develop increasing pain.  You notice any unusual drainage from your ear (particularly if the drainage has a bad smell).  You develop hearing difficulties.  You experience a serious form of dizziness in which you feel as if the room is spinning, and you feel nauseated (vertigo).  The outside of your ear becomes red or swollen or both. This may be a sign of an allergic reaction. MAKE SURE YOU:   Understand these instructions.  Will watch your condition.  Will get help right away if you are not doing well or get worse. Document Released: 09/20/2001 Document Revised: 10/01/2013 Document Reviewed: 04/23/2013 [facility] Patient Information 2015 [place], [place]. This information is not intended to replace advice given to you by your health care provider. Make sure you discuss any questions you have with your health care provider.

## 2015-03-08 NOTE — ED Provider Notes (Signed)
CSN: [id number]     Arrival date & time 03/08/15  2123 History   First MD Initiated Contact with Patient 03/08/15 2217     Chief Complaint  Patient presents with  . Otalgia     (Consider location/radiation/quality/duration/timing/severity/associated sxs/prior Treatment) HPI Comments: 24 year old female complaining of bilateral ear itching 1 month with associated liquidy drainage, and today her left ear started hurting, and radiating throughout her jaw. She has been using Q-tips which are not helping the pain. Denies fevers. Denies any dental issue.  Patient is a 24 y.o. female presenting with ear pain. The history is provided by the patient.  Otalgia Associated symptoms: ear discharge     Past Medical History  Diagnosis Date  . Acute URI   . Asthma    Past Surgical History  Procedure Laterality Date  . Wisdom tooth extraction     History reviewed. No pertinent family history. History  Substance Use Topics  . Smoking status: Former Smoker -- 1.00 packs/day    Quit date: 11/09/2014  . Smokeless tobacco: Never Used  . Alcohol Use: Yes     Comment: 1 beer/month   OB History    Gravida Para Term Preterm AB TAB SAB Ectopic Multiple Living   2 1 1       1      Review of Systems  HENT: Positive for ear discharge and ear pain.   All other systems reviewed and are negative.     Allergies  Review of patient's allergies indicates no known allergies.  Home Medications   Prior to Admission medications   Medication Sig Start Date End Date Taking? Authorizing Provider  neomycin-polymyxin-hydrocortisone (CORTISPORIN) 3.5-10000-1 otic suspension Place 4 drops into both ears 4 (four) times daily. X 7 days 03/08/15   Kathrynn Speedobyn M Valyn Latchford, PA-C  nitrofurantoin, macrocrystal-monohydrate, (MACROBID) 100 MG capsule Take 1 capsule (100 mg total) by mouth 2 (two) times daily. X 7 days 02/05/15   April Palumbo, MD  phenazopyridine (PYRIDIUM) 200 MG tablet Take 1 tablet (200 mg total) by mouth 3  (three) times daily. 02/05/15   April Palumbo, MD   BP 135/85 mmHg  Pulse 78  Temp(Src) 99.8 F (37.7 C) (Oral)  Resp 18  Ht 5\' 8"  (1.727 m)  Wt 158 lb (71.668 kg)  BMI 24.03 kg/m2  SpO2 100%  LMP 02/24/2015 Physical Exam  Constitutional: She is oriented to person, place, and time. She appears well-developed and well-nourished. No distress.  HENT:  Head: Normocephalic and atraumatic.  Mouth/Throat: Oropharynx is clear and moist. Normal dentition.  BL ear canals inflamed and moist, L>R. BL TM normal. No mastoid tenderness.  Eyes: Conjunctivae and EOM are normal.  Neck: Normal range of motion. Neck supple.  Cardiovascular: Normal rate, regular rhythm and normal heart sounds.   Pulmonary/Chest: Effort normal and breath sounds normal. No respiratory distress.  Musculoskeletal: Normal range of motion. She exhibits no edema.  Neurological: She is alert and oriented to person, place, and time. No sensory deficit.  Skin: Skin is warm and dry.  Psychiatric: She has a normal mood and affect. Her behavior is normal.  Nursing note and vitals reviewed.   ED Course  Procedures (including critical care time) Labs Review Labs Reviewed - No data to display  Imaging Review No results found.   EKG Interpretation None      MDM   Final diagnoses:  Otitis externa, bilateral   NAD. Rx cortisporin. Advised against Q-tip use. Stable for d/c. Return precautions given. Patient states understanding  of treatment care plan and is agreeable.  Kathrynn Speed, PA-C 03/08/15 2248  Elwin Mocha, MD 03/08/15 [phone]

## 2015-06-29 ENCOUNTER — Emergency Department (HOSPITAL_BASED_OUTPATIENT_CLINIC_OR_DEPARTMENT_OTHER)
Admission: EM | Admit: 2015-06-29 | Discharge: 2015-06-29 | Disposition: A | Discharge status: Home / Self Care | Payer: Medicaid Other | Attending: Emergency Medicine | Admitting: Emergency Medicine

## 2015-06-29 ENCOUNTER — Encounter (HOSPITAL_BASED_OUTPATIENT_CLINIC_OR_DEPARTMENT_OTHER): Payer: Self-pay | Admitting: *Deleted

## 2015-06-29 DIAGNOSIS — H6091 Unspecified otitis externa, right ear: Secondary | ICD-10-CM | POA: Diagnosis not present

## 2015-06-29 DIAGNOSIS — J45909 Unspecified asthma, uncomplicated: Secondary | ICD-10-CM | POA: Insufficient documentation

## 2015-06-29 DIAGNOSIS — Z720 Tobacco use: Secondary | ICD-10-CM | POA: Insufficient documentation

## 2015-06-29 DIAGNOSIS — H9201 Otalgia, right ear: Secondary | ICD-10-CM | POA: Diagnosis present

## 2015-06-29 MED ORDER — IBUPROFEN 800 MG PO TABS: 800.0000 mg | ORAL_TABLET | Freq: Three times a day (TID) | ORAL | Status: DC

## 2015-06-29 NOTE — ED Provider Notes (Signed)
CSN: [id number]     Arrival date & time 06/29/15  0856 History   First MD Initiated Contact with Patient 06/29/15 [phone]     Chief Complaint  Patient presents with  . Otalgia     (Consider location/radiation/quality/duration/timing/severity/associated sxs/prior Treatment) HPI Comments: 24 year old female complaining of continued right ear pain 1 week. She was seen at an emergency department in [facility] yesterday and prescribed Polytrim eardrops and Vicodin and states she used the eardrops 4 times yesterday and there has been no change. The Vicodin is not helping her pain and she has severe, 10/10 pain. Reports the drainage "that I can feel" but has not seen. No fevers. Denies any other symptoms. She is wondering if there is anything stronger for pain that she can take.  Patient is a 24 y.o. female presenting with ear pain. The history is provided by the patient.  Otalgia Location:  Right Behind ear:  No abnormality Quality:  Throbbing, sore, sharp and aching Severity:  Severe Onset quality:  Gradual Duration:  1 week Timing:  Constant Progression:  Worsening Chronicity:  Recurrent Relieved by:  Nothing Worsened by:  Palpation Ineffective treatments: vicodin, polytrim.   Past Medical History  Diagnosis Date  . Acute URI   . Asthma    Past Surgical History  Procedure Laterality Date  . Wisdom tooth extraction     History reviewed. No pertinent family history. Social History  Substance Use Topics  . Smoking status: Current Every Day Smoker -- 1.00 packs/day    Last Attempt to Quit: 11/09/2014  . Smokeless tobacco: Never Used  . Alcohol Use: Yes     Comment: 1 beer/month   OB History    Gravida Para Term Preterm AB TAB SAB Ectopic Multiple Living   Review of Systems  HENT: Positive for ear pain.   All other systems reviewed and are negative.     Allergies  Review of patient's allergies indicates no known allergies.  Home Medications    Prior to Admission medications   Medication Sig Start Date End Date Taking? Authorizing Provider  ibuprofen (ADVIL,MOTRIN) 800 MG tablet Take 1 tablet (800 mg total) by mouth 3 (three) times daily. 06/29/15   Robyn M Hess, PA-C   BP 146/97 mmHg  Pulse 80  Temp(Src) 98.8 F (37.1 C) (Oral)  Resp 16  Ht  (1.727 m)  Wt 152 lb (68.947 kg)  BMI 23.12 kg/m2  SpO2 100%  LMP 06/06/2015 Physical Exam  Constitutional: She is oriented to person, place, and time. She appears well-developed and well-nourished. No distress.  HENT:  Head: Normocephalic and atraumatic.  Right Ear: Tympanic membrane normal.  Left Ear: Tympanic membrane and ear canal normal.  Mouth/Throat: Oropharynx is clear and moist.  R ear canal erythematous, moist and inflamed. No mastoid tenderness. Tenderness over pinna. No erythema of pinna.  Eyes: Conjunctivae and EOM are normal.  Neck: Normal range of motion. Neck supple.  Cardiovascular: Normal rate, regular rhythm and normal heart sounds.   Pulmonary/Chest: Effort normal and breath sounds normal. No respiratory distress.  Musculoskeletal: Normal range of motion. She exhibits no edema.  Neurological: She is alert and oriented to person, place, and time. No sensory deficit.  Skin: Skin is warm and dry.  Psychiatric: She has a normal mood and affect. Her behavior is normal.  Nursing note and vitals reviewed.   ED Course  Procedures (including critical care time) Labs Review  Labs Reviewed - No data to display  Imaging Review No results found. I have personally reviewed and evaluated these images and lab results as part of my medical decision-making.   EKG Interpretation None      MDM   Final diagnoses:  Right otitis externa   Non-toxic appearing, NAD. AFVSS. Discussed with the patient that the ear infection will not go away after one days worth of the eardrops. I advised her to add on ibuprofen, complete the course of the eardrops and follow up with  her PCP. No mastoid tenderness concerning for mastoiditis. TM normal. Stable for discharge. Return precautions given. Patient states understanding of treatment care plan and is agreeable.  Kathrynn Speed, PA-C 06/29/15 [id number]  Benjiman Core, MD 06/30/15 [phone]

## 2015-06-29 NOTE — Discharge Instructions (Signed)
Take ibuprofen as prescribed. Continue taking the ear drops as prescribed by the other emergency department.  Otitis Externa Otitis externa is a bacterial or fungal infection of the outer ear canal. This is the area from the eardrum to the outside of the ear. Otitis externa is sometimes called "swimmer's ear." CAUSES  Possible causes of infection include:  Swimming in dirty water.  Moisture remaining in the ear after swimming or bathing.  Mild injury (trauma) to the ear.  Objects stuck in the ear (foreign body).  Cuts or scrapes (abrasions) on the outside of the ear. SIGNS AND SYMPTOMS  The first symptom of infection is often itching in the ear canal. Later signs and symptoms may include swelling and redness of the ear canal, ear pain, and yellowish-white fluid (pus) coming from the ear. The ear pain may be worse when pulling on the earlobe. DIAGNOSIS  Your health care provider will perform a physical exam. A sample of fluid may be taken from the ear and examined for bacteria or fungi. TREATMENT  Antibiotic ear drops are often given for 10 to 14 days. Treatment may also include pain medicine or corticosteroids to reduce itching and swelling. HOME CARE INSTRUCTIONS   Apply antibiotic ear drops to the ear canal as prescribed by your health care provider.  Take medicines only as directed by your health care provider.  If you have diabetes, follow any additional treatment instructions from your health care provider.  Keep all follow-up visits as directed by your health care provider. PREVENTION   Keep your ear dry. Use the corner of a towel to absorb water out of the ear canal after swimming or bathing.  Avoid scratching or putting objects inside your ear. This can damage the ear canal or remove the protective wax that lines the canal. This makes it easier for bacteria and fungi to grow.  Avoid swimming in lakes, polluted water, or poorly chlorinated pools.  You may use ear drops  made of rubbing alcohol and vinegar after swimming. Combine equal parts of white vinegar and alcohol in a bottle. Put 3 or 4 drops into each ear after swimming. SEEK MEDICAL CARE IF:   You have a fever.  Your ear is still red, swollen, painful, or draining pus after 3 days.  Your redness, swelling, or pain gets worse.  You have a severe headache.  You have redness, swelling, pain, or tenderness in the area behind your ear. MAKE SURE YOU:   Understand these instructions.  Will watch your condition.  Will get help right away if you are not doing well or get worse. Document Released: 09/26/2005 Document Revised: 02/10/2014 Document Reviewed: 10/13/2011 [facility] Patient Information 2015 [place], [place]. This information is not intended to replace advice given to you by your health care provider. Make sure you discuss any questions you have with your health care provider.

## 2015-06-29 NOTE — ED Notes (Signed)
Pt amb to room 5 with quick steady gait, teary, reports right ear pain x several days, pt states she was seen at another ER yesterday and rx drops and vicodin, neither of which are helping. Pt denies cough, congestion, or any other c/o.

## 2016-03-02 ENCOUNTER — Emergency Department (HOSPITAL_BASED_OUTPATIENT_CLINIC_OR_DEPARTMENT_OTHER)
Admission: EM | Admit: 2016-03-02 | Discharge: 2016-03-02 | Disposition: A | Discharge status: Home / Self Care | Payer: Medicaid Other | Attending: Emergency Medicine | Admitting: Emergency Medicine

## 2016-03-02 ENCOUNTER — Encounter (HOSPITAL_BASED_OUTPATIENT_CLINIC_OR_DEPARTMENT_OTHER): Payer: Self-pay

## 2016-03-02 DIAGNOSIS — B349 Viral infection, unspecified: Secondary | ICD-10-CM | POA: Diagnosis not present

## 2016-03-02 DIAGNOSIS — J45909 Unspecified asthma, uncomplicated: Secondary | ICD-10-CM | POA: Diagnosis not present

## 2016-03-02 DIAGNOSIS — F17200 Nicotine dependence, unspecified, uncomplicated: Secondary | ICD-10-CM | POA: Diagnosis not present

## 2016-03-02 DIAGNOSIS — M545 Low back pain: Secondary | ICD-10-CM | POA: Diagnosis present

## 2016-03-02 LAB — URINALYSIS, ROUTINE W REFLEX MICROSCOPIC
BILIRUBIN URINE: NEGATIVE
Glucose, UA: NEGATIVE mg/dL
HGB URINE DIPSTICK: NEGATIVE
KETONES UR: NEGATIVE mg/dL
Leukocytes, UA: NEGATIVE
NITRITE: NEGATIVE
PROTEIN: NEGATIVE mg/dL
Specific Gravity, Urine: 1.017 (ref 1.005–1.030)
pH: 7.5 (ref 5.0–8.0)

## 2016-03-02 LAB — PREGNANCY, URINE: PREG TEST UR: NEGATIVE

## 2016-03-02 MED ORDER — NAPROXEN 250 MG PO TABS
500.0000 mg | ORAL_TABLET | Freq: Once | ORAL | Status: AC
  Administered 2016-03-02: 500 mg via ORAL
  Filled 2016-03-02: qty 2

## 2016-03-02 MED ORDER — ACETAMINOPHEN 500 MG PO TABS
1000.0000 mg | ORAL_TABLET | Freq: Once | ORAL | Status: AC
  Administered 2016-03-02: 1000 mg via ORAL
  Filled 2016-03-02: qty 2

## 2016-03-02 MED ORDER — NAPROXEN 375 MG PO TABS: 375.0000 mg | ORAL_TABLET | Freq: Two times a day (BID) | ORAL | Status: DC

## 2016-03-02 NOTE — ED Provider Notes (Signed)
CSN: [id number]     Arrival date & time 03/02/16  0319 History   First MD Initiated Contact with Patient 03/02/16 [phone]     Chief Complaint  Patient presents with  . Back Pain     (Consider location/radiation/quality/duration/timing/severity/associated sxs/prior Treatment) Patient is a 25 y.o. female presenting with musculoskeletal pain. The history is provided by the patient.  Muscle Pain This is a new problem. The current episode started 6 to 12 hours ago. The problem occurs constantly. The problem has not changed since onset.Pertinent negatives include no chest pain, no abdominal pain, no headaches and no shortness of breath. Nothing aggravates the symptoms. Nothing relieves the symptoms. She has tried nothing for the symptoms. The treatment provided no relief.  Has some low back pain and stated her urine was cloudy.  Feels chilled.    Past Medical History  Diagnosis Date  . Acute URI   . Asthma    Past Surgical History  Procedure Laterality Date  . Wisdom tooth extraction     History reviewed. No pertinent family history. Social History  Substance Use Topics  . Smoking status: Current Every Day Smoker -- 1.00 packs/day    Last Attempt to Quit: 11/09/2014  . Smokeless tobacco: Never Used  . Alcohol Use: Yes     Comment: 1 beer/month   OB History    Gravida Para Term Preterm AB TAB SAB Ectopic Multiple Living   2 1 1       1      Review of Systems  Constitutional: Negative for diaphoresis and fatigue.  HENT: Negative for congestion, drooling, ear discharge, ear pain, sore throat, trouble swallowing and voice change.   Eyes: Negative for photophobia.  Respiratory: Negative for cough, shortness of breath and wheezing.   Cardiovascular: Negative for chest pain.  Gastrointestinal: Negative for vomiting, abdominal pain, diarrhea and constipation.  Genitourinary: Negative for vaginal bleeding, vaginal discharge and pelvic pain.  Musculoskeletal: Positive for myalgias. Negative  for neck pain and neck stiffness.  Neurological: Negative for headaches.  Hematological: Negative for adenopathy.  All other systems reviewed and are negative.     Allergies  Review of patient's allergies indicates no known allergies.  Home Medications   Prior to Admission medications   Medication Sig Start Date End Date Taking? Authorizing Provider  ibuprofen (ADVIL,MOTRIN) 800 MG tablet Take 1 tablet (800 mg total) by mouth 3 (three) times daily. 06/29/15   Robyn M Hess, PA-C   BP 127/85 mmHg  Pulse 99  Temp(Src) 99.7 F (37.6 C) (Oral)  Resp 14  Ht 5\' 7"  (1.702 m)  Wt 157 lb (71.215 kg)  BMI 24.58 kg/m2  SpO2 100%  LMP 01/20/2016 Physical Exam  Constitutional: She is oriented to person, place, and time. She appears well-developed and well-nourished. No distress.  HENT:  Head: Normocephalic and atraumatic.  Mouth/Throat: Oropharynx is clear and moist. No oropharyngeal exudate.  Eyes: Conjunctivae and EOM are normal. Pupils are equal, round, and reactive to light.  Neck: Normal range of motion. Neck supple. No spinous process tenderness and no muscular tenderness present. No rigidity. No Brudzinski's sign and no Kernig's sign noted.  Cardiovascular: Normal rate, regular rhythm and intact distal pulses.   Pulmonary/Chest: Effort normal and breath sounds normal. No stridor. No respiratory distress. She has no wheezes. She has no rales.  Abdominal: Soft. Bowel sounds are normal. There is no tenderness. There is no rebound and no guarding.  Musculoskeletal: Normal range of motion.  Cervical back: Normal.       Thoracic back: Normal.       Lumbar back: Normal.  Lymphadenopathy:    She has no cervical adenopathy.  Neurological: She is alert and oriented to person, place, and time. She has normal reflexes.  Skin: Skin is warm and dry. No rash noted. She is not diaphoretic.  Psychiatric: She has a normal mood and affect.    ED Course  Procedures (including critical care  time) Labs Review Labs Reviewed  URINALYSIS, ROUTINE W REFLEX MICROSCOPIC (NOT AT [facility])  PREGNANCY, URINE    ImagingFaxton[facility]St[facility] Luke'[facility] CampusReview No results found. I have personally reviewed and evaluated these images and lab results as part of my medical decision-making.   EKG Interpretation None      MDM   Final diagnoses:  None   Filed Vitals:   03/02/16 0327 03/02/16 0328  BP: 127/85   Pulse: 93 99  Temp: 99.7 F (37.6 C)   Resp: 16 14   Results for orders placed or performed during the hospital encounter of 03/02/16  Urinalysis, Routine w reflex microscopic (not at [facility])  Result Value Ref Range   Color, Urine YELLOW YELLOW   APPearance CLEAR CLEAR   Specific Gravity, Urine 1.017 1.005 - 1.030   pH 7.5 5.0 - 8.0   Glucose, UA NEGATIVE NEGATIVE mg/dL   Hgb urine dipstick NEGATIVE NEGATIVE   Bilirubin Urine NEGATIVE NEGATIVE   Ketones, ur NEGATIVE NEGATIVE mg/dL   Protein, ur NEGATIVE NEGATIVE mg/dL   Nitrite NEGATIVE NEGATIVE   Leukocytes, UA NEGATIVE NEGATIVE  Pregnancy, urine  Result Value Ref Range   Preg Test, Ur NEGATIVE NEGATIVE   No results found.  Suspect viral illness.  Lung exam is clear and patient denies cough.  Denies other symptoms.  Follow up with your PMD for recheck strict return precautions    Rever Pichette, MD 03/02/16 [phone]

## 2016-03-02 NOTE — ED Notes (Signed)
Crackers and ginger ale given per pt request

## 2016-03-02 NOTE — ED Notes (Signed)
Pt c/o lower back pain with dark cloudy urine; night sweats off and on with low grade temp

## 2016-04-14 ENCOUNTER — Telehealth: Payer: Self-pay

## 2016-04-14 NOTE — Telephone Encounter (Signed)
NEEDED REFERRAL FOR APPT

## 2016-04-19 ENCOUNTER — Encounter (HOSPITAL_COMMUNITY): Payer: Self-pay

## 2016-04-19 ENCOUNTER — Inpatient Hospital Stay (HOSPITAL_COMMUNITY)
Admission: AD | Admit: 2016-04-19 | Discharge: 2016-04-19 | Disposition: A | Discharge status: Home / Self Care | Payer: Medicaid Other | Source: Ambulatory Visit | Attending: Obstetrics & Gynecology | Admitting: Obstetrics & Gynecology

## 2016-04-19 ENCOUNTER — Inpatient Hospital Stay (HOSPITAL_COMMUNITY): Payer: Medicaid Other

## 2016-04-19 DIAGNOSIS — O23591 Infection of other part of genital tract in pregnancy, first trimester: Secondary | ICD-10-CM | POA: Diagnosis not present

## 2016-04-19 DIAGNOSIS — O26891 Other specified pregnancy related conditions, first trimester: Secondary | ICD-10-CM | POA: Insufficient documentation

## 2016-04-19 DIAGNOSIS — Z87891 Personal history of nicotine dependence: Secondary | ICD-10-CM | POA: Insufficient documentation

## 2016-04-19 DIAGNOSIS — O99511 Diseases of the respiratory system complicating pregnancy, first trimester: Secondary | ICD-10-CM | POA: Diagnosis not present

## 2016-04-19 DIAGNOSIS — B3731 Acute candidiasis of vulva and vagina: Secondary | ICD-10-CM

## 2016-04-19 DIAGNOSIS — R109 Unspecified abdominal pain: Secondary | ICD-10-CM | POA: Diagnosis not present

## 2016-04-19 DIAGNOSIS — O9989 Other specified diseases and conditions complicating pregnancy, childbirth and the puerperium: Secondary | ICD-10-CM | POA: Diagnosis not present

## 2016-04-19 DIAGNOSIS — B9689 Other specified bacterial agents as the cause of diseases classified elsewhere: Secondary | ICD-10-CM | POA: Diagnosis not present

## 2016-04-19 DIAGNOSIS — Z3A01 Less than 8 weeks gestation of pregnancy: Secondary | ICD-10-CM | POA: Diagnosis not present

## 2016-04-19 DIAGNOSIS — O26899 Other specified pregnancy related conditions, unspecified trimester: Secondary | ICD-10-CM

## 2016-04-19 DIAGNOSIS — B373 Candidiasis of vulva and vagina: Secondary | ICD-10-CM

## 2016-04-19 DIAGNOSIS — O209 Hemorrhage in early pregnancy, unspecified: Secondary | ICD-10-CM

## 2016-04-19 DIAGNOSIS — R1032 Left lower quadrant pain: Secondary | ICD-10-CM | POA: Diagnosis present

## 2016-04-19 DIAGNOSIS — J45909 Unspecified asthma, uncomplicated: Secondary | ICD-10-CM | POA: Insufficient documentation

## 2016-04-19 LAB — URINALYSIS, ROUTINE W REFLEX MICROSCOPIC
Bilirubin Urine: NEGATIVE
GLUCOSE, UA: NEGATIVE mg/dL
Hgb urine dipstick: NEGATIVE
Ketones, ur: NEGATIVE mg/dL
Nitrite: NEGATIVE
PH: 6.5 (ref 5.0–8.0)
PROTEIN: NEGATIVE mg/dL
Specific Gravity, Urine: 1.02 (ref 1.005–1.030)

## 2016-04-19 LAB — CBC WITH DIFFERENTIAL/PLATELET
Basophils Absolute: 0 10*3/uL (ref 0.0–0.1)
Basophils Relative: 0 %
Eosinophils Absolute: 0.3 10*3/uL (ref 0.0–0.7)
Eosinophils Relative: 4 %
HCT: 35.5 % — ABNORMAL LOW (ref 36.0–46.0)
HEMOGLOBIN: 12.5 g/dL (ref 12.0–15.0)
LYMPHS ABS: 2.4 10*3/uL (ref 0.7–4.0)
LYMPHS PCT: 29 %
MCH: 31.1 pg (ref 26.0–34.0)
MCHC: 35.2 g/dL (ref 30.0–36.0)
MCV: 88.3 fL (ref 78.0–100.0)
MONOS PCT: 5 %
Monocytes Absolute: 0.4 10*3/uL (ref 0.1–1.0)
NEUTROS PCT: 62 %
Neutro Abs: 5.1 10*3/uL (ref 1.7–7.7)
Platelets: 225 10*3/uL (ref 150–400)
RBC: 4.02 MIL/uL (ref 3.87–5.11)
RDW: 12.8 % (ref 11.5–15.5)
WBC: 8.2 10*3/uL (ref 4.0–10.5)

## 2016-04-19 LAB — URINE MICROSCOPIC-ADD ON
BACTERIA UA: NONE SEEN
RBC / HPF: NONE SEEN RBC/hpf (ref 0–5)

## 2016-04-19 LAB — WET PREP, GENITAL
Clue Cells Wet Prep HPF POC: NONE SEEN
Sperm: NONE SEEN
Trich, Wet Prep: NONE SEEN

## 2016-04-19 LAB — HCG, QUANTITATIVE, PREGNANCY: hCG, Beta Chain, Quant, S: 111737 m[IU]/mL — ABNORMAL HIGH (ref <5)

## 2016-04-19 NOTE — Discharge Instructions (Signed)
Take tylenol as needed for pain. Start your prenatal care. Return for worsening symptoms.

## 2016-04-19 NOTE — MAU Note (Signed)
Been having pain in LLQ, comes and goes, sharp stabbing pain. Pinkish spotting yesterday, none today.  Thinks might have a yeast infection,started using cream

## 2016-04-19 NOTE — MAU Provider Note (Signed)
CSN: [id number]     Arrival date & time 04/19/16  1331 History   None    Chief Complaint  Patient presents with  . Abdominal Pain  . Vaginal Bleeding     (Consider location/radiation/quality/duration/timing/severity/associated sxs/prior Treatment) Abdominal Pain This is a new problem. The current episode started today. The onset quality is sudden. The problem occurs intermittently. The pain is located in the LLQ. The pain is at a severity of 10/10. The quality of the pain is sharp. The abdominal pain does not radiate. Associated symptoms include dysuria, frequency and nausea. The pain is relieved by nothing. She has tried nothing for the symptoms.   Amy Little is a 25 y.o. [phone] @ approximately 7 weeks gest who presents to the MAU with spotting that started yesterday and abdominal pain that started at 7 am. Patient reports that she went to her PCP 6/27 and had a Bhcg that was over 200 and they told her she was about 4-[redacted] weeks pregnant. She did not have an ultrasound at that visit.   Past Medical History  Diagnosis Date  . Acute URI   . Asthma    Past Surgical History  Procedure Laterality Date  . Wisdom tooth extraction     History reviewed. No pertinent family history. Social History  Substance Use Topics  . Smoking status: Former Smoker -- 1.00 packs/day    Quit date: 10/19/2014  . Smokeless tobacco: Never Used  . Alcohol Use: Yes     Comment: 1 beer/month   OB History    Gravida Para Term Preterm AB TAB SAB Ectopic Multiple Living   Review of Systems  Gastrointestinal: Positive for nausea and abdominal pain.  Genitourinary: Positive for dysuria, frequency and vaginal bleeding.  all other systems negative    Allergies  Review of patient's allergies indicates no known allergies.  Home Medications   Prior to Admission medications   Medication Sig Start Date End Date Taking? Authorizing Provider  albuterol (PROVENTIL HFA;VENTOLIN HFA) 108  (90 Base) MCG/ACT inhaler Inhale 2 puffs into the lungs every 6 (six) hours as needed for wheezing or shortness of breath.   Yes Historical Provider, MD  Miconazole Nitrate (MONISTAT 3 VA) Place 1 application vaginally at bedtime.   Yes Historical Provider, MD  Prenatal Vit-Fe Fumarate-FA (PRENATAL MULTIVITAMIN) TABS tablet Take 1 tablet by mouth daily at 12 noon.   Yes Historical Provider, MD  ibuprofen (ADVIL,MOTRIN) 800 MG tablet Take 1 tablet (800 mg total) by mouth 3 (three) times daily. Patient not taking: Reported on 04/19/2016 06/29/15   Kathrynn Speed, PA-C  naproxen (NAPROSYN) 375 MG tablet Take 1 tablet (375 mg total) by mouth 2 (two) times daily. Patient not taking: Reported on 04/19/2016 03/02/16   April Palumbo, MD   BP 114/78 mmHg  Pulse 80  Temp(Src) 98.4 F (36.9 C) (Oral)  Resp 16  Ht  (1.702 m)  Wt 158 lb 9.6 oz (71.94 kg)  BMI 24.83 kg/m2  LMP 03/05/2016 Physical Exam  Constitutional: She is oriented to person, place, and time. She appears well-developed and well-nourished. No distress.  HENT:  Head: Normocephalic.  Eyes: EOM are normal.  Neck: Neck supple.  Cardiovascular: Normal rate and regular rhythm.   Pulmonary/Chest: Effort normal and breath sounds normal.  Abdominal: Soft. Bowel sounds are normal. There is tenderness in the left lower quadrant. There is no CVA tenderness.  Genitourinary:  External genitalia  without lesions, thick white d/c vaginal vault. Cervix long, closed, no CMT, no adnexal tenderness, uterus slightly enlarged.   Musculoskeletal: Normal range of motion.  Neurological: She is alert and oriented to person, place, and time. No cranial nerve deficit.  Skin: Skin is warm and dry.  Psychiatric: She has a normal mood and affect. Her behavior is normal.  Nursing note and vitals reviewed.   ED Course  Procedures (including critical care time) Labs Review Labs Reviewed  WET PREP, GENITAL - Abnormal; Notable for the following:    Yeast Wet  Prep HPF POC PRESENT (*)    WBC, Wet Prep HPF POC MANY (*)    All other components within normal limits  CBC WITH DIFFERENTIAL/PLATELET - Abnormal; Notable for the following:    HCT 35.5 (*)    All other components within normal limits  HCG, QUANTITATIVE, PREGNANCY - Abnormal; Notable for the following:    hCG, Beta Chain, Amy LongestQuant, S [id number] (*)    All other components within normal limits  URINALYSIS, ROUTINE W REFLEX MICROSCOPIC (NOT AT [facility]) - Abnormal; Notable for the following:    Leukocytes, UA TRACE (*)    All other components within normal limits  URINE MICROSCOPIC-ADD ON - Abnormal; Notable for the following:    Squamous Epithelial / LPF 0-5 (*)    All other components within normal limits  RPR  HIV ANTIBODY (ROUTINE TESTING)  GC/CHLAMYDIA PROBE AMP () NOT [facility]    Imaging Review [place] Ob Comp Less 14 Wks  04/19/2016  CLINICAL DATA:  Intermittent left lower quadrant pain since 7 a.m. EXAM: OBSTETRIC <14 WK ULTRASOUND TECHNIQUE: Transabdominal ultrasound was performed for evaluation of the gestation as well as the maternal uterus and adnexal regions. COMPARISON:  None. FINDINGS: Intrauterine gestational sac: Single Yolk sac:  Present Embryo:  Present Cardiac Activity: Present Heart Rate: 126 bpm CRL:   10  mm   7 w 1d                  US EDC: 12/05/2016 Subchorionic hemorrhage:  None visualized. Maternal uterus/adnexae: No adnexal mass.  No pelvic free fluid. IMPRESSION: Single live intrauterine pregnancy as described above. Electronically Signed   By: Elige KoHetal  Patel   On: 04/19/2016 16:10   I have personally reviewed and evaluated these images and lab results as part of my medical decision-making.  MDM  25 y.o. female with abdominal pain and spotting in early pregnancy stable for d/c without viable IUP @ 7 weeks 1 day. She will start her prenatal care and take tylenol as needed for discomfort. Discussed with the patient and all questioned fully answered. She will return if  any problems arise.   Final diagnoses:  Abdominal pain in pregnancy  Monilial vaginitis

## 2016-04-20 LAB — RPR: RPR Ser Ql: NONREACTIVE

## 2016-04-20 LAB — GC/CHLAMYDIA PROBE AMP (~~LOC~~) NOT AT ARMC
Chlamydia: NEGATIVE
Neisseria Gonorrhea: NEGATIVE

## 2016-04-20 LAB — HIV ANTIBODY (ROUTINE TESTING W REFLEX): HIV Screen 4th Generation wRfx: NONREACTIVE

## 2016-04-27 ENCOUNTER — Ambulatory Visit (INDEPENDENT_AMBULATORY_CARE_PROVIDER_SITE_OTHER): Payer: Medicaid Other | Admitting: Obstetrics & Gynecology

## 2016-04-27 ENCOUNTER — Encounter: Payer: Self-pay | Admitting: Obstetrics & Gynecology

## 2016-04-27 VITALS — BP 118/77 | HR 101 | Temp 98.7°F | Wt 162.7 lb

## 2016-04-27 DIAGNOSIS — Z3491 Encounter for supervision of normal pregnancy, unspecified, first trimester: Secondary | ICD-10-CM | POA: Diagnosis not present

## 2016-04-27 DIAGNOSIS — Z3490 Encounter for supervision of normal pregnancy, unspecified, unspecified trimester: Secondary | ICD-10-CM | POA: Insufficient documentation

## 2016-04-27 LAB — POCT URINALYSIS DIPSTICK
BILIRUBIN UA: NEGATIVE
Blood, UA: NEGATIVE
Glucose, UA: NEGATIVE
KETONES UA: NEGATIVE
Nitrite, UA: NEGATIVE
Protein, UA: NEGATIVE
Spec Grav, UA: 1.02
Urobilinogen, UA: 0.2
pH, UA: 5

## 2016-04-27 NOTE — Progress Notes (Signed)
   Subjective:    Amy Little is a 25 y.o.G3P1011 at 4645w5d, by LMP c/w 7 week scan, being seen today for her first obstetrical visit.  Her obstetrical history is significant for term SVD, no complications in previous pregnancy. Patient does intend to breast feed. Pregnancy history fully reviewed.  Patient reports occasional lower abdominal pain. Was seen in MAU last week, had normal ultrasounf that showed 7 week viable SIUP. Also has some spotting, no SCH seen on scan.  Filed Vitals:   04/27/16 0906  BP: 118/77  Pulse: 101  Temp: 98.7 F (37.1 C)  Weight: 162 lb 11.2 oz (73.8 kg)    HISTORY: OB History  Gravida Para Term Preterm AB SAB TAB Ectopic Multiple Living  3 1 1  1 1    1     # Outcome Date GA Lbr Len/2nd Weight Sex Delivery Anes PTL Lv  3 Current           2 Term 2012    M Vag-Spont EPI N Y  1 SAB              Past Medical History  Diagnosis Date  . Asthma    Past Surgical History  Procedure Laterality Date  . Wisdom tooth extraction     Family History  Problem Relation Age of Onset  . Thyroid disease Mother   . COPD Father   . Cancer Maternal Grandfather   . COPD Paternal Grandmother      Exam    Uterus:     Pelvic Exam:    Perineum: No Hemorrhoids, Normal Perineum   Vulva: normal   Vagina:  normal mucosa, normal discharge   Cervix: multiparous appearance, no bleeding following Pap, no cervical motion tenderness and no lesions   Adnexa: normal adnexa and no mass, fullness, tenderness   Bony Pelvis: average and gynecoid  System: Breast:  normal appearance, no masses or tenderness, Inspection negative   Skin: normal coloration and turgor, no rashes   Neurologic: oriented, normal, negative, normal mood   Extremities: normal strength, tone, and muscle mass   HEENT PERRLA, extra ocular movement intact and sclera clear, anicteric   Mouth/Teeth mucous membranes moist, pharynx normal without lesions and dental hygiene good   Neck supple and no masses   Cardiovascular: regular rate and rhythm   Respiratory:  appears well, vitals normal, no respiratory distress, acyanotic, normal RR, chest clear, no wheezing, crepitations, rhonchi, normal symmetric air entry   Abdomen: soft, non-tender; bowel sounds normal; no masses,  no organomegaly   Urinary: urethral meatus normal   +FHT on bedside scan   Assessment:    Pregnancy: G3P1011 Patient Active Problem List   Diagnosis Date Noted  . Supervision of normal pregnancy 04/27/2016        Plan:    Initial labs drawn. Continue prenatal vitamins. Problem list reviewed and updated. Genetic Screening discussed Integrated Screen: undecided. Ultrasound discussed; fetal survey: to be ordered later. [facility] with multiple MDs and other Advanced Practice Providers was explained to patient; also emphasized that residents, students are part of our team. Reassured about round ligament pains; normal aches/pains of pregnancy.  Routine obstetric precautions reviewed. Follow up in 4 weeks.      Tereso NewcomerANYANWU,Aisa Schoeppner A, MD 04/27/2016

## 2016-04-27 NOTE — Patient Instructions (Signed)
 First Trimester of Pregnancy The first trimester of pregnancy is from week 1 until the end of week 12 (months 1 through 3). A week after a sperm fertilizes an egg, the egg will implant on the wall of the uterus. This embryo will begin to develop into a baby. Genes from you and your partner are forming the baby. The female genes determine whether the baby is a boy or a girl. At 6-8 weeks, the eyes and face are formed, and the heartbeat can be seen on ultrasound. At the end of 12 weeks, all the baby's organs are formed.  Now that you are pregnant, you will want to do everything you can to have a healthy baby. Two of the most important things are to get good prenatal care and to follow your health care provider's instructions. Prenatal care is all the medical care you receive before the baby's birth. This care will help prevent, find, and treat any problems during the pregnancy and childbirth. BODY CHANGES Your body goes through many changes during pregnancy. The changes vary from woman to woman.   You may gain or lose a couple of pounds at first.  You may feel sick to your stomach (nauseous) and throw up (vomit). If the vomiting is uncontrollable, call your health care provider.  You may tire easily.  You may develop headaches that can be relieved by medicines approved by your health care provider.  You may urinate more often. Painful urination may mean you have a bladder infection.  You may develop heartburn as a result of your pregnancy.  You may develop constipation because certain hormones are causing the muscles that push waste through your intestines to slow down.  You may develop hemorrhoids or swollen, bulging veins (varicose veins).  Your breasts may begin to grow larger and become tender. Your nipples may stick out more, and the tissue that surrounds them (areola) may become darker.  Your gums may bleed and may be sensitive to brushing and flossing.  Dark spots or blotches  (chloasma, mask of pregnancy) may develop on your face. This will likely fade after the baby is born.  Your menstrual periods will stop.  You may have a loss of appetite.  You may develop cravings for certain kinds of food.  You may have changes in your emotions from day to day, such as being excited to be pregnant or being concerned that something may go wrong with the pregnancy and baby.  You may have more vivid and strange dreams.  You may have changes in your hair. These can include thickening of your hair, rapid growth, and changes in texture. Some women also have hair loss during or after pregnancy, or hair that feels dry or thin. Your hair will most likely return to normal after your baby is born. WHAT TO EXPECT AT YOUR PRENATAL VISITS During a routine prenatal visit:  You will be weighed to make sure you and the baby are growing normally.  Your blood pressure will be taken.  Your abdomen will be measured to track your baby's growth.  The fetal heartbeat will be listened to starting around week 10 or 12 of your pregnancy.  Test results from any previous visits will be discussed. Your health care provider may ask you:  How you are feeling.  If you are feeling the baby move.  If you have had any abnormal symptoms, such as leaking fluid, bleeding, severe headaches, or abdominal cramping.  If you are using any tobacco   products, including cigarettes, chewing tobacco, and electronic cigarettes.  If you have any questions. Other tests that may be performed during your first trimester include:  Blood tests to find your blood type and to check for the presence of any previous infections. They will also be used to check for low iron levels (anemia) and Rh antibodies. Later in the pregnancy, blood tests for diabetes will be done along with other tests if problems develop.  Urine tests to check for infections, diabetes, or protein in the urine.  An ultrasound to confirm the  proper growth and development of the baby.  An amniocentesis to check for possible genetic problems.  Fetal screens for spina bifida and Down syndrome.  You may need other tests to make sure you and the baby are doing well.  HIV (human immunodeficiency virus) testing. Routine prenatal testing includes screening for HIV, unless you choose not to have this test. HOME CARE INSTRUCTIONS  Medicines  Follow your health care provider's instructions regarding medicine use. Specific medicines may be either safe or unsafe to take during pregnancy.  Take your prenatal vitamins as directed.  If you develop constipation, try taking a stool softener if your health care provider approves. Diet  Eat regular, well-balanced meals. Choose a variety of foods, such as meat or vegetable-based protein, fish, milk and low-fat dairy products, vegetables, fruits, and whole grain breads and cereals. Your health care provider will help you determine the amount of weight gain that is right for you.  Avoid raw meat and uncooked cheese. These carry germs that can cause birth defects in the baby.  Eating four or five small meals rather than three large meals a day may help relieve nausea and vomiting. If you start to feel nauseous, eating a few soda crackers can be helpful. Drinking liquids between meals instead of during meals also seems to help nausea and vomiting.  If you develop constipation, eat more high-fiber foods, such as fresh vegetables or fruit and whole grains. Drink enough fluids to keep your urine clear or pale yellow. Activity and Exercise  Exercise only as directed by your health care provider. Exercising will help you:  Control your weight.  Stay in shape.  Be prepared for labor and delivery.  Experiencing pain or cramping in the lower abdomen or low back is a good sign that you should stop exercising. Check with your health care provider before continuing normal exercises.  Try to avoid  standing for long periods of time. Move your legs often if you must stand in one place for a long time.  Avoid heavy lifting.  Wear low-heeled shoes, and practice good posture.  You may continue to have sex unless your health care provider directs you otherwise. Relief of Pain or Discomfort  Wear a good support bra for breast tenderness.   Take warm sitz baths to soothe any pain or discomfort caused by hemorrhoids. Use hemorrhoid cream if your health care provider approves.   Rest with your legs elevated if you have leg cramps or low back pain.  If you develop varicose veins in your legs, wear support hose. Elevate your feet for 15 minutes, 3-4 times a day. Limit salt in your diet. Prenatal Care  Schedule your prenatal visits by the twelfth week of pregnancy. They are usually scheduled monthly at first, then more often in the last 2 months before delivery.  Write down your questions. Take them to your prenatal visits.  Keep all your prenatal visits as directed by   your health care provider. Safety  Wear your seat belt at all times when driving.  Make a list of emergency phone numbers, including numbers for family, friends, the hospital, and police and fire departments. General Tips  Ask your health care provider for a referral to a local prenatal education class. Begin classes no later than at the beginning of month 6 of your pregnancy.  Ask for help if you have counseling or nutritional needs during pregnancy. Your health care provider can offer advice or refer you to specialists for help with various needs.  Do not use hot tubs, steam rooms, or saunas.  Do not douche or use tampons or scented sanitary pads.  Do not cross your legs for long periods of time.  Avoid cat litter boxes and soil used by cats. These carry germs that can cause birth defects in the baby and possibly loss of the fetus by miscarriage or stillbirth.  Avoid all smoking, herbs, alcohol, and medicines  not prescribed by your health care provider. Chemicals in these affect the formation and growth of the baby.  Do not use any tobacco products, including cigarettes, chewing tobacco, and electronic cigarettes. If you need help quitting, ask your health care provider. You may receive counseling support and other resources to help you quit.  Schedule a dentist appointment. At home, brush your teeth with a soft toothbrush and be gentle when you floss. SEEK MEDICAL CARE IF:   You have dizziness.  You have mild pelvic cramps, pelvic pressure, or nagging pain in the abdominal area.  You have persistent nausea, vomiting, or diarrhea.  You have a bad smelling vaginal discharge.  You have pain with urination.  You notice increased swelling in your face, hands, legs, or ankles. SEEK IMMEDIATE MEDICAL CARE IF:   You have a fever.  You are leaking fluid from your vagina.  You have spotting or bleeding from your vagina.  You have severe abdominal cramping or pain.  You have rapid weight gain or loss.  You vomit blood or material that looks like coffee grounds.  You are exposed to German measles and have never had them.  You are exposed to fifth disease or chickenpox.  You develop a severe headache.  You have shortness of breath.  You have any kind of trauma, such as from a fall or a car accident.   This information is not intended to replace advice given to you by your health care provider. Make sure you discuss any questions you have with your health care provider.   Document Released: 09/20/2001 Document Revised: 10/17/2014 Document Reviewed: 08/06/2013 Elsevier Interactive Patient Education 2016 [employer]   Breastfeeding Deciding to breastfeed is one of the best choices you can make for you and your baby. A change in hormones during pregnancy causes your breast tissue to grow and increases the number and size of your milk ducts. These hormones also allow proteins, sugars,  and fats from your blood supply to make breast milk in your milk-producing glands. Hormones prevent breast milk from being released before your baby is born as well as prompt milk flow after birth. Once breastfeeding has begun, thoughts of your baby, as well as his or her sucking or crying, can stimulate the release of milk from your milk-producing glands.  BENEFITS OF BREASTFEEDING For Your Baby  Your first milk (colostrum) helps your baby's digestive system function better.  There are antibodies in your milk that help your baby fight off infections.  Your baby has   a lower incidence of asthma, allergies, and sudden infant death syndrome.  The nutrients in breast milk are better for your baby than infant formulas and are designed uniquely for your baby's needs.  Breast milk improves your baby's brain development.  Your baby is less likely to develop other conditions, such as childhood obesity, asthma, or type 2 diabetes mellitus. For You  Breastfeeding helps to create a very special bond between you and your baby.  Breastfeeding is convenient. Breast milk is always available at the correct temperature and costs nothing.  Breastfeeding helps to burn calories and helps you lose the weight gained during pregnancy.  Breastfeeding makes your uterus contract to its prepregnancy size faster and slows bleeding (lochia) after you give birth.   Breastfeeding helps to lower your risk of developing type 2 diabetes mellitus, osteoporosis, and breast or ovarian cancer later in life. SIGNS THAT YOUR BABY IS HUNGRY Early Signs of Hunger  Increased alertness or activity.  Stretching.  Movement of the head from side to side.  Movement of the head and opening of the mouth when the corner of the mouth or cheek is stroked (rooting).  Increased sucking sounds, smacking lips, cooing, sighing, or squeaking.  Hand-to-mouth movements.  Increased sucking of fingers or hands. Late Signs of  Hunger  Fussing.  Intermittent crying. Extreme Signs of Hunger Signs of extreme hunger will require calming and consoling before your baby will be able to breastfeed successfully. Do not wait for the following signs of extreme hunger to occur before you initiate breastfeeding:  Restlessness.  A loud, strong cry.  Screaming. BREASTFEEDING BASICS Breastfeeding Initiation  Find a comfortable place to sit or lie down, with your neck and back well supported.  Place a pillow or rolled up blanket under your baby to bring him or her to the level of your breast (if you are seated). Nursing pillows are specially designed to help support your arms and your baby while you breastfeed.  Make sure that your baby's abdomen is facing your abdomen.  Gently massage your breast. With your fingertips, massage from your chest wall toward your nipple in a circular motion. This encourages milk flow. You may need to continue this action during the feeding if your milk flows slowly.  Support your breast with 4 fingers underneath and your thumb above your nipple. Make sure your fingers are well away from your nipple and your baby's mouth.  Stroke your baby's lips gently with your finger or nipple.  When your baby's mouth is open wide enough, quickly bring your baby to your breast, placing your entire nipple and as much of the colored area around your nipple (areola) as possible into your baby's mouth.  More areola should be visible above your baby's upper lip than below the lower lip.  Your baby's tongue should be between his or her lower gum and your breast.  Ensure that your baby's mouth is correctly positioned around your nipple (latched). Your baby's lips should create a seal on your breast and be turned out (everted).  It is common for your baby to suck about 2-3 minutes in order to start the flow of breast milk. Latching Teaching your baby how to latch on to your breast properly is very important.  An improper latch can cause nipple pain and decreased milk supply for you and poor weight gain in your baby. Also, if your baby is not latched onto your nipple properly, he or she may swallow some air during feeding. This   can make your baby fussy. Burping your baby when you switch breasts during the feeding can help to get rid of the air. However, teaching your baby to latch on properly is still the best way to prevent fussiness from swallowing air while breastfeeding. Signs that your baby has successfully latched on to your nipple:  Silent tugging or silent sucking, without causing you pain.  Swallowing heard between every 3-4 sucks.  Muscle movement above and in front of his or her ears while sucking. Signs that your baby has not successfully latched on to nipple:  Sucking sounds or smacking sounds from your baby while breastfeeding.  Nipple pain. If you think your baby has not latched on correctly, slip your finger into the corner of your baby's mouth to break the suction and place it between your baby's gums. Attempt breastfeeding initiation again. Signs of Successful Breastfeeding Signs from your baby:  A gradual decrease in the number of sucks or complete cessation of sucking.  Falling asleep.  Relaxation of his or her body.  Retention of a small amount of milk in his or her mouth.  Letting go of your breast by himself or herself. Signs from you:  Breasts that have increased in firmness, weight, and size 1-3 hours after feeding.  Breasts that are softer immediately after breastfeeding.  Increased milk volume, as well as a change in milk consistency and color by the fifth day of breastfeeding.  Nipples that are not sore, cracked, or bleeding. Signs That Your Baby is Getting Enough Milk  Wetting at least 3 diapers in a 24-hour period. The urine should be clear and pale yellow by age 5 days.  At least 3 stools in a 24-hour period by age 5 days. The stool should be soft and  yellow.  At least 3 stools in a 24-hour period by age 7 days. The stool should be seedy and yellow.  No loss of weight greater than 10% of birth weight during the first 3 days of age.  Average weight gain of 4-7 ounces (113-198 g) per week after age 4 days.  Consistent daily weight gain by age 5 days, without weight loss after the age of 2 weeks. After a feeding, your baby may spit up a small amount. This is common. BREASTFEEDING FREQUENCY AND DURATION Frequent feeding will help you make more milk and can prevent sore nipples and breast engorgement. Breastfeed when you feel the need to reduce the fullness of your breasts or when your baby shows signs of hunger. This is called "breastfeeding on demand." Avoid introducing a pacifier to your baby while you are working to establish breastfeeding (the first 4-6 weeks after your baby is born). After this time you may choose to use a pacifier. Research has shown that pacifier use during the first year of a baby's life decreases the risk of sudden infant death syndrome (SIDS). Allow your baby to feed on each breast as long as he or she wants. Breastfeed until your baby is finished feeding. When your baby unlatches or falls asleep while feeding from the first breast, offer the second breast. Because newborns are often sleepy in the first few weeks of life, you may need to awaken your baby to get him or her to feed. Breastfeeding times will vary from baby to baby. However, the following rules can serve as a guide to help you ensure that your baby is properly fed:  Newborns (babies 4 weeks of age or younger) may breastfeed every 1-3 hours.    Newborns should not go longer than 3 hours during the day or 5 hours during the night without breastfeeding.  You should breastfeed your baby a minimum of 8 times in a 24-hour period until you begin to introduce solid foods to your baby at around 6 months of age. BREAST MILK PUMPING Pumping and storing breast milk  allows you to ensure that your baby is exclusively fed your breast milk, even at times when you are unable to breastfeed. This is especially important if you are going back to work while you are still breastfeeding or when you are not able to be present during feedings. Your lactation consultant can give you guidelines on how long it is safe to store breast milk. A breast pump is a machine that allows you to pump milk from your breast into a sterile bottle. The pumped breast milk can then be stored in a refrigerator or freezer. Some breast pumps are operated by hand, while others use electricity. Ask your lactation consultant which type will work best for you. Breast pumps can be purchased, but some hospitals and breastfeeding support groups lease breast pumps on a monthly basis. A lactation consultant can teach you how to hand express breast milk, if you prefer not to use a pump. CARING FOR YOUR BREASTS WHILE YOU BREASTFEED Nipples can become dry, cracked, and sore while breastfeeding. The following recommendations can help keep your breasts moisturized and healthy:  Avoid using soap on your nipples.  Wear a supportive bra. Although not required, special nursing bras and tank tops are designed to allow access to your breasts for breastfeeding without taking off your entire bra or top. Avoid wearing underwire-style bras or extremely tight bras.  Air dry your nipples for 3-4minutes after each feeding.  Use only cotton bra pads to absorb leaked breast milk. Leaking of breast milk between feedings is normal.  Use lanolin on your nipples after breastfeeding. Lanolin helps to maintain your skin's normal moisture barrier. If you use pure lanolin, you do not need to wash it off before feeding your baby again. Pure lanolin is not toxic to your baby. You may also hand express a few drops of breast milk and gently massage that milk into your nipples and allow the milk to air dry. In the first few weeks after  giving birth, some women experience extremely full breasts (engorgement). Engorgement can make your breasts feel heavy, warm, and tender to the touch. Engorgement peaks within 3-5 days after you give birth. The following recommendations can help ease engorgement:  Completely empty your breasts while breastfeeding or pumping. You may want to start by applying warm, moist heat (in the shower or with warm water-soaked hand towels) just before feeding or pumping. This increases circulation and helps the milk flow. If your baby does not completely empty your breasts while breastfeeding, pump any extra milk after he or she is finished.  Wear a snug bra (nursing or regular) or tank top for 1-2 days to signal your body to slightly decrease milk production.  Apply ice packs to your breasts, unless this is too uncomfortable for you.  Make sure that your baby is latched on and positioned properly while breastfeeding. If engorgement persists after 48 hours of following these recommendations, contact your health care provider or a lactation consultant. OVERALL HEALTH CARE RECOMMENDATIONS WHILE BREASTFEEDING  Eat healthy foods. Alternate between meals and snacks, eating 3 of each per day. Because what you eat affects your breast milk, some of the foods   may make your baby more irritable than usual. Avoid eating these foods if you are sure that they are negatively affecting your baby.  Drink milk, fruit juice, and water to satisfy your thirst (about 10 glasses a day).  Rest often, relax, and continue to take your prenatal vitamins to prevent fatigue, stress, and anemia.  Continue breast self-awareness checks.  Avoid chewing and smoking tobacco. Chemicals from cigarettes that pass into breast milk and exposure to secondhand smoke may harm your baby.  Avoid alcohol and drug use, including marijuana. Some medicines that may be harmful to your baby can pass through breast milk. It is important to ask your health  care provider before taking any medicine, including all over-the-counter and prescription medicine as well as vitamin and herbal supplements. It is possible to become pregnant while breastfeeding. If birth control is desired, ask your health care provider about options that will be safe for your baby. SEEK MEDICAL CARE IF:  You feel like you want to stop breastfeeding or have become frustrated with breastfeeding.  You have painful breasts or nipples.  Your nipples are cracked or bleeding.  Your breasts are red, tender, or warm.  You have a swollen area on either breast.  You have a fever or chills.  You have nausea or vomiting.  You have drainage other than breast milk from your nipples.  Your breasts do not become full before feedings by the fifth day after you give birth.  You feel sad and depressed.  Your baby is too sleepy to eat well.  Your baby is having trouble sleeping.   Your baby is wetting less than 3 diapers in a 24-hour period.  Your baby has less than 3 stools in a 24-hour period.  Your baby's skin or the white part of his or her eyes becomes yellow.   Your baby is not gaining weight by 5 days of age. SEEK IMMEDIATE MEDICAL CARE IF:  Your baby is overly tired (lethargic) and does not want to wake up and feed.  Your baby develops an unexplained fever.   This information is not intended to replace advice given to you by your health care provider. Make sure you discuss any questions you have with your health care provider.   Document Released: 09/26/2005 Document Revised: 06/17/2015 Document Reviewed: 03/20/2013 Elsevier Interactive Patient Education 2016 [employer]  

## 2016-04-27 NOTE — Progress Notes (Signed)
Pt c/o sharp stabbing pain lower abdomen, intermittent x 2 weeks.

## 2016-04-29 LAB — URINE CULTURE, OB REFLEX

## 2016-04-29 LAB — CULTURE, OB URINE

## 2016-05-05 LAB — PRENATAL PROFILE I(LABCORP)
Antibody Screen: NEGATIVE
BASOS ABS: 0 10*3/uL (ref 0.0–0.2)
Basos: 0 %
EOS (ABSOLUTE): 0.3 10*3/uL (ref 0.0–0.4)
EOS: 4 %
Hematocrit: 36.3 % (ref 34.0–46.6)
Hemoglobin: 12.1 g/dL (ref 11.1–15.9)
Hepatitis B Surface Ag: NEGATIVE
Immature Grans (Abs): 0 10*3/uL (ref 0.0–0.1)
Immature Granulocytes: 0 %
LYMPHS ABS: 1.5 10*3/uL (ref 0.7–3.1)
Lymphs: 21 %
MCH: 30.7 pg (ref 26.6–33.0)
MCHC: 33.3 g/dL (ref 31.5–35.7)
MCV: 92 fL (ref 79–97)
Monocytes Absolute: 0.4 10*3/uL (ref 0.1–0.9)
Monocytes: 5 %
NEUTROS ABS: 5.1 10*3/uL (ref 1.4–7.0)
Neutrophils: 70 %
PLATELETS: 226 10*3/uL (ref 150–379)
RBC: 3.94 x10E6/uL (ref 3.77–5.28)
RDW: 13.5 % (ref 12.3–15.4)
RH TYPE: POSITIVE
RPR Ser Ql: NONREACTIVE
Rubella Antibodies, IGG: 3.22 index (ref >0.99)
WBC: 7.4 10*3/uL (ref 3.4–10.8)

## 2016-05-05 LAB — TOXASSURE SELECT 13 (MW), URINE: PDF: 0

## 2016-05-05 LAB — HPV DNA PROBE HIGH RISK, AMPLIFIED: HPV, HIGH-RISK: POSITIVE — AB

## 2016-05-05 LAB — PAP IG W/ RFLX HPV ASCU: PAP Smear Comment: 0

## 2016-05-05 LAB — HEMOGLOBINOPATHY EVALUATION
HEMOGLOBIN F QUANTITATION: 0 % (ref 0.0–2.0)
HGB C: 0 %
HGB S: 0 %
Hemoglobin A2 Quantitation: 2.6 % (ref 0.7–3.1)
Hgb A: 97.4 % (ref 94.0–98.0)

## 2016-05-05 LAB — CYSTIC FIBROSIS MUTATION 97: GENE DIS ANAL CARRIER INTERP BLD/T-IMP: NOT DETECTED

## 2016-05-06 ENCOUNTER — Encounter: Payer: Self-pay | Admitting: Obstetrics & Gynecology

## 2016-05-06 DIAGNOSIS — Z8742 Personal history of other diseases of the female genital tract: Secondary | ICD-10-CM | POA: Insufficient documentation

## 2016-05-06 DIAGNOSIS — R87810 Cervical high risk human papillomavirus (HPV) DNA test positive: Secondary | ICD-10-CM

## 2016-05-06 DIAGNOSIS — R87610 Atypical squamous cells of undetermined significance on cytologic smear of cervix (ASC-US): Secondary | ICD-10-CM | POA: Insufficient documentation

## 2016-05-17 ENCOUNTER — Encounter (HOSPITAL_COMMUNITY): Payer: Self-pay | Admitting: *Deleted

## 2016-05-17 ENCOUNTER — Inpatient Hospital Stay (HOSPITAL_COMMUNITY)
Admission: AD | Admit: 2016-05-17 | Discharge: 2016-05-17 | Disposition: A | Discharge status: Home / Self Care | Payer: Medicaid Other | Source: Ambulatory Visit | Attending: Obstetrics and Gynecology | Admitting: Obstetrics and Gynecology

## 2016-05-17 ENCOUNTER — Telehealth: Payer: Self-pay

## 2016-05-17 DIAGNOSIS — O4691 Antepartum hemorrhage, unspecified, first trimester: Secondary | ICD-10-CM

## 2016-05-17 DIAGNOSIS — Z3A10 10 weeks gestation of pregnancy: Secondary | ICD-10-CM | POA: Diagnosis not present

## 2016-05-17 DIAGNOSIS — O209 Hemorrhage in early pregnancy, unspecified: Secondary | ICD-10-CM | POA: Diagnosis not present

## 2016-05-17 LAB — URINALYSIS, ROUTINE W REFLEX MICROSCOPIC
BILIRUBIN URINE: NEGATIVE
Glucose, UA: NEGATIVE mg/dL
Hgb urine dipstick: NEGATIVE
Ketones, ur: NEGATIVE mg/dL
Leukocytes, UA: NEGATIVE
NITRITE: NEGATIVE
Protein, ur: NEGATIVE mg/dL
Specific Gravity, Urine: 1.01 (ref 1.005–1.030)
pH: 6.5 (ref 5.0–8.0)

## 2016-05-17 NOTE — [facility] Note (Signed)
Pt presents to [facility] with complaints of vaginal bleeding yesterday. States it was bright red in color. No bleeding today. Denies any pain

## 2016-05-17 NOTE — Discharge Instructions (Signed)
Pelvic Rest °Pelvic rest is sometimes recommended for women when:  °· The placenta is partially or completely covering the opening of the cervix (placenta previa). °· There is bleeding between the uterine wall and the amniotic sac in the first trimester (subchorionic hemorrhage). °· The cervix begins to open without labor starting (incompetent cervix, cervical insufficiency). °· The labor is too early (preterm labor). °HOME CARE INSTRUCTIONS °· Do not have sexual intercourse, stimulation, or an orgasm. °· Do not use tampons, douche, or put anything in the vagina. °· Do not lift anything over 10 pounds (4.5 kg). °· Avoid strenuous activity or straining your pelvic muscles. °SEEK MEDICAL CARE IF:  °· You have any vaginal bleeding during pregnancy. Treat this as a potential emergency. °· You have cramping pain felt low in the stomach (stronger than menstrual cramps). °· You notice vaginal discharge (watery, mucus, or bloody). °· You have a low, dull backache. °· There are regular contractions or uterine tightening. °SEEK IMMEDIATE MEDICAL CARE IF: °You have vaginal bleeding and have placenta previa.  °  °This information is not intended to replace advice given to you by your health care provider. Make sure you discuss any questions you have with your health care provider. °  °Document Released: 01/21/2011 Document Revised: 12/19/2011 Document Reviewed: 03/30/2015 °Elsevier Interactive Patient Education ©2016 Elsevier Inc. ° °Vaginal Bleeding During Pregnancy, First Trimester °A small amount of bleeding (spotting) from the vagina is common in early pregnancy. Sometimes the bleeding is normal and is not a problem, and sometimes it is a sign of something serious. Be sure to tell your doctor about any bleeding from your vagina right away. °HOME CARE °· Watch your condition for any changes. °· Follow your doctor's instructions about how active you can be. °· If you are on bed rest: °¨ You may need to stay in bed and only  get up to use the bathroom. °¨ You may be allowed to do some activities. °¨ If you need help, make plans for someone to help you. °· Write down: °¨ The number of pads you use each day. °¨ How often you change pads. °¨ How soaked (saturated) your pads are. °· Do not use tampons. °· Do not douche. °· Do not have sex or orgasms until your doctor says it is okay. °· If you pass any tissue from your vagina, save the tissue so you can show it to your doctor. °· Only take medicines as told by your doctor. °· Do not take aspirin because it can make you bleed. °· Keep all follow-up visits as told by your doctor. °GET HELP IF:  °· You bleed from your vagina. °· You have cramps. °· You have labor pains. °· You have a fever that does not go away after you take medicine. °GET HELP RIGHT AWAY IF:  °· You have very bad cramps in your back or belly (abdomen). °· You pass large clots or tissue from your vagina. °· You bleed more. °· You feel light-headed or weak. °· You pass out (faint). °· You have chills. °· You are leaking fluid or have a gush of fluid from your vagina. °· You pass out while pooping (having a bowel movement). °MAKE SURE YOU: °· Understand these instructions. °· Will watch your condition. °· Will get help right away if you are not doing well or get worse. °  °This information is not intended to replace advice given to you by your health care provider. Make sure you discuss any questions   you have with your health care provider. °  °Document Released: 02/10/2014 Document Reviewed: 02/10/2014 °Elsevier Interactive Patient Education ©2016 Elsevier Inc. ° °

## 2016-05-17 NOTE — MAU Provider Note (Signed)
History     CSN: [id number]  Arrival date and time: 05/17/16 1302   First Provider Initiated Contact with Patient 05/17/16 1330      No chief complaint on file.  HPI [name] is a 25 y.o. G3P1011 at [redacted]w[redacted]d who presents to MAU today with complaint of vaginal bleeding yesterday. The patient states light bleeding similar to the early part of her period noted yesterday that resolved quickly. She is not bleeding currently. She denies other vaginal discharge or abdominal pain. She states last intercourse ~ 5 days ago. She was particularly active with moving furniture and boxes yesterday prior to bleeding.   OB History    Gravida Para Term Preterm AB Living   SAB TAB Ectopic Multiple Live Births   1       1      Past Medical History:  Diagnosis Date  . Asthma     Past Surgical History:  Procedure Laterality Date  . WISDOM TOOTH EXTRACTION      Family History  Problem Relation Age of Onset  . Thyroid disease Mother   . COPD Father   . Cancer Maternal Grandfather   . COPD Paternal Grandmother     Social History  Substance Use Topics  . Smoking status: Former Smoker    Packs/day: 1.00    Quit date: 10/19/2014  . Smokeless tobacco: Never Used  . Alcohol use No     Comment: 1 beer/month    Allergies: No Known Allergies  Prescriptions Prior to Admission  Medication Sig Dispense Refill Last Dose  . albuterol (PROVENTIL HFA;VENTOLIN HFA) 108 (90 Base) MCG/ACT inhaler Inhale 2 puffs into the lungs every 6 (six) hours as needed for wheezing or shortness of breath.   Past Week at Unknown time  . ibuprofen (ADVIL,MOTRIN) 800 MG tablet Take 1 tablet (800 mg total) by mouth 3 (three) times daily. (Patient not taking: Reported on 04/19/2016) 15 tablet 0   . Miconazole Nitrate (MONISTAT 3 VA) Place 1 application vaginally at bedtime.   04/18/2016 at Unknown time  . naproxen (NAPROSYN) 375 MG tablet Take 1 tablet (375 mg total) by mouth 2 (two) times daily.  (Patient not taking: Reported on 04/19/2016) 20 tablet 0   . Prenatal Vit-Fe Fumarate-FA (PRENATAL MULTIVITAMIN) TABS tablet Take 1 tablet by mouth daily at 12 noon.   04/18/2016 at Unknown time    Review of Systems  Constitutional: Negative for fever and malaise/fatigue.  Gastrointestinal: Negative for abdominal pain, constipation, diarrhea, nausea and vomiting.  Genitourinary: Negative for dysuria, frequency and urgency.       Neg - vaginal bleeding, discharge   Physical Exam   Blood pressure 124/73, pulse 74, temperature 98.2 F (36.8 C), resp. rate 18, weight 162 lb (73.5 kg), last menstrual period 03/04/2016, unknown if currently breastfeeding.  Physical Exam  Nursing note and vitals reviewed. Constitutional: She is oriented to person, place, and time. She appears well-developed and well-nourished. No distress.  HENT:  Head: Normocephalic and atraumatic.  Cardiovascular: Normal rate.   Respiratory: Effort normal.  GI: Soft. She exhibits no distension and no mass. There is no tenderness. There is no rebound and no guarding.  Genitourinary: Uterus is enlarged. Uterus is not tender. Cervix exhibits no motion tenderness, no discharge and no friability. No bleeding in the vagina. Vaginal discharge (scant thin, white discharge noted) found.  Neurological: She is alert and oriented to person, place, and time.  Skin: Skin  is warm and dry. No erythema.  Psychiatric: She has a normal mood and affect.  Dilation: Closed Exam by:: Wenzel PA   MAU Course  Procedures None  MDM FHR - 170 bpm with doppler UA today  No bleeding today, +FHTs, cervix closed - patient reassured and pelvic rest advised. Bleeding precautions discussed.   Assessment and Plan  A: SIUP at 3659w4d Vaginal bleeding in pregnancy, first trimester  P: Discharge home Bleeding precautions and pelvic rest discussed Patient advised to follow-up with [facility] as scheduled for routine prenatal care or sooner PRN Patient  may return to MAU as needed or if her condition were to change or worsen   Marny LowensteinJulie N Wenzel, PA-C  05/17/2016, 1:39 PM

## 2016-05-17 NOTE — Telephone Encounter (Signed)
Left message for patient to return call to office.  Aurea Aronov RNBSN 

## 2016-05-17 NOTE — Telephone Encounter (Signed)
-----   Message from Arne ClevelandMandy J Hutchinson, [place] sent at 05/17/2016  3:41 PM EDT ----- If you get a chance to call this one that would be great.  Thanks MH ----- Message ----- From: Tereso NewcomerUgonna A Anyanwu, MD Sent: 05/06/2016   6:08 PM To: Arne ClevelandMandy J Hutchinson, CMA  Needs colposcopy, can be added onto her next visit and booked as a separate visit.

## 2016-05-20 ENCOUNTER — Encounter (HOSPITAL_COMMUNITY): Payer: Self-pay | Admitting: Obstetrics & Gynecology

## 2016-05-24 ENCOUNTER — Ambulatory Visit (INDEPENDENT_AMBULATORY_CARE_PROVIDER_SITE_OTHER): Payer: Medicaid Other | Admitting: Obstetrics and Gynecology

## 2016-05-24 VITALS — BP 113/77 | HR 76 | Temp 98.7°F | Wt 168.1 lb

## 2016-05-24 DIAGNOSIS — R87810 Cervical high risk human papillomavirus (HPV) DNA test positive: Secondary | ICD-10-CM | POA: Diagnosis not present

## 2016-05-24 DIAGNOSIS — Z3491 Encounter for supervision of normal pregnancy, unspecified, first trimester: Secondary | ICD-10-CM

## 2016-05-24 DIAGNOSIS — Z331 Pregnant state, incidental: Secondary | ICD-10-CM

## 2016-05-24 DIAGNOSIS — R87610 Atypical squamous cells of undetermined significance on cytologic smear of cervix (ASC-US): Secondary | ICD-10-CM

## 2016-05-24 LAB — POCT URINALYSIS DIPSTICK
Bilirubin, UA: NEGATIVE
Blood, UA: NEGATIVE
Glucose, UA: NEGATIVE
Ketones, UA: NEGATIVE
LEUKOCYTES UA: NEGATIVE
NITRITE UA: NEGATIVE
PH UA: 8
PROTEIN UA: NEGATIVE
Spec Grav, UA: 1.01
Urobilinogen, UA: 0.2

## 2016-05-24 NOTE — Progress Notes (Signed)
Pt denies concerns at this time. 

## 2016-05-24 NOTE — Progress Notes (Signed)
Subjective:  Amy Little is a 25 y.o. G3P1011 at 7134w4d being seen today for ongoing prenatal care.  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  Patient reports no complaints.  Contractions: Not present. Vag. Bleeding: None.  Movement: Absent. Denies leaking of fluid.   The following portions of the patient's history were reviewed and updated as appropriate: allergies, current medications, past family history, past medical history, past social history, past surgical history and problem list. Problem list updated.  Objective:   Vitals:   05/24/16 0848  BP: 113/77  Pulse: 76  Temp: 98.7 F (37.1 C)  Weight: 168 lb 1.6 oz (76.2 kg)    Fetal Status: Fetal Heart Rate (bpm): 162   Movement: Absent     General:  Alert, oriented and cooperative. Patient is in no acute distress.  Skin: Skin is warm and dry. No rash noted.   Cardiovascular: Normal heart rate noted  Respiratory: Normal respiratory effort, no problems with respiration noted  Abdomen: Soft, gravid, appropriate for gestational age. Pain/Pressure: Absent     Pelvic:  Cervical exam deferred        Extremities: Normal range of motion.  Edema: None  Mental Status: Normal mood and affect. Normal behavior. Normal judgment and thought content.   Urinalysis: Urine Protein: Negative Urine Glucose: Negative  Assessment and Plan:  Pregnancy: G3P1011 at 7334w4d  1. Prenatal care, first trimester - POCT Urinalysis Dipstick  2. Supervision of normal pregnancy, first trimester Patient is doing well. Was seen in MAU for vaginal bleeding which stopped Patient scheduled for 1st screen on 8/21  3. 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  Placed in lithotomy position. Cervix  viewed with speculum and colposcope after application of acetic acid.   Colposcopy adequate?  yes Acetowhite lesions? Yes at 12-2 o'clock Punctation? no Mosaicism?  no Abnormal vasculature?  no Biopsies? no ECC? No Plan to repeat pap smear postpartum   General obstetric precautions including but not limited to vaginal bleeding, contractions, leaking of fluid and fetal movement were reviewed in detail with the patient. Please refer to After Visit Summary for other counseling recommendations.  Return in about 4 weeks (around 06/21/2016).   Catalina AntiguaPeggy Bryah Ocheltree, MD

## 2016-05-30 ENCOUNTER — Ambulatory Visit (HOSPITAL_COMMUNITY)
Admission: RE | Admit: 2016-05-30 | Discharge: 2016-05-30 | Disposition: A | Discharge status: Home / Self Care | Payer: Medicaid Other | Source: Ambulatory Visit | Attending: Obstetrics & Gynecology | Admitting: Obstetrics & Gynecology

## 2016-05-30 ENCOUNTER — Encounter (HOSPITAL_COMMUNITY): Payer: Self-pay

## 2016-05-30 ENCOUNTER — Other Ambulatory Visit: Payer: Self-pay | Admitting: Obstetrics & Gynecology

## 2016-05-30 DIAGNOSIS — Z3682 Encounter for antenatal screening for nuchal translucency: Secondary | ICD-10-CM

## 2016-05-30 DIAGNOSIS — Z3491 Encounter for supervision of normal pregnancy, unspecified, first trimester: Secondary | ICD-10-CM

## 2016-05-30 DIAGNOSIS — Z3A12 12 weeks gestation of pregnancy: Secondary | ICD-10-CM | POA: Diagnosis not present

## 2016-05-30 DIAGNOSIS — Z36 Encounter for antenatal screening of mother: Secondary | ICD-10-CM | POA: Diagnosis not present

## 2016-06-01 ENCOUNTER — Other Ambulatory Visit: Payer: Self-pay | Admitting: Obstetrics and Gynecology

## 2016-06-01 MED ORDER — VALACYCLOVIR HCL 1 G PO TABS: 1000.0000 mg | ORAL_TABLET | Freq: Every day | ORAL | 2 refills | Status: DC

## 2016-06-06 ENCOUNTER — Other Ambulatory Visit (HOSPITAL_COMMUNITY): Payer: Self-pay

## 2016-06-17 ENCOUNTER — Inpatient Hospital Stay (HOSPITAL_COMMUNITY)
Admission: AD | Admit: 2016-06-17 | Discharge: 2016-06-17 | Disposition: A | Discharge status: Home / Self Care | Payer: Medicaid Other | Source: Ambulatory Visit | Attending: Family Medicine | Admitting: Family Medicine

## 2016-06-17 ENCOUNTER — Encounter (HOSPITAL_COMMUNITY): Payer: Self-pay

## 2016-06-17 DIAGNOSIS — Z87891 Personal history of nicotine dependence: Secondary | ICD-10-CM | POA: Diagnosis not present

## 2016-06-17 DIAGNOSIS — O26892 Other specified pregnancy related conditions, second trimester: Secondary | ICD-10-CM | POA: Diagnosis present

## 2016-06-17 DIAGNOSIS — Z79899 Other long term (current) drug therapy: Secondary | ICD-10-CM | POA: Insufficient documentation

## 2016-06-17 DIAGNOSIS — Z331 Pregnant state, incidental: Secondary | ICD-10-CM | POA: Diagnosis not present

## 2016-06-17 DIAGNOSIS — O99512 Diseases of the respiratory system complicating pregnancy, second trimester: Secondary | ICD-10-CM | POA: Insufficient documentation

## 2016-06-17 DIAGNOSIS — Z3492 Encounter for supervision of normal pregnancy, unspecified, second trimester: Secondary | ICD-10-CM

## 2016-06-17 DIAGNOSIS — N949 Unspecified condition associated with female genital organs and menstrual cycle: Secondary | ICD-10-CM | POA: Diagnosis not present

## 2016-06-17 DIAGNOSIS — K5901 Slow transit constipation: Secondary | ICD-10-CM | POA: Diagnosis not present

## 2016-06-17 DIAGNOSIS — Z3A15 15 weeks gestation of pregnancy: Secondary | ICD-10-CM | POA: Diagnosis present

## 2016-06-17 LAB — WET PREP, GENITAL
Clue Cells Wet Prep HPF POC: NONE SEEN
Sperm: NONE SEEN
Trich, Wet Prep: NONE SEEN
Yeast Wet Prep HPF POC: NONE SEEN

## 2016-06-17 LAB — URINALYSIS, ROUTINE W REFLEX MICROSCOPIC
BILIRUBIN URINE: NEGATIVE
GLUCOSE, UA: NEGATIVE mg/dL
Hgb urine dipstick: NEGATIVE
KETONES UR: NEGATIVE mg/dL
Leukocytes, UA: NEGATIVE
NITRITE: NEGATIVE
PH: 7 (ref 5.0–8.0)
Protein, ur: NEGATIVE mg/dL
Specific Gravity, Urine: <1.005 — ABNORMAL LOW (ref 1.005–1.030)

## 2016-06-17 NOTE — MAU Note (Signed)
Left side abdominal pain for 2 days, cervical pain, denies constipation or dysuria, no vaginal bleeding.

## 2016-06-17 NOTE — MAU Note (Signed)
Patient also states that she has been feeling the baby move early this time, but has not felt it in 2 or 3 days.

## 2016-06-17 NOTE — MAU Provider Note (Signed)
History     CSN: [id number]  Arrival date and time: 06/17/16 [id number]   First Provider Initiated Contact with Patient 06/17/16 1029      Chief Complaint  Patient presents with  . Abdominal Pain   G3P1011 @15  wks presents for decreased FM over the last few days. She also c/o LLQ pain for a few weeks. She describes the pain as intermittent, onset sharp then becomes dull and achy and occurrs 6-7 times per day. She denies VB, vaginal discharge and cramping. She has not tried any OTC or home remedies. She admits to poor hydration and having to strain for BM a couple times per week.     OB History    Gravida Para Term Preterm AB Living   3 1 1   1 1    SAB TAB Ectopic Multiple Live Births   1       1      Past Medical History:  Diagnosis Date  . Asthma     Past Surgical History:  Procedure Laterality Date  . WISDOM TOOTH EXTRACTION      Family History  Problem Relation Age of Onset  . Thyroid disease Mother   . COPD Father   . Cancer Maternal Grandfather   . COPD Paternal Grandmother     Social History  Substance Use Topics  . Smoking status: Former Smoker    Packs/day: 1.00    Quit date: 10/19/2014  . Smokeless tobacco: Never Used  . Alcohol use No     Comment: 1 beer/month    Allergies: No Known Allergies  Prescriptions Prior to Admission  Medication Sig Dispense Refill Last Dose  . acetaminophen (TYLENOL) 500 MG tablet Take 500 mg by mouth every 6 (six) hours as needed.   Past Week at Unknown time  . albuterol (PROVENTIL HFA;VENTOLIN HFA) 108 (90 Base) MCG/ACT inhaler Inhale 2 puffs into the lungs every 6 (six) hours as needed for wheezing or shortness of breath.   06/17/2016 at Unknown time  . Prenatal Vit-Fe Fumarate-FA (PRENATAL MULTIVITAMIN) TABS tablet Take 1 tablet by mouth daily at 12 noon.   06/16/2016 at Unknown time  . valACYclovir (VALTREX) 1000 MG tablet Take 1 tablet (1,000 mg total) by mouth daily. (Patient not taking: Reported on 06/17/2016) 5 tablet 2 Not  Taking at Unknown time    Review of Systems  Constitutional: Negative.   Gastrointestinal: Positive for abdominal pain and constipation.  Genitourinary: Negative.    Physical Exam   Blood pressure 118/66, pulse 92, temperature 98.1 F (36.7 C), temperature source Oral, resp. rate 18, height 5\' 7"  (1.702 m), weight 78.5 kg (173 lb), last menstrual period 03/04/2016, unknown if currently breastfeeding.  Physical Exam  Constitutional: She is oriented to person, place, and time. She appears well-developed and well-nourished.  HENT:  Head: Normocephalic and atraumatic.  Neck: Normal range of motion. Neck supple.  Cardiovascular: Normal rate.   Respiratory: Effort normal.  GI: Soft. She exhibits no distension and no mass. There is no tenderness. There is no rebound and no guarding.  Genitourinary:  Genitourinary Comments: External: no lesions Vagina: rugated, parous, thin white discharge SVE: closed, thick   Musculoskeletal: Normal range of motion.  Neurological: She is alert and oriented to person, place, and time.  Skin: Skin is warm and dry.  Psychiatric: She has a normal mood and affect.   FHT: 154 bpm Results for orders placed or performed during the hospital encounter of 06/17/16 (from the past 24 hour(s))  Urinalysis,  Routine w reflex microscopic (not at [facility])     Status: Abnormal   Collection Time: 06/17/16  9:45 AM  Result Value Ref Range   Color, Urine YELLOW YELLOW   APPearance CLEAR CLEAR   Specific Gravity, Urine <1.005 (L) 1.005 - 1.030   pH 7.0 5.0 - 8.0   Glucose, UA NEGATIVE NEGATIVE mg/dL   Hgb urine dipstick NEGATIVE NEGATIVE   Bilirubin Urine NEGATIVE NEGATIVE   Ketones, ur NEGATIVE NEGATIVE mg/dL   Protein, ur NEGATIVE NEGATIVE mg/dL   Nitrite NEGATIVE NEGATIVE   Leukocytes, UA NEGATIVE NEGATIVE  Wet prep, genital     Status: Abnormal   Collection Time: 06/17/16 10:35 AM  Result Value Ref Range   Yeast Wet Prep HPF POC NONE SEEN NONE SEEN   Trich, Wet  Prep NONE SEEN NONE SEEN   Clue Cells Wet Prep HPF POC NONE SEEN NONE SEEN   WBC, Wet Prep HPF POC MODERATE (A) NONE SEEN   Sperm NONE SEEN     MAU Course  Procedures  MDM Labs ordered and reviewed. No evidence of acute abdominal process, PTL, or UTI; likely RL pain and constipation. Reassurance given to pt that fetal movement will vary at this early gestation. Stable for discharge home.  Assessment and Plan   1. Slow transit constipation   2. Round ligament pain   3. Second trimester pregnancy    Discharge home Increase water intake to 6 bottles per day Increase dietary fiber Comfort measures-heating pad, Tylenol, maternity support belt Follow up as scheduled in [facility] in 3 days  Donette Larry, CNM 06/17/2016, 10:40 AM

## 2016-06-17 NOTE — Discharge Instructions (Signed)
Constipation, Adult Constipation is when a person has fewer than three bowel movements a week, has difficulty having a bowel movement, or has stools that are dry, hard, or larger than normal. As people grow older, constipation is more common. A low-fiber diet, not taking in enough fluids, and taking certain medicines may make constipation worse.  CAUSES   Certain medicines, such as antidepressants, pain medicine, iron supplements, antacids, and water pills.   Certain diseases, such as diabetes, irritable bowel syndrome (IBS), thyroid disease, or depression.   Not drinking enough water.   Not eating enough fiber-rich foods.   Stress or travel.   Lack of physical activity or exercise.   Ignoring the urge to have a bowel movement.   Using laxatives too much.  SIGNS AND SYMPTOMS   Having fewer than three bowel movements a week.   Straining to have a bowel movement.   Having stools that are hard, dry, or larger than normal.   Feeling full or bloated.   Pain in the lower abdomen.   Not feeling relief after having a bowel movement.  DIAGNOSIS  Your health care provider will take a medical history and perform a physical exam. Further testing may be done for severe constipation. Some tests may include:  A barium enema X-ray to examine your rectum, colon, and, sometimes, your small intestine.   A sigmoidoscopy to examine your lower colon.   A colonoscopy to examine your entire colon. TREATMENT  Treatment will depend on the severity of your constipation and what is causing it. Some dietary treatments include drinking more fluids and eating more fiber-rich foods. Lifestyle treatments may include regular exercise. If these diet and lifestyle recommendations do not help, your health care provider may recommend taking over-the-counter laxative medicines to help you have bowel movements. Prescription medicines may be prescribed if over-the-counter medicines do not work.    HOME CARE INSTRUCTIONS   Eat foods that have a lot of fiber, such as fruits, vegetables, whole grains, and beans.  Limit foods high in fat and processed sugars, such as french fries, hamburgers, cookies, candies, and soda.   A fiber supplement may be added to your diet if you cannot get enough fiber from foods.   Drink enough fluids to keep your urine clear or pale yellow.   Exercise regularly or as directed by your health care provider.   Go to the restroom when you have the urge to go. Do not hold it.   Only take over-the-counter or prescription medicines as directed by your health care provider. Do not take other medicines for constipation without talking to your health care provider first.  SEEK IMMEDIATE MEDICAL CARE IF:   You have bright red blood in your stool.   Your constipation lasts for more than 4 days or gets worse.   You have abdominal or rectal pain.   You have thin, pencil-like stools.   You have unexplained weight loss. MAKE SURE YOU:   Understand these instructions.  Will watch your condition.  Will get help right away if you are not doing well or get worse.   This information is not intended to replace advice given to you by your health care provider. Make sure you discuss any questions you have with your health care provider.   Document Released: 06/24/2004 Document Revised: 10/17/2014 Document Reviewed: 07/08/2013 Elsevier Interactive Patient Education 2016 Elsevier Inc. Round Ligament Pain The round ligament is a cord of muscle and tissue that helps to support the uterus. It  can become a source of pain during pregnancy if it becomes stretched or twisted as the baby grows. The pain usually begins in the second trimester of pregnancy, and it can come and go until the baby is delivered. It is not a serious problem, and it does not cause harm to the baby. Round ligament pain is usually a short, sharp, and pinching pain, but it can also be a dull,  lingering, and aching pain. The pain is felt in the lower side of the abdomen or in the groin. It usually starts deep in the groin and moves up to the outside of the hip area. Pain can occur with:  A sudden change in position.  Rolling over in bed.  Coughing or sneezing.  Physical activity. HOME CARE INSTRUCTIONS Watch your condition for any changes. Take these steps to help with your pain:  When the pain starts, relax. Then try:  Sitting down.  Flexing your knees up to your abdomen.  Lying on your side with one pillow under your abdomen and another pillow between your legs.  Sitting in a warm bath for 15-20 minutes or until the pain goes away.  Take over-the-counter and prescription medicines only as told by your health care provider.  Move slowly when you sit and stand.  Avoid long walks if they cause pain.  Stop or lessen your physical activities if they cause pain. SEEK MEDICAL CARE IF:  Your pain does not go away with treatment.  You feel pain in your back that you did not have before.  Your medicine is not helping. SEEK IMMEDIATE MEDICAL CARE IF:  You develop a fever or chills.  You develop uterine contractions.  You develop vaginal bleeding.  You develop nausea or vomiting.  You develop diarrhea.  You have pain when you urinate.   This information is not intended to replace advice given to you by your health care provider. Make sure you discuss any questions you have with your health care provider.   Document Released: 07/05/2008 Document Revised: 12/19/2011 Document Reviewed: 12/03/2014 Elsevier Interactive Patient Education [employer] Elsevier Inc.

## 2016-06-20 ENCOUNTER — Ambulatory Visit (INDEPENDENT_AMBULATORY_CARE_PROVIDER_SITE_OTHER): Payer: Medicaid Other | Admitting: Obstetrics & Gynecology

## 2016-06-20 VITALS — BP 107/71 | HR 90 | Temp 98.3°F | Wt 172.1 lb

## 2016-06-20 DIAGNOSIS — Z3492 Encounter for supervision of normal pregnancy, unspecified, second trimester: Secondary | ICD-10-CM | POA: Diagnosis not present

## 2016-06-20 LAB — GC/CHLAMYDIA PROBE AMP (~~LOC~~) NOT AT ARMC
CHLAMYDIA, DNA PROBE: NEGATIVE
Neisseria Gonorrhea: NEGATIVE

## 2016-06-20 NOTE — Progress Notes (Signed)
   PRENATAL VISIT NOTE  Subjective:  Amy Little is a 89[name]25 y.o. G3P1011 at 7631w3d being seen today for ongoing prenatal care.  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  Patient reports no complaints.  Contractions: Not present. Vag. Bleeding: None.  Movement: Present. Denies leaking of fluid.   The following portions of the patient's history were reviewed and updated as appropriate: allergies, current medications, past family history, past medical history, past social history, past surgical history and problem list. Problem list updated.  Objective:   Vitals:   06/20/16 1316  BP: 107/71  Pulse: 90  Temp: 98.3 F (36.8 C)  Weight: 172 lb 1.6 oz (78.1 kg)    Fetal Status:     Movement: Present     General:  Alert, oriented and cooperative. Patient is in no acute distress.  Skin: Skin is warm and dry. No rash noted.   Cardiovascular: Normal heart rate noted  Respiratory: Normal respiratory effort, no problems with respiration noted  Abdomen: Soft, gravid, appropriate for gestational age. Pain/Pressure: Present     Pelvic:  Cervical exam deferred        Extremities: Normal range of motion.  Edema: Trace  Mental Status: Normal mood and affect. Normal behavior. Normal judgment and thought content.   Urinalysis:      Assessment and Plan:  Pregnancy: G3P1011 at 5431w3d  1. Supervision of normal pregnancy, second trimester Screening, had nl first trimester screen - AFP, Quad Screen - US OB DETAIL + 14 WK; Future  Preterm labor symptoms and general obstetric precautions including but not limited to vaginal bleeding, contractions, leaking of fluid and fetal movement were reviewed in detail with the patient. Please refer to After Visit Summary for other counseling recommendations.  Return in about 4 weeks (around 07/18/2016).  Adam PhenixJames  G Arnold, MD

## 2016-06-20 NOTE — Patient Instructions (Signed)

## 2016-06-22 LAB — AFP, QUAD SCREEN
DIA MOM VALUE: 0.67
DIA VALUE (EIA): 117.09 pg/mL
DSR (By Age)    1 IN: 982
DSR (SECOND TRIMESTER) 1 IN: 10000
GESTATIONAL AGE AFP: 15.4 wk
MATERNAL AGE AT EDD: 26 a
MSAFP Mom: 0.98
MSAFP: 26.6 ng/mL
MSHCG MOM: 0.43
MSHCG: 18383 m[IU]/mL
Osb Risk: 10000
Test Results:: NEGATIVE
UE3 MOM: 0.94
Weight: 172 [lb_av]
uE3 Value: 0.61 ng/mL

## 2016-06-27 ENCOUNTER — Emergency Department (HOSPITAL_COMMUNITY): Payer: No Typology Code available for payment source

## 2016-06-27 ENCOUNTER — Emergency Department (HOSPITAL_COMMUNITY)
Admission: EM | Admit: 2016-06-27 | Discharge: 2016-06-27 | Disposition: A | Discharge status: Home / Self Care | Payer: No Typology Code available for payment source | Attending: Emergency Medicine | Admitting: Emergency Medicine

## 2016-06-27 DIAGNOSIS — Y999 Unspecified external cause status: Secondary | ICD-10-CM | POA: Insufficient documentation

## 2016-06-27 DIAGNOSIS — J45909 Unspecified asthma, uncomplicated: Secondary | ICD-10-CM | POA: Diagnosis not present

## 2016-06-27 DIAGNOSIS — Y939 Activity, unspecified: Secondary | ICD-10-CM | POA: Insufficient documentation

## 2016-06-27 DIAGNOSIS — Z3490 Encounter for supervision of normal pregnancy, unspecified, unspecified trimester: Secondary | ICD-10-CM

## 2016-06-27 DIAGNOSIS — Z87891 Personal history of nicotine dependence: Secondary | ICD-10-CM | POA: Diagnosis not present

## 2016-06-27 DIAGNOSIS — O9A212 Injury, poisoning and certain other consequences of external causes complicating pregnancy, second trimester: Secondary | ICD-10-CM | POA: Diagnosis present

## 2016-06-27 DIAGNOSIS — Y92410 Unspecified street and highway as the place of occurrence of the external cause: Secondary | ICD-10-CM | POA: Insufficient documentation

## 2016-06-27 DIAGNOSIS — Z3A17 17 weeks gestation of pregnancy: Secondary | ICD-10-CM | POA: Diagnosis not present

## 2016-06-27 LAB — CBC WITH DIFFERENTIAL/PLATELET
BASOS ABS: 0 10*3/uL (ref 0.0–0.1)
BASOS PCT: 0 %
EOS ABS: 0.4 10*3/uL (ref 0.0–0.7)
Eosinophils Relative: 4 %
HEMATOCRIT: 35.1 % — AB (ref 36.0–46.0)
HEMOGLOBIN: 11.6 g/dL — AB (ref 12.0–15.0)
Lymphocytes Relative: 19 %
Lymphs Abs: 2 10*3/uL (ref 0.7–4.0)
MCH: 30.9 pg (ref 26.0–34.0)
MCHC: 33 g/dL (ref 30.0–36.0)
MCV: 93.4 fL (ref 78.0–100.0)
Monocytes Absolute: 0.8 10*3/uL (ref 0.1–1.0)
Monocytes Relative: 8 %
NEUTROS ABS: 7.1 10*3/uL (ref 1.7–7.7)
NEUTROS PCT: 69 %
Platelets: 223 10*3/uL (ref 150–400)
RBC: 3.76 MIL/uL — ABNORMAL LOW (ref 3.87–5.11)
RDW: 13.1 % (ref 11.5–15.5)
WBC: 10.3 10*3/uL (ref 4.0–10.5)

## 2016-06-27 LAB — COMPREHENSIVE METABOLIC PANEL
ALK PHOS: 47 U/L (ref 38–126)
ALT: 26 U/L (ref 14–54)
ANION GAP: 5 (ref 5–15)
AST: 33 U/L (ref 15–41)
Albumin: 3.1 g/dL — ABNORMAL LOW (ref 3.5–5.0)
BILIRUBIN TOTAL: 0.1 mg/dL — AB (ref 0.3–1.2)
BUN: <5 mg/dL — ABNORMAL LOW (ref 6–20)
CALCIUM: 8.3 mg/dL — AB (ref 8.9–10.3)
CO2: 23 mmol/L (ref 22–32)
CREATININE: 0.54 mg/dL (ref 0.44–1.00)
Chloride: 108 mmol/L (ref 101–111)
Glucose, Bld: 80 mg/dL (ref 65–99)
Potassium: 3.5 mmol/L (ref 3.5–5.1)
SODIUM: 136 mmol/L (ref 135–145)
TOTAL PROTEIN: 5.9 g/dL — AB (ref 6.5–8.1)

## 2016-06-27 LAB — URINALYSIS, ROUTINE W REFLEX MICROSCOPIC
Bilirubin Urine: NEGATIVE
Glucose, UA: NEGATIVE mg/dL
Hgb urine dipstick: NEGATIVE
Ketones, ur: NEGATIVE mg/dL
Leukocytes, UA: NEGATIVE
Nitrite: NEGATIVE
Protein, ur: NEGATIVE mg/dL
Specific Gravity, Urine: 1.011 (ref 1.005–1.030)
pH: 7.5 (ref 5.0–8.0)

## 2016-06-27 MED ORDER — ACETAMINOPHEN 500 MG PO TABS
1000.0000 mg | ORAL_TABLET | Freq: Once | ORAL | Status: AC
  Administered 2016-06-27: 1000 mg via ORAL
  Filled 2016-06-27: qty 2

## 2016-06-27 NOTE — ED Notes (Signed)
Patient aware of need for urine sample, states she will give one soon. RN assured her that her baby's HR is within normal limits and updated her on plan of care.

## 2016-06-27 NOTE — ED Notes (Addendum)
PA-C obtained FHT 154 using doppler.

## 2016-06-27 NOTE — ED Notes (Signed)
Patient transported to Ultrasound 

## 2016-06-27 NOTE — ED Notes (Signed)
PA-C at bedside 

## 2016-06-27 NOTE — ED Notes (Signed)
FHT 124
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
## 2016-06-27 NOTE — Discharge Instructions (Signed)
Your ultrasound looks good today.  We recommend that you have a repeat ultrasound tomorrow or the next day performed by your OBGYN for further/ongoing evaluation.    Should your symptoms worsen, please return to the ER.
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
## 2016-06-27 NOTE — ED Notes (Signed)
Patient verbalized understanding of discharge instructions and denies any further needs or questions at this time. VS stable. Patient ambulatory with steady gait. Escorted to entrance in wheelchair.

## 2016-06-29 ENCOUNTER — Telehealth: Payer: Self-pay | Admitting: *Deleted

## 2016-06-29 NOTE — Telephone Encounter (Signed)
Dr left recommedations for patient to keep her f/u OB appointment.She has an anatomy scan scheduled for 07/26/16.Use Tylenol and moist heat for body aches.If has vaginal bleeding she needs to be seen.Patient was made aware of these recommendations.

## 2016-07-14 ENCOUNTER — Ambulatory Visit (INDEPENDENT_AMBULATORY_CARE_PROVIDER_SITE_OTHER): Payer: Medicaid Other

## 2016-07-14 ENCOUNTER — Other Ambulatory Visit: Payer: Self-pay | Admitting: Obstetrics & Gynecology

## 2016-07-14 DIAGNOSIS — Z3492 Encounter for supervision of normal pregnancy, unspecified, second trimester: Secondary | ICD-10-CM

## 2016-07-14 DIAGNOSIS — Z3689 Encounter for other specified antenatal screening: Secondary | ICD-10-CM | POA: Diagnosis not present

## 2016-07-18 ENCOUNTER — Ambulatory Visit (INDEPENDENT_AMBULATORY_CARE_PROVIDER_SITE_OTHER): Payer: Medicaid Other | Admitting: Obstetrics and Gynecology

## 2016-07-18 DIAGNOSIS — R87810 Cervical high risk human papillomavirus (HPV) DNA test positive: Secondary | ICD-10-CM

## 2016-07-18 DIAGNOSIS — Z3482 Encounter for supervision of other normal pregnancy, second trimester: Secondary | ICD-10-CM

## 2016-07-18 DIAGNOSIS — Z3480 Encounter for supervision of other normal pregnancy, unspecified trimester: Secondary | ICD-10-CM

## 2016-07-18 DIAGNOSIS — R87610 Atypical squamous cells of undetermined significance on cytologic smear of cervix (ASC-US): Secondary | ICD-10-CM

## 2016-07-18 NOTE — Progress Notes (Signed)
Patient is in office for ob visit, states that she has had abdominal cramping from moving/lifting, reports good fetal movement and no contractions or bleeding.

## 2016-07-18 NOTE — Progress Notes (Signed)
   PRENATAL VISIT NOTE  Subjective:  Amy Little is a 25 y.o. G3P1011 at 5751w3d being seen today for ongoing prenatal care.  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  Patient reports no complaints.  Contractions: Not present. Vag. Bleeding: None.  Movement: Present. Denies leaking of fluid.   The following portions of the patient's history were reviewed and updated as appropriate: allergies, current medications, past family history, past medical history, past social history, past surgical history and problem list. Problem list updated.  Objective:   Vitals:   07/18/16 1549  BP: 113/72  Pulse: 70  Temp: 98.8 F (37.1 C)  Weight: 175 lb 9.6 oz (79.7 kg)    Fetal Status: Fetal Heart Rate (bpm): 150   Movement: Present     General:  Alert, oriented and cooperative. Patient is in no acute distress.  Skin: Skin is warm and dry. No rash noted.   Cardiovascular: Normal heart rate noted  Respiratory: Normal respiratory effort, no problems with respiration noted  Abdomen: Soft, gravid, appropriate for gestational age. Pain/Pressure: Present     Pelvic:  Cervical exam deferred        Extremities: Normal range of motion.  Edema: None  Mental Status: Normal mood and affect. Normal behavior. Normal judgment and thought content.   Urinalysis:      Assessment and Plan:  Pregnancy: G3P1011 at 1851w3d  1. Supervision of other normal pregnancy, antepartum Patient is doing well without complaints Ultrasound report not available. To be discussed at next visit  2. 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 Please refer to After Visit  Summary for other counseling recommendations.  Return in about 4 weeks (around 08/15/2016).  Catalina AntiguaPeggy Kenichi Cassada, MD

## 2016-08-15 ENCOUNTER — Ambulatory Visit (INDEPENDENT_AMBULATORY_CARE_PROVIDER_SITE_OTHER): Payer: Medicaid Other | Admitting: Obstetrics & Gynecology

## 2016-08-15 DIAGNOSIS — Z3482 Encounter for supervision of other normal pregnancy, second trimester: Secondary | ICD-10-CM

## 2016-08-15 DIAGNOSIS — Z3480 Encounter for supervision of other normal pregnancy, unspecified trimester: Secondary | ICD-10-CM

## 2016-08-15 NOTE — Progress Notes (Signed)
Patient is in the office, states that she is feeling good today.

## 2016-08-15 NOTE — Patient Instructions (Signed)
Return to clinic for any scheduled appointments or obstetric concerns, or go to MAU for evaluation  

## 2016-08-15 NOTE — Progress Notes (Signed)
   PRENATAL VISIT NOTE  Subjective:  Amy Little is a 25 y.o. G3P1011 at 4134w3d being seen today for ongoing prenatal care.  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  Patient reports no complaints.  Contractions: Not present. Vag. Bleeding: None.  Movement: Present. Denies leaking of fluid.   The following portions of the patient's history were reviewed and updated as appropriate: allergies, current medications, past family history, past medical history, past social history, past surgical history and problem list. Problem list updated.  Objective:   Vitals:   08/15/16 1557  BP: 117/73  Pulse: 70  Temp: 97.3 F (36.3 C)  Weight: 181 lb (82.1 kg)    Fetal Status: Fetal Heart Rate (bpm): 157 Fundal Height: 24 cm Movement: Present     General:  Alert, oriented and cooperative. Patient is in no acute distress.  Skin: Skin is warm and dry. No rash noted.   Cardiovascular: Normal heart rate noted  Respiratory: Normal respiratory effort, no problems with respiration noted  Abdomen: Soft, gravid, appropriate for gestational age. Pain/Pressure: Absent     Pelvic:  Cervical exam deferred        Extremities: Normal range of motion.  Edema: Trace  Mental Status: Normal mood and affect. Normal behavior. Normal judgment and thought content.   Assessment and Plan:  Pregnancy: G3P1011 at 8134w3d  1. Supervision of other normal pregnancy, antepartum Reviewed normal ultrasound and screening results with patient. Preterm labor symptoms and general obstetric precautions including but not limited to vaginal bleeding, contractions, leaking of fluid and fetal movement were reviewed in detail with the patient. Please refer to After Visit Summary for other counseling recommendations.  Return in about 4 weeks (around 09/12/2016) for OB Visit, 2 hr GTT,  TDap, 3rd trimester labs.   Tereso NewcomerUgonna A Anyanwu, MD

## 2016-09-01 ENCOUNTER — Encounter (HOSPITAL_COMMUNITY): Payer: Self-pay

## 2016-09-01 ENCOUNTER — Inpatient Hospital Stay (HOSPITAL_COMMUNITY)
Admission: AD | Admit: 2016-09-01 | Discharge: 2016-09-01 | Disposition: A | Discharge status: Home / Self Care | Payer: Medicaid Other | Source: Ambulatory Visit | Attending: Obstetrics and Gynecology | Admitting: Obstetrics and Gynecology

## 2016-09-01 DIAGNOSIS — K529 Noninfective gastroenteritis and colitis, unspecified: Secondary | ICD-10-CM

## 2016-09-01 DIAGNOSIS — O26892 Other specified pregnancy related conditions, second trimester: Secondary | ICD-10-CM | POA: Diagnosis not present

## 2016-09-01 DIAGNOSIS — J45909 Unspecified asthma, uncomplicated: Secondary | ICD-10-CM | POA: Diagnosis not present

## 2016-09-01 DIAGNOSIS — O99512 Diseases of the respiratory system complicating pregnancy, second trimester: Secondary | ICD-10-CM | POA: Diagnosis not present

## 2016-09-01 DIAGNOSIS — Z3A25 25 weeks gestation of pregnancy: Secondary | ICD-10-CM | POA: Diagnosis not present

## 2016-09-01 DIAGNOSIS — R197 Diarrhea, unspecified: Secondary | ICD-10-CM | POA: Insufficient documentation

## 2016-09-01 DIAGNOSIS — Z87891 Personal history of nicotine dependence: Secondary | ICD-10-CM | POA: Diagnosis not present

## 2016-09-01 DIAGNOSIS — R112 Nausea with vomiting, unspecified: Secondary | ICD-10-CM | POA: Insufficient documentation

## 2016-09-01 LAB — COMPREHENSIVE METABOLIC PANEL
ALBUMIN: 3.4 g/dL — AB (ref 3.5–5.0)
ALK PHOS: 62 U/L (ref 38–126)
ALT: 23 U/L (ref 14–54)
AST: 21 U/L (ref 15–41)
Anion gap: 8 (ref 5–15)
BILIRUBIN TOTAL: 0.6 mg/dL (ref 0.3–1.2)
BUN: 11 mg/dL (ref 6–20)
CALCIUM: 8.4 mg/dL — AB (ref 8.9–10.3)
CO2: 23 mmol/L (ref 22–32)
CREATININE: 0.51 mg/dL (ref 0.44–1.00)
Chloride: 105 mmol/L (ref 101–111)
GFR calc Af Amer: >60 mL/min (ref >60)
GFR calc non Af Amer: >60 mL/min (ref >60)
GLUCOSE: 89 mg/dL (ref 65–99)
Potassium: 3.7 mmol/L (ref 3.5–5.1)
SODIUM: 136 mmol/L (ref 135–145)
TOTAL PROTEIN: 6.7 g/dL (ref 6.5–8.1)

## 2016-09-01 LAB — URINALYSIS, ROUTINE W REFLEX MICROSCOPIC
Bilirubin Urine: NEGATIVE
Glucose, UA: NEGATIVE mg/dL
Hgb urine dipstick: NEGATIVE
Ketones, ur: >80 mg/dL — AB
Leukocytes, UA: NEGATIVE
NITRITE: NEGATIVE
PH: 6.5 (ref 5.0–8.0)
Protein, ur: NEGATIVE mg/dL
SPECIFIC GRAVITY, URINE: 1.02 (ref 1.005–1.030)

## 2016-09-01 MED ORDER — ACETAMINOPHEN 325 MG PO TABS
650.0000 mg | ORAL_TABLET | ORAL | Status: DC | PRN
  Administered 2016-09-01: 650 mg via ORAL
  Filled 2016-09-01: qty 2

## 2016-09-01 MED ORDER — ONDANSETRON HCL 4 MG/2ML IJ SOLN
4.0000 mg | Freq: Once | INTRAMUSCULAR | Status: AC
  Administered 2016-09-01: 4 mg via INTRAVENOUS
  Filled 2016-09-01: qty 2

## 2016-09-01 MED ORDER — ONDANSETRON 4 MG PO TBDP: 4.0000 mg | ORAL_TABLET | Freq: Four times a day (QID) | ORAL | 2 refills | Status: DC | PRN

## 2016-09-01 MED ORDER — LOPERAMIDE HCL 2 MG PO CAPS
2.0000 mg | ORAL_CAPSULE | ORAL | Status: DC | PRN
  Administered 2016-09-01: 2 mg via ORAL
  Filled 2016-09-01: qty 1

## 2016-09-01 MED ORDER — LACTATED RINGERS IV BOLUS (SEPSIS)
1000.0000 mL | Freq: Once | INTRAVENOUS | Status: AC
  Administered 2016-09-01: 1000 mL via INTRAVENOUS

## 2016-09-01 NOTE — Discharge Instructions (Signed)

## 2016-09-01 NOTE — MAU Note (Signed)
Patient presents with c/o N/V since this morning. Patient states that she trough up 5 times today and is also having lower and upper abdominal pain.

## 2016-09-01 NOTE — MAU Provider Note (Signed)
None     Chief Complaint:  Nausea  Elisabeth MostHeather Reiley is  25 y.o. G3P1011 at 2267w6d presents complaining of Nausea vomiting and some diarrhea today.  Her son had the same symptoms several days ago.   Obstetrical/Gynecological History: OB History    Gravida Para Term Preterm AB Living   3 1 1   1 1    SAB TAB Ectopic Multiple Live Births   1       1     Past Medical History: Past Medical History:  Diagnosis Date  . Asthma     Past Surgical History: Past Surgical History:  Procedure Laterality Date  . WISDOM TOOTH EXTRACTION      Family History: Family History  Problem Relation Age of Onset  . Thyroid disease Mother   . COPD Father   . Cancer Maternal Grandfather   . COPD Paternal Grandmother     Social History: Social History  Substance Use Topics  . Smoking status: Former Smoker    Packs/day: 1.00    Quit date: 10/19/2014  . Smokeless tobacco: Never Used  . Alcohol use No     Comment: 1 beer/month    Allergies: No Known Allergies  Meds:  Prescriptions Prior to Admission  Medication Sig Dispense Refill Last Dose  . albuterol (PROVENTIL HFA;VENTOLIN HFA) 108 (90 Base) MCG/ACT inhaler INHALE TWO PUFFS INTO THE LUNGS EVERY 6 (SIX) HOURS AS NEEDED FOR WHEEZING.     . Prenat w/o A-[id number]-DHA (CITRANATAL HARMONY PO) Take by mouth.     Marland Kitchen. acetaminophen (TYLENOL) 500 MG tablet Take 500 mg by mouth every 6 (six) hours as needed for headache.    Not Taking  . albuterol (PROVENTIL HFA;VENTOLIN HFA) 108 (90 Base) MCG/ACT inhaler Inhale 2 puffs into the lungs every 6 (six) hours as needed for wheezing or shortness of breath.   Taking  . Prenatal Vit-Fe Fumarate-FA (PRENATAL MULTIVITAMIN) TABS tablet Take 1 tablet by mouth daily at 12 noon.   Taking  . valACYclovir (VALTREX) 1000 MG tablet Take 1,000 mg by mouth daily. For 5 days   Taking    Review of Systems   Constitutional: Negative for fever and chills Eyes: Negative for visual disturbances Respiratory: Negative  for shortness of breath, dyspnea Cardiovascular: Negative for chest pain or palpitations  Gastrointestinal: Negative for constipation Genitourinary: Negative for dysuria and urgency Musculoskeletal: Negative for back pain, joint pain, myalgias.  Normal ROM  Neurological: Negative for dizziness and headaches    Physical Exam  Blood pressure 131/77, pulse 100, temperature 98.4 F (36.9 C), temperature source Oral, resp. rate 18, last menstrual period 03/04/2016, unknown if currently breastfeeding. GENERAL: Well-developed, well-nourished female in no acute distress.  LUNGS: Clear to auscultation bilaterally.  HEART: Regular rate and rhythm. ABDOMEN: Soft, nontender, nondistended, gravid.  EXTREMITIES: Nontender, no edema, 2+ distal pulses. DTR's 2+ CERVICAL EXAM: Dilatation 0cm   Effacement 0%   Station out of pelvis   Presentation: unsure FHT:  Baseline rate 150 bpm   Variability moderate  Accelerations present   Decelerations none Contractions: Every 0 mins   Labs: Results for orders placed or performed during the hospital encounter of 09/01/16 (from the past 24 hour(s))  Urinalysis, Routine w reflex microscopic ([facility])   Collection Time: 09/01/16  6:40 PM  Result Value Ref Range   Color, Urine YELLOW YELLOW   APPearance CLEAR CLEAR   Specific Gravity, Urine 1.020 1.005 - 1.030   pH 6.5 5.0 - 8.0   Glucose, UA NEGATIVE  NEGATIVE mg/dL   Hgb urine dipstick NEGATIVE NEGATIVE   Bilirubin Urine NEGATIVE NEGATIVE   Ketones, ur >80 (A) NEGATIVE mg/dL   Protein, ur NEGATIVE NEGATIVE mg/dL   Nitrite NEGATIVE NEGATIVE   Leukocytes, UA NEGATIVE NEGATIVE  Comprehensive metabolic panel   Collection Time: 09/01/16  7:42 PM  Result Value Ref Range   Sodium 136 135 - 145 mmol/L   Potassium 3.7 3.5 - 5.1 mmol/L   Chloride 105 101 - 111 mmol/L   CO2 23 22 - 32 mmol/L   Glucose, Bld 89 65 - 99 mg/dL   BUN 11 6 - 20 mg/dL   Creatinine, Ser 6.210.51 0.44 - 1.00 mg/dL   Calcium 8.4 (L)  8.9 - 10.3 mg/dL   Total Protein 6.7 6.5 - 8.1 g/dL   Albumin 3.4 (L) 3.5 - 5.0 g/dL   AST 21 15 - 41 U/L   ALT 23 14 - 54 U/L   Alkaline Phosphatase 62 38 - 126 U/L   Total Bilirubin 0.6 0.3 - 1.2 mg/dL   GFR calc non Af Amer >60 >60 mL/min   GFR calc Af Amer >60 >60 mL/min   Anion gap 8 5 - 15   Imaging Studies:  No results found.  Assessment: Elisabeth MostHeather Czaja is  25 y.o. G3P1011 at 826w6d presents with GI upset.  REsponded well to IVF bolus, Immodium and IV zofran.  Plan: DC home. Rx for ODT zofran  CRESENZO-DISHMAN,Brendan Gruwell 11/23/20179:19 PM

## 2016-09-04 ENCOUNTER — Inpatient Hospital Stay (HOSPITAL_COMMUNITY)
Admission: AD | Admit: 2016-09-04 | Discharge: 2016-09-04 | Disposition: A | Discharge status: Home / Self Care | Payer: Medicaid Other | Source: Ambulatory Visit | Attending: Obstetrics & Gynecology | Admitting: Obstetrics & Gynecology

## 2016-09-04 ENCOUNTER — Inpatient Hospital Stay (HOSPITAL_COMMUNITY): Payer: Medicaid Other

## 2016-09-04 ENCOUNTER — Encounter (HOSPITAL_COMMUNITY): Payer: Self-pay

## 2016-09-04 DIAGNOSIS — O26892 Other specified pregnancy related conditions, second trimester: Secondary | ICD-10-CM | POA: Diagnosis not present

## 2016-09-04 DIAGNOSIS — R1013 Epigastric pain: Secondary | ICD-10-CM | POA: Insufficient documentation

## 2016-09-04 DIAGNOSIS — R110 Nausea: Secondary | ICD-10-CM

## 2016-09-04 DIAGNOSIS — Z3A26 26 weeks gestation of pregnancy: Secondary | ICD-10-CM | POA: Diagnosis not present

## 2016-09-04 DIAGNOSIS — O99512 Diseases of the respiratory system complicating pregnancy, second trimester: Secondary | ICD-10-CM | POA: Insufficient documentation

## 2016-09-04 DIAGNOSIS — Z87891 Personal history of nicotine dependence: Secondary | ICD-10-CM | POA: Diagnosis not present

## 2016-09-04 DIAGNOSIS — K219 Gastro-esophageal reflux disease without esophagitis: Secondary | ICD-10-CM | POA: Diagnosis not present

## 2016-09-04 DIAGNOSIS — R109 Unspecified abdominal pain: Secondary | ICD-10-CM

## 2016-09-04 DIAGNOSIS — J45909 Unspecified asthma, uncomplicated: Secondary | ICD-10-CM | POA: Diagnosis not present

## 2016-09-04 LAB — URINALYSIS, ROUTINE W REFLEX MICROSCOPIC
BILIRUBIN URINE: NEGATIVE
GLUCOSE, UA: NEGATIVE mg/dL
HGB URINE DIPSTICK: NEGATIVE
Ketones, ur: NEGATIVE mg/dL
Nitrite: NEGATIVE
PH: 6.5 (ref 5.0–8.0)
Protein, ur: NEGATIVE mg/dL
SPECIFIC GRAVITY, URINE: 1.02 (ref 1.005–1.030)

## 2016-09-04 LAB — URINE MICROSCOPIC-ADD ON

## 2016-09-04 LAB — COMPREHENSIVE METABOLIC PANEL
ALBUMIN: 2.9 g/dL — AB (ref 3.5–5.0)
ALT: 23 U/L (ref 14–54)
AST: 21 U/L (ref 15–41)
Alkaline Phosphatase: 53 U/L (ref 38–126)
Anion gap: 6 (ref 5–15)
BUN: 5 mg/dL — AB (ref 6–20)
CHLORIDE: 107 mmol/L (ref 101–111)
CO2: 24 mmol/L (ref 22–32)
CREATININE: 0.5 mg/dL (ref 0.44–1.00)
Calcium: 7.9 mg/dL — ABNORMAL LOW (ref 8.9–10.3)
GFR calc Af Amer: >60 mL/min (ref >60)
GFR calc non Af Amer: >60 mL/min (ref >60)
Glucose, Bld: 89 mg/dL (ref 65–99)
POTASSIUM: 3.7 mmol/L (ref 3.5–5.1)
SODIUM: 137 mmol/L (ref 135–145)
Total Bilirubin: 0.1 mg/dL — ABNORMAL LOW (ref 0.3–1.2)
Total Protein: 6 g/dL — ABNORMAL LOW (ref 6.5–8.1)

## 2016-09-04 LAB — CBC WITH DIFFERENTIAL/PLATELET
BASOS ABS: 0 10*3/uL (ref 0.0–0.1)
BASOS PCT: 0 %
EOS ABS: 0.2 10*3/uL (ref 0.0–0.7)
EOS PCT: 3 %
HCT: 32.2 % — ABNORMAL LOW (ref 36.0–46.0)
Hemoglobin: 11.2 g/dL — ABNORMAL LOW (ref 12.0–15.0)
LYMPHS PCT: 23 %
Lymphs Abs: 1.6 10*3/uL (ref 0.7–4.0)
MCH: 31.5 pg (ref 26.0–34.0)
MCHC: 34.8 g/dL (ref 30.0–36.0)
MCV: 90.4 fL (ref 78.0–100.0)
MONO ABS: 0.3 10*3/uL (ref 0.1–1.0)
Monocytes Relative: 4 %
Neutro Abs: 4.9 10*3/uL (ref 1.7–7.7)
Neutrophils Relative %: 70 %
PLATELETS: 188 10*3/uL (ref 150–400)
RBC: 3.56 MIL/uL — AB (ref 3.87–5.11)
RDW: 13 % (ref 11.5–15.5)
WBC: 7 10*3/uL (ref 4.0–10.5)

## 2016-09-04 MED ORDER — FAMOTIDINE 20 MG PO TABS
40.0000 mg | ORAL_TABLET | Freq: Once | ORAL | Status: AC
  Administered 2016-09-04: 40 mg via ORAL
  Filled 2016-09-04: qty 2

## 2016-09-04 MED ORDER — LANSOPRAZOLE 30 MG PO CPDR: 30.0000 mg | DELAYED_RELEASE_CAPSULE | Freq: Every day | ORAL | 2 refills | Status: DC

## 2016-09-04 NOTE — Discharge Instructions (Signed)
Gastroesophageal Reflux Disease, Adult Normally, food travels down the esophagus and stays in the stomach to be digested. However, when a person has gastroesophageal reflux disease (GERD), food and stomach acid move back up into the esophagus. When this happens, the esophagus becomes sore and inflamed. Over time, GERD can create small holes (ulcers) in the lining of the esophagus. What are the causes? This condition is caused by a problem with the muscle between the esophagus and the stomach (lower esophageal sphincter, or LES). Normally, the LES muscle closes after food passes through the esophagus to the stomach. When the LES is weakened or abnormal, it does not close properly, and that allows food and stomach acid to go back up into the esophagus. The LES can be weakened by certain dietary substances, medicines, and medical conditions, including:  Tobacco use.  Pregnancy.  Having a hiatal hernia.  Heavy alcohol use.  Certain foods and beverages, such as coffee, chocolate, onions, and peppermint. What increases the risk? This condition is more likely to develop in:  People who have an increased body weight.  People who have connective tissue disorders.  People who use NSAID medicines. What are the signs or symptoms? Symptoms of this condition include:  Heartburn.  Difficult or painful swallowing.  The feeling of having a lump in the throat.  Abitter taste in the mouth.  Bad breath.  Having a large amount of saliva.  Having an upset or bloated stomach.  Belching.  Chest pain.  Shortness of breath or wheezing.  Ongoing (chronic) cough or a night-time cough.  Wearing away of tooth enamel.  Weight loss. Different conditions can cause chest pain. Make sure to see your health care provider if you experience chest pain. How is this diagnosed? Your health care provider will take a medical history and perform a physical exam. To determine if you have mild or severe GERD,  your health care provider may also monitor how you respond to treatment. You may also have other tests, including:  An endoscopy toexamine your stomach and esophagus with a small camera.  A test thatmeasures the acidity level in your esophagus.  A test thatmeasures how much pressure is on your esophagus.  A barium swallow or modified barium swallow to show the shape, size, and functioning of your esophagus. How is this treated? The goal of treatment is to help relieve your symptoms and to prevent complications. Treatment for this condition may vary depending on how severe your symptoms are. Your health care provider may recommend:  Changes to your diet.  Medicine.  Surgery. Follow these instructions at home: Diet  Follow a diet as recommended by your health care provider. This may involve avoiding foods and drinks such as:  Coffee and tea (with or without caffeine).  Drinks that containalcohol.  Energy drinks and sports drinks.  Carbonated drinks or sodas.  Chocolate and cocoa.  Peppermint and mint flavorings.  Garlic and onions.  Horseradish.  Spicy and acidic foods, including peppers, chili powder, curry powder, vinegar, hot sauces, and barbecue sauce.  Citrus fruit juices and citrus fruits, such as oranges, lemons, and limes.  Tomato-based foods, such as red sauce, chili, salsa, and pizza with red sauce.  Fried and fatty foods, such as donuts, french fries, potato chips, and high-fat dressings.  High-fat meats, such as hot dogs and fatty cuts of red and white meats, such as rib eye steak, sausage, ham, and bacon.  High-fat dairy items, such as whole milk, butter, and cream cheese.  Eat small, frequent meals instead of large meals.  Avoid drinking large amounts of liquid with your meals.  Avoid eating meals during the 2-3 hours before bedtime.  Avoid lying down right after you eat.  Do not exercise right after you eat. General instructions  Pay  attention to any changes in your symptoms.  Take over-the-counter and prescription medicines only as told by your health care provider. Do not take aspirin, ibuprofen, or other NSAIDs unless your health care provider told you to do so.  Do not use any tobacco products, including cigarettes, chewing tobacco, and e-cigarettes. If you need help quitting, ask your health care provider.  Wear loose-fitting clothing. Do not wear anything tight around your waist that causes pressure on your abdomen.  Raise (elevate) the head of your bed 6 inches (15cm).  Try to reduce your stress, such as with yoga or meditation. If you need help reducing stress, ask your health care provider.  If you are overweight, reduce your weight to an amount that is healthy for you. Ask your health care provider for guidance about a safe weight loss goal.  Keep all follow-up visits as told by your health care provider. This is important. Contact a health care provider if:  You have new symptoms.  You have unexplained weight loss.  You have difficulty swallowing, or it hurts to swallow.  You have wheezing or a persistent cough.  Your symptoms do not improve with treatment.  You have a hoarse voice. Get help right away if:  You have pain in your arms, neck, jaw, teeth, or back.  You feel sweaty, dizzy, or light-headed.  You have chest pain or shortness of breath.  You vomit and your vomit looks like blood or coffee grounds.  You faint.  Your stool is bloody or black.  You cannot swallow, drink, or eat.  A prescription for Prevacid is being sent into your pharmacy.  It takes approximately 3 days for the full benefits to be appreciated.   This information is not intended to replace advice given to you by your health care provider. Make sure you discuss any questions you have with your health care provider. Document Released: 07/06/2005 Document Revised: 02/24/2016 Document Reviewed: 01/21/2015 Elsevier  Interactive Patient Education  2017 [employer]

## 2016-09-04 NOTE — Progress Notes (Signed)
Amy Little. Lawson CNM at bedside

## 2016-09-04 NOTE — Progress Notes (Signed)
Reactive NST no contractions discontinued efm

## 2016-09-04 NOTE — MAU Note (Addendum)
Patient presents with ongoing upper abdominal pain and and nausea since 11/23, was seen in MAU for this, patient reports this has not gotten any better. Pain has gotten worse, mid upper abdominal radiated downward to umbilicus. Patient denies any vaginal bleeding. Last had something to drink which was water around 7 this morning.

## 2016-09-04 NOTE — MAU Provider Note (Signed)
History   G3P1011 @ 26.2 wks in with epigastric pain and nausea. States this all started on Thursday with nausea,vomiting and epigastric pain. States no more vomiting but nausea and epigastric pain persist.  CSN: [id number]  Arrival date & time 09/04/16  [id number]   First Provider Initiated Contact with Patient 09/04/16 1005      Chief Complaint  Patient presents with  . Abdominal Pain  . Nausea    HPI  Past Medical History:  Diagnosis Date  . Asthma     Past Surgical History:  Procedure Laterality Date  . WISDOM TOOTH EXTRACTION      Family History  Problem Relation Age of Onset  . Thyroid disease Mother   . COPD Father   . Cancer Maternal Grandfather   . COPD Paternal Grandmother     Social History  Substance Use Topics  . Smoking status: Former Smoker    Packs/day: 1.00    Quit date: 10/19/2014  . Smokeless tobacco: Never Used  . Alcohol use No     Comment: 1 beer/month    OB History    Gravida Para Term Preterm AB Living   3 1 1   1 1    SAB TAB Ectopic Multiple Live Births   1       1      Review of Systems  Constitutional: Negative.   HENT: Negative.   Eyes: Negative.   Respiratory: Negative.   Cardiovascular: Negative.   Gastrointestinal: Positive for abdominal pain and nausea.       Epigastric pain  Endocrine: Negative.   Genitourinary: Negative.   Musculoskeletal: Negative.   Skin: Negative.     Allergies  Patient has no known allergies.  Home Medications    BP 129/69 (BP Location: Right Arm)   Pulse 80   Temp 97.9 F (36.6 C) (Oral)   Resp 18   Ht 5\' 7"  (1.702 m)   Wt 181 lb (82.1 kg)   LMP 03/04/2016 (Exact Date)   BMI 28.35 kg/m   Physical Exam  Constitutional: She is oriented to person, place, and time. She appears well-developed and well-nourished.  HENT:  Head: Normocephalic.  Eyes: Pupils are equal, round, and reactive to light.  Neck: Normal range of motion.  Cardiovascular: Normal rate and regular rhythm.    Pulmonary/Chest: Effort normal and breath sounds normal.  Abdominal: Soft. Bowel sounds are normal.  Musculoskeletal: Normal range of motion.  Neurological: She is alert and oriented to person, place, and time.  Skin: Skin is warm and dry.  Psychiatric: She has a normal mood and affect. Her behavior is normal. Judgment and thought content normal.    MAU Course  Procedures (including critical care time)  Labs Reviewed  URINALYSIS, ROUTINE W REFLEX MICROSCOPIC (NOT AT [facility]) - Abnormal; Notable for the following:       Result Value   Leukocytes, UA TRACE (*)    All other components within normal limits  URINE MICROSCOPIC-ADD ON - Abnormal; Notable for the following:    Squamous Epithelial / LPF 0-5 (*)    Bacteria, UA FEW (*)    All other components within normal limits  CBC WITH DIFFERENTIAL/PLATELET - Abnormal; Notable for the following:    RBC 3.56 (*)    Hemoglobin 11.2 (*)    HCT 32.2 (*)    All other components within normal limits  COMPREHENSIVE METABOLIC PANEL - Abnormal; Notable for the following:    Calcium 7.9 (*)    Total Protein 6.0 (*)  Albumin 2.9 (*)    Total Bilirubin 0.1 (*)    All other components within normal limits   No results found.   No diagnosis found.    MDM  preg at 26.2 wks Epigastric pain and nausea Stable maternal fetal unit Reassuring FHR pattern with no uc's Plan: CBC done and WNL, CMP done and WNL, US done and  . pepsid 40 mg given and pt astaes feeling better Will d/c home with rx for pervicid.

## 2016-09-04 NOTE — MAU Note (Signed)
Notified [facility] [place]. Amy Little patient presents with upper abdominal pain worsening in intensity x 3 days with nausea received orders.

## 2016-09-12 ENCOUNTER — Ambulatory Visit (INDEPENDENT_AMBULATORY_CARE_PROVIDER_SITE_OTHER): Payer: Medicaid Other | Admitting: Obstetrics and Gynecology

## 2016-09-12 ENCOUNTER — Other Ambulatory Visit: Payer: Medicaid Other

## 2016-09-12 VITALS — BP 122/79 | HR 91 | Wt 182.8 lb

## 2016-09-12 DIAGNOSIS — R87610 Atypical squamous cells of undetermined significance on cytologic smear of cervix (ASC-US): Secondary | ICD-10-CM

## 2016-09-12 DIAGNOSIS — Z3483 Encounter for supervision of other normal pregnancy, third trimester: Secondary | ICD-10-CM

## 2016-09-12 DIAGNOSIS — R87810 Cervical high risk human papillomavirus (HPV) DNA test positive: Secondary | ICD-10-CM

## 2016-09-12 MED ORDER — TETANUS-DIPHTH-ACELL PERTUSSIS 5-2.5-18.5 LF-MCG/0.5 IM SUSP
0.5000 mL | Freq: Once | INTRAMUSCULAR | Status: AC
  Administered 2016-09-12: 0.5 mL via INTRAMUSCULAR

## 2016-09-12 NOTE — Progress Notes (Signed)
Patient is in the office today for her glucose screening , Tdap, and visit.

## 2016-09-12 NOTE — Progress Notes (Signed)
   PRENATAL VISIT NOTE  Subjective:  Amy Little is a 25 y.o. G3P1011 at 7226w3d being seen today for ongoing prenatal care.  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  Patient reports no complaints.  Contractions: Not present. Vag. Bleeding: None.  Movement: Present. Denies leaking of fluid.   The following portions of the patient's history were reviewed and updated as appropriate: allergies, current medications, past family history, past medical history, past social history, past surgical history and problem list. Problem list updated.  Objective:   Vitals:   09/12/16 0903  BP: 122/79  Pulse: 91  Weight: 82.9 kg (182 lb 12.8 oz)    Fetal Status: Fetal Heart Rate (bpm): 160 Fundal Height: 27 cm Movement: Present     General:  Alert, oriented and cooperative. Patient is in no acute distress.  Skin: Skin is warm and dry. No rash noted.   Cardiovascular: Normal heart rate noted  Respiratory: Normal respiratory effort, no problems with respiration noted  Abdomen: Soft, gravid, appropriate for gestational age. Pain/Pressure: Absent     Pelvic:  Cervical exam deferred        Extremities: Normal range of motion.  Edema: None  Mental Status: Normal mood and affect. Normal behavior. Normal judgment and thought content.   Assessment and Plan:  Pregnancy: G3P1011 at 5226w3d  1. Encounter for supervision of other normal pregnancy in third trimester Patient is doing well without complaints 2hr glucola, Tdap and labs today Patient reports symptoms of acid reflux which resolved but stated that Prevacid was not covered by her insurance. Informed patient to call if a different prescription was needed. She declined prescription today  2. 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 Please refer to After Visit Summary for other counseling recommendations.  No Follow-up on file.   Catalina AntiguaPeggy Dontavia Brand, MD

## 2016-09-13 LAB — CBC
HEMOGLOBIN: 12 g/dL (ref 11.1–15.9)
Hematocrit: 36.7 % (ref 34.0–46.6)
MCH: 30.5 pg (ref 26.6–33.0)
MCHC: 32.7 g/dL (ref 31.5–35.7)
MCV: 93 fL (ref 79–97)
PLATELETS: 264 10*3/uL (ref 150–379)
RBC: 3.93 x10E6/uL (ref 3.77–5.28)
RDW: 12.9 % (ref 12.3–15.4)
WBC: 10.1 10*3/uL (ref 3.4–10.8)

## 2016-09-13 LAB — RPR: RPR: NONREACTIVE

## 2016-09-13 LAB — GLUCOSE TOLERANCE, 2 HOURS W/ 1HR
GLUCOSE, 1 HOUR: 122 mg/dL (ref 65–179)
GLUCOSE, 2 HOUR: 111 mg/dL (ref 65–152)
Glucose, Fasting: 82 mg/dL (ref 65–91)

## 2016-09-13 LAB — HIV ANTIBODY (ROUTINE TESTING W REFLEX): HIV SCREEN 4TH GENERATION: NONREACTIVE

## 2016-10-04 ENCOUNTER — Encounter: Payer: Medicaid Other | Admitting: Certified Nurse Midwife

## 2016-10-06 ENCOUNTER — Telehealth: Payer: Self-pay

## 2016-10-06 ENCOUNTER — Inpatient Hospital Stay (HOSPITAL_COMMUNITY)
Admission: AD | Admit: 2016-10-06 | Discharge: 2016-10-06 | Disposition: A | Discharge status: Home / Self Care | Payer: Medicaid Other | Source: Ambulatory Visit | Attending: Obstetrics & Gynecology | Admitting: Obstetrics & Gynecology

## 2016-10-06 ENCOUNTER — Encounter (HOSPITAL_COMMUNITY): Payer: Self-pay

## 2016-10-06 DIAGNOSIS — Z3483 Encounter for supervision of other normal pregnancy, third trimester: Secondary | ICD-10-CM

## 2016-10-06 DIAGNOSIS — Z87891 Personal history of nicotine dependence: Secondary | ICD-10-CM | POA: Insufficient documentation

## 2016-10-06 DIAGNOSIS — H539 Unspecified visual disturbance: Secondary | ICD-10-CM

## 2016-10-06 DIAGNOSIS — H538 Other visual disturbances: Secondary | ICD-10-CM | POA: Insufficient documentation

## 2016-10-06 DIAGNOSIS — O26893 Other specified pregnancy related conditions, third trimester: Secondary | ICD-10-CM | POA: Diagnosis not present

## 2016-10-06 DIAGNOSIS — Z3A30 30 weeks gestation of pregnancy: Secondary | ICD-10-CM | POA: Diagnosis not present

## 2016-10-06 DIAGNOSIS — R87810 Cervical high risk human papillomavirus (HPV) DNA test positive: Secondary | ICD-10-CM

## 2016-10-06 DIAGNOSIS — R87610 Atypical squamous cells of undetermined significance on cytologic smear of cervix (ASC-US): Secondary | ICD-10-CM

## 2016-10-06 NOTE — MAU Note (Signed)
Pt c/o blurred vision that started a couple weeks ago and got worse yesterday. Pt denies headache and epigastric pain. Pt states baby is moving normally. Pt denies bleeding, leaking, and contractions.

## 2016-10-06 NOTE — Telephone Encounter (Signed)
Patient called and stated that she has been having blurred vision and dizziness for 2 weeks and that it got so bad last night that she is now having to squint to see. Advised to go to hospital as soon as possible.

## 2016-10-06 NOTE — MAU Provider Note (Signed)
Chief Complaint:  blurred vison   First Provider Initiated Contact with Patient 10/06/16 1240      HPI: Amy Little is a 25 y.o. G3P1011 at 4530w6dwho presents to maternity admissions reporting worsening blurred vision in last 2 weeks. She reports she had blurred vision in her previous pregnancy that improved but never fully resolved after her son was born. She reports that it has been difficult to read road signs at night since 2012, when she had her son.  She reports she did not notice the vision changes in the daytime, though, until recently.  She now reports in the last 2 weeks she has trouble reading the words on the guide on her television when she is a few feet away. The symptom is worsening, and now even faces on the television are blurred.  Squinting makes it easier for her to see. Darkness/nighttime seems to make the symptom worse. She has never had an eye exam and has never worn glasses or contacts.  She denies h/a, epigastric pain. She reports good fetal movement, denies LOF, vaginal bleeding, vaginal itching/burning, urinary symptoms, dizziness, n/v, or fever/chills.    HPI  Past Medical History: Past Medical History:  Diagnosis Date  . Asthma     Past obstetric history: OB History  Gravida Para Term Preterm AB Living  3 1 1   1 1   SAB TAB Ectopic Multiple Live Births  1       1    # Outcome Date GA Lbr Len/2nd Weight Sex Delivery Anes PTL Lv  3 Current           2 Term 2012    M Vag-Spont EPI N LIV  1 SAB               Past Surgical History: Past Surgical History:  Procedure Laterality Date  . WISDOM TOOTH EXTRACTION      Family History: Family History  Problem Relation Age of Onset  . Thyroid disease Mother   . COPD Father   . Cancer Maternal Grandfather   . COPD Paternal Grandmother     Social History: Social History  Substance Use Topics  . Smoking status: Former Smoker    Packs/day: 1.00    Quit date: 10/19/2014  . Smokeless tobacco: Never Used  .  Alcohol use No     Comment: 1 beer/month    Allergies: No Known Allergies  Meds:  Prescriptions Prior to Admission  Medication Sig Dispense Refill Last Dose  . acetaminophen (TYLENOL) 500 MG tablet Take 500 mg by mouth every 6 (six) hours as needed for headache.    Taking  . albuterol (PROVENTIL HFA;VENTOLIN HFA) 108 (90 Base) MCG/ACT inhaler Inhale 2 puffs into the lungs every 6 (six) hours as needed for wheezing or shortness of breath.   Taking  . lansoprazole (PREVACID) 30 MG capsule Take 1 capsule (30 mg total) by mouth daily at 12 noon. (Patient not taking: Reported on 09/12/2016) 30 capsule 2 Not Taking  . ondansetron (ZOFRAN ODT) 4 MG disintegrating tablet Take 1 tablet (4 mg total) by mouth every 6 (six) hours as needed for nausea. (Patient not taking: Reported on 09/12/2016) 30 tablet 2 Not Taking  . Prenatal Vit-Fe Fumarate-FA (PRENATAL MULTIVITAMIN) TABS tablet Take 1 tablet by mouth daily at 12 noon.    Taking  . valACYclovir (VALTREX) 1000 MG tablet Take 1,000 mg by mouth daily. For 5 days   Taking    ROS:  Review of Systems  Constitutional:  Negative for chills, fatigue and fever.  Eyes: Positive for visual disturbance. Negative for photophobia, pain and discharge.  Respiratory: Negative for shortness of breath.   Cardiovascular: Negative for chest pain.  Genitourinary: Negative for difficulty urinating, dysuria, flank pain, pelvic pain, vaginal bleeding, vaginal discharge and vaginal pain.  Neurological: Negative for dizziness and headaches.  Psychiatric/Behavioral: Negative.      I have reviewed patient's Past Medical Hx, Surgical Hx, Family Hx, Social Hx, medications and allergies.   Physical Exam   Patient Vitals for the past 24 hrs:  BP Temp Temp src Pulse Resp SpO2 Height Weight  10/06/16 1220 - - - 94 - 99 % - -  10/06/16 1212 - - - 100 - 99 % - -  10/06/16 1201 128/79 98.1 F (36.7 C) Oral 99 17 99 % 5\' 7"  (1.702 m) 182 lb (82.6 kg)   Constitutional:  Well-developed, well-nourished female in no acute distress.  Cardiovascular: normal rate Respiratory: normal effort GI: Abd soft, non-tender, gravid appropriate for gestational age.  MS: Extremities nontender, no edema, normal ROM Neurological - alert, oriented, normal speech, no focal findings or movement disorder noted, screening mental status exam normal, neck supple without rigidity, cranial nerves II through XII intact, DTR's normal and symmetric, motor and sensory grossly normal bilaterally, normal muscle tone, no tremors, strength 5/5 GU: Neg CVAT.    FHT:  Baseline 135 , moderate variability, accelerations present, no decelerations Contractions: None on toco or to palpation   Labs: No results found for this or any previous visit (from the past 24 hour(s)). A/Positive/-- (07/19 1001)  Imaging:  No results found.  MAU Course/MDM: Neuro exam is wnl.  All VS wnl.   NST reviewed Likely vision changes of pregnancy superimposed on underlying vision changes requiring corrective lenses Reassurance provided to pt about neurological exam and VS.  Pt to see optometry/opthamology as soon as possible. Pt stable at time of discharge.   Assessment: 1. Changes in vision   2. ASCUS with positive high risk HPV cervical   3. Encounter for supervision of other normal pregnancy in third trimester   4. Blurred vision, bilateral     Plan: Discharge home Labor precautions and fetal kick counts  Follow-up Information    [facility] up.   Why:  As scheduled, return to MAU as needed for emergencies Contact information: [address] [place] [phone]       Optometry or Opthamalogy Follow up.   Why:  For eye exam as soon as possible         Allergies as of 10/06/2016   No Known Allergies     Medication List    STOP taking these medications   lansoprazole 30 MG capsule Commonly known as:  PREVACID    ondansetron 4 MG disintegrating tablet Commonly known as:  ZOFRAN ODT     TAKE these medications   acetaminophen 500 MG tablet Commonly known as:  TYLENOL Take 500 mg by mouth every 6 (six) hours as needed for headache.   albuterol 108 (90 Base) MCG/ACT inhaler Commonly known as:  PROVENTIL HFA;VENTOLIN HFA Inhale 2 puffs into the lungs every 6 (six) hours as needed for wheezing or shortness of breath.   prenatal multivitamin Tabs tablet Take 1 tablet by mouth daily at 12 noon.   valACYclovir 1000 MG tablet Commonly known as:  VALTREX Take 1,000 mg by mouth daily. For 5 days       Misty StanleyLisa  Left[place] Certified Nurse-Midwife 10/06/2016 1:02 PM

## 2016-10-10 NOTE — L&D Delivery Note (Signed)
Delivery Note At 6:04 AM a viable female was delivered via Vaginal, Spontaneous Delivery (Presentation: vertex; OA).  APGAR: 9, 9; weight pending  .   Placenta status: spontaneous, intact.  Cord: 3VC with the following complications: none.    Anesthesia:  epidural Episiotomy: None Lacerations: 2nd degree Suture Repair: 3.0 vicryl Est. Blood Loss (mL):  150  Mom to postpartum.  Baby to Couplet care / Skin to Skin.  Levie HeritageJacob J Stinson 11/30/2016, 6:35 AM

## 2016-10-13 ENCOUNTER — Encounter (HOSPITAL_COMMUNITY): Payer: Self-pay | Admitting: *Deleted

## 2016-10-13 ENCOUNTER — Inpatient Hospital Stay (HOSPITAL_COMMUNITY)
Admission: AD | Admit: 2016-10-13 | Discharge: 2016-10-13 | Disposition: A | Discharge status: Home / Self Care | Payer: Medicaid Other | Source: Ambulatory Visit | Attending: Obstetrics & Gynecology | Admitting: Obstetrics & Gynecology

## 2016-10-13 DIAGNOSIS — Z3A31 31 weeks gestation of pregnancy: Secondary | ICD-10-CM | POA: Diagnosis not present

## 2016-10-13 DIAGNOSIS — J45909 Unspecified asthma, uncomplicated: Secondary | ICD-10-CM | POA: Insufficient documentation

## 2016-10-13 DIAGNOSIS — Z87891 Personal history of nicotine dependence: Secondary | ICD-10-CM | POA: Diagnosis not present

## 2016-10-13 DIAGNOSIS — O368190 Decreased fetal movements, unspecified trimester, not applicable or unspecified: Secondary | ICD-10-CM

## 2016-10-13 DIAGNOSIS — O99513 Diseases of the respiratory system complicating pregnancy, third trimester: Secondary | ICD-10-CM | POA: Diagnosis not present

## 2016-10-13 DIAGNOSIS — O368130 Decreased fetal movements, third trimester, not applicable or unspecified: Secondary | ICD-10-CM | POA: Diagnosis not present

## 2016-10-13 DIAGNOSIS — R87810 Cervical high risk human papillomavirus (HPV) DNA test positive: Secondary | ICD-10-CM

## 2016-10-13 DIAGNOSIS — R87610 Atypical squamous cells of undetermined significance on cytologic smear of cervix (ASC-US): Secondary | ICD-10-CM

## 2016-10-13 LAB — URINALYSIS, ROUTINE W REFLEX MICROSCOPIC
BILIRUBIN URINE: NEGATIVE
GLUCOSE, UA: NEGATIVE mg/dL
KETONES UR: NEGATIVE mg/dL
NITRITE: NEGATIVE
Protein, ur: NEGATIVE mg/dL
Specific Gravity, Urine: 1.002 — ABNORMAL LOW (ref 1.005–1.030)
pH: 7 (ref 5.0–8.0)

## 2016-10-13 NOTE — MAU Note (Addendum)
PT  SAYS SHE  HAS  FELT  LESS MOVEMENT . USUALLY  VERY ACTIVE  AT 8-9 PM    BUT  NOT TONIGHT.   PT  SAYS  NO MEDS  OR   ANYTHING  THAT  WOULD  MAKE  BABY  SLEEPY  .  PNC- WITH [facility]INA.     NO UC'S  BUT  HAS BACK ACHE  SOMETIMES.        HAS HX  OF  HSV-   LAST OUTBREAK- NOV  2017

## 2016-10-13 NOTE — Discharge Instructions (Signed)
Come to the MAU (maternity admission unit) for 1) Strong contractions every 2-3 minutes for at least 1 hour that do no go away when you drink water or take a warm shower. These contractions will be so strong all you can do is breath through them 2) Vaginal bleeding- anything more than spotting 3) Loss of fluid like you broke your water 4) Decreased movement of your baby   Introduction Patient Name: ________________________________________________ Patient Due Date: ____________________ What is a fetal movement count? A fetal movement count is the number of times that you feel your baby move during a certain amount of time. This may also be called a fetal kick count. A fetal movement count is recommended for every pregnant woman. You may be asked to start counting fetal movements as early as week 28 of your pregnancy. Pay attention to when your baby is most active. You may notice your baby's sleep and wake cycles. You may also notice things that make your baby move more. You should do a fetal movement count:  When your baby is normally most active.  At the same time each day. A good time to count movements is while you are resting, after having something to eat and drink. How do I count fetal movements? 1. Find a quiet, comfortable area. Sit, or lie down on your side. 2. Write down the date, the start time and stop time, and the number of movements that you felt between those two times. Take this information with you to your health care visits. 3. For 2 hours, count kicks, flutters, swishes, rolls, and jabs. You should feel at least 10 movements during 2 hours. 4. You may stop counting after you have felt 10 movements. 5. If you do not feel 10 movements in 2 hours, have something to eat and drink. Then, keep resting and counting for 1 hour. If you feel at least 4 movements during that hour, you may stop counting. Contact a health care provider if:  You feel fewer than 4 movements in 2  hours.  Your baby is not moving like he or she usually does. Date: ____________ Start time: ____________ Stop time: ____________ Movements: ____________ Date: ____________ Start time: ____________ Stop time: ____________ Movements: ____________ Date: ____________ Start time: ____________ Stop time: ____________ Movements: ____________ Date: ____________ Start time: ____________ Stop time: ____________ Movements: ____________ Date: ____________ Start time: ____________ Stop time: ____________ Movements: ____________ Date: ____________ Start time: ____________ Stop time: ____________ Movements: ____________ Date: ____________ Start time: ____________ Stop time: ____________ Movements: ____________ Date: ____________ Start time: ____________ Stop time: ____________ Movements: ____________ Date: ____________ Start time: ____________ Stop time: ____________ Movements: ____________ This information is not intended to replace advice given to you by your health care provider. Make sure you discuss any questions you have with your health care provider. Document Released: 10/26/2006 Document Revised: 05/25/2016 Document Reviewed: 11/05/2015 Elsevier Interactive Patient Education  2017 [employer]

## 2016-10-13 NOTE — MAU Provider Note (Signed)
  History     CSN: [id number]  Arrival date and time: 10/13/16 0144   None     CC: decreased fetal movement  HPI  Patient is a 26 y.o. G3P1011 at 4282w6d who presents with decreased fetal movement over the last 1-2 hours. She feels as though the baby is not moving as much as she usually does. She denies any abdominal pain, contractions, nausea, vomiting, diarrhea, LOF, vaginal bleeding, or dysuria. Denies any trauma.   OB History    Gravida Para Term Preterm AB Living   3 1 1   1 1    SAB TAB Ectopic Multiple Live Births   1       1      Past Medical History:  Diagnosis Date  . Asthma     Past Surgical History:  Procedure Laterality Date  . WISDOM TOOTH EXTRACTION      Family History  Problem Relation Age of Onset  . Thyroid disease Mother   . COPD Father   . Cancer Maternal Grandfather   . COPD Paternal Grandmother     Social History  Substance Use Topics  . Smoking status: Former Smoker    Packs/day: 1.00    Quit date: 10/19/2014  . Smokeless tobacco: Never Used  . Alcohol use No     Comment: 1 beer/month    Allergies: No Known Allergies  No prescriptions prior to admission.    Review of Systems  Constitutional: Negative for chills and fever.  HENT: Negative for sore throat.   Eyes: Negative for blurred vision.  Respiratory: Negative for shortness of breath and wheezing.   Cardiovascular: Negative for chest pain, palpitations and leg swelling.  Gastrointestinal: Negative for abdominal pain, constipation, diarrhea, heartburn, nausea and vomiting.  Genitourinary: Negative for dysuria.  Musculoskeletal: Negative for myalgias.  Skin: Negative for rash.  Neurological: Negative for headaches.  :  Physical Exam   Blood pressure 117/68, pulse 71, temperature 98 F (36.7 C), temperature source Oral, resp. rate 18, height 5\' 7"  (1.702 m), weight 87.5 kg (193 lb), last menstrual period 03/04/2016, SpO2 98 %, unknown if currently breastfeeding.  Physical Exam   Constitutional: She is oriented to person, place, and time. She appears well-developed and well-nourished.  HENT:  Head: Normocephalic and atraumatic.  Cardiovascular: Normal rate and regular rhythm.   Respiratory: Effort normal. No respiratory distress.  GI: Soft. She exhibits distension (gravid abdomen). There is no tenderness.  Musculoskeletal: She exhibits no edema.  Neurological: She is alert and oriented to person, place, and time.  Skin: Skin is warm and dry.  Psychiatric: She has a normal mood and affect.    MAU Course  Procedures  MDM Patient placed on monitor for >30 minutes. FHT normal with 125 bpm, moderate variability, and no decelerations. No contractions were noted.  UA collected  Assessment and Plan  Decreased Fetal Movement per patient report: Normal EFM. UA dirty catch but no signs of infection. No signs of PROM or preterm labor. Discussed kick counts and return precautions. Patient stable for discharge.   Amy Little 10/13/2016, 10:28 AM   I have participated in the care of this patient and I agree with the above. Cam HaiSHAW, Amy Little CNM 12:14 AM 10/15/2016

## 2016-10-13 NOTE — MAU Note (Signed)
Pt reports decreased fetal movement tonight. Some movement but it is very different

## 2016-10-25 ENCOUNTER — Ambulatory Visit (INDEPENDENT_AMBULATORY_CARE_PROVIDER_SITE_OTHER): Payer: Medicaid Other | Admitting: Certified Nurse Midwife

## 2016-10-25 VITALS — BP 126/67 | HR 81 | Wt 193.0 lb

## 2016-10-25 DIAGNOSIS — Z3483 Encounter for supervision of other normal pregnancy, third trimester: Secondary | ICD-10-CM

## 2016-10-25 DIAGNOSIS — R87610 Atypical squamous cells of undetermined significance on cytologic smear of cervix (ASC-US): Secondary | ICD-10-CM

## 2016-10-25 DIAGNOSIS — Z8619 Personal history of other infectious and parasitic diseases: Secondary | ICD-10-CM

## 2016-10-25 DIAGNOSIS — J45909 Unspecified asthma, uncomplicated: Secondary | ICD-10-CM | POA: Insufficient documentation

## 2016-10-25 DIAGNOSIS — J4531 Mild persistent asthma with (acute) exacerbation: Secondary | ICD-10-CM

## 2016-10-25 DIAGNOSIS — R87810 Cervical high risk human papillomavirus (HPV) DNA test positive: Secondary | ICD-10-CM

## 2016-10-25 MED ORDER — BECLOMETHASONE DIPROPIONATE 40 MCG/ACT IN AERS: 2.0000 | INHALATION_SPRAY | Freq: Two times a day (BID) | RESPIRATORY_TRACT | 12 refills | Status: DC

## 2016-10-25 MED ORDER — VALACYCLOVIR HCL 500 MG PO TABS: 500.0000 mg | ORAL_TABLET | Freq: Every day | ORAL | 1 refills | Status: DC

## 2016-10-25 NOTE — Progress Notes (Signed)
Patient states her asthma has gotten worse during the pregnancy. She is using her inhaler a lot more than in the past. Patient reports she has had more abdominal pain in the last few weeks. Patient reports changes in her vision- she has been to MAU for this.

## 2016-10-25 NOTE — Progress Notes (Signed)
Patient has question about when to start her antiviral.

## 2016-10-25 NOTE — Progress Notes (Signed)
   PRENATAL VISIT NOTE  Subjective:  Amy Little is a 26 y.o. G3P1011 at 8610w4d being seen today for ongoing prenatal care.  She is currently monitored for the following issues for this low-risk pregnancy and has Supervision of normal pregnancy; ASCUS with positive high risk HPV cervical pap smear; Asthma; and History of herpes labialis on her problem list.  Patient reports no bleeding, no contractions, no cramping, no leaking and reports using inhailer 12 times a day, reports SOB with activity.  Contractions: Irregular. Vag. Bleeding: None.  Movement: Present. Denies leaking of fluid.   The following portions of the patient's history were reviewed and updated as appropriate: allergies, current medications, past family history, past medical history, past social history, past surgical history and problem list. Problem list updated.  Objective:   Vitals:   10/25/16 1536  BP: 126/67  Pulse: 81  Weight: 193 lb (87.5 kg)    Fetal Status: Fetal Heart Rate (bpm): 147 Fundal Height: 33 cm Movement: Present     General:  Alert, oriented and cooperative. Patient is in no acute distress.  Skin: Skin is warm and dry. No rash noted.   Cardiovascular: Normal heart rate noted  Respiratory: Normal respiratory effort, no problems with respiration noted  Abdomen: Soft, gravid, appropriate for gestational age. Pain/Pressure: Absent     Pelvic:  Cervical exam deferred        Extremities: Normal range of motion.  Edema: None  Mental Status: Normal mood and affect. Normal behavior. Normal judgment and thought content.   Assessment and Plan:  Pregnancy: G3P1011 at 210w4d  1. Encounter for supervision of other normal pregnancy in third trimester     Exacerbation of asthma, uncontrolled.   2. 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 Mild persistent asthma with acute exacerbation    - beclomethasone (QVAR) 40 MCG/ACT inhaler; Inhale 2 puffs into the lungs 2 (two)  times daily.  Dispense: 1 Inhaler; Refill: 12 - Ambulatory referral to Pulmonology   Discussed POC with Dr. Debroah LoopArnold  4. History of herpes labialis     Educated to start at 36 weeks.   - valACYclovir (VALTREX) 500 MG tablet; Take 1 tablet (500 mg total) by mouth daily.  Dispense: 30 tablet; Refill: 1  Preterm labor symptoms and general obstetric precautions including but not limited to vaginal bleeding, contractions, leaking of fluid and fetal movement were reviewed in detail with the patient. Please refer to After Visit Summary for other counseling recommendations.  Return in about 2 weeks (around 11/08/2016) for ROB/GBS.   Amy Little, CNM

## 2016-11-03 ENCOUNTER — Inpatient Hospital Stay (HOSPITAL_COMMUNITY)
Admission: AD | Admit: 2016-11-03 | Discharge: 2016-11-03 | Disposition: A | Discharge status: Home / Self Care | Payer: Medicaid Other | Source: Ambulatory Visit | Attending: Obstetrics and Gynecology | Admitting: Obstetrics and Gynecology

## 2016-11-03 ENCOUNTER — Encounter (HOSPITAL_COMMUNITY): Payer: Self-pay

## 2016-11-03 DIAGNOSIS — Z3A34 34 weeks gestation of pregnancy: Secondary | ICD-10-CM | POA: Insufficient documentation

## 2016-11-03 DIAGNOSIS — Z87891 Personal history of nicotine dependence: Secondary | ICD-10-CM | POA: Diagnosis not present

## 2016-11-03 DIAGNOSIS — R102 Pelvic and perineal pain: Secondary | ICD-10-CM | POA: Diagnosis present

## 2016-11-03 DIAGNOSIS — O99513 Diseases of the respiratory system complicating pregnancy, third trimester: Secondary | ICD-10-CM | POA: Insufficient documentation

## 2016-11-03 DIAGNOSIS — O26893 Other specified pregnancy related conditions, third trimester: Secondary | ICD-10-CM | POA: Diagnosis not present

## 2016-11-03 DIAGNOSIS — J45909 Unspecified asthma, uncomplicated: Secondary | ICD-10-CM | POA: Diagnosis not present

## 2016-11-03 DIAGNOSIS — Z3483 Encounter for supervision of other normal pregnancy, third trimester: Secondary | ICD-10-CM

## 2016-11-03 DIAGNOSIS — B001 Herpesviral vesicular dermatitis: Secondary | ICD-10-CM | POA: Insufficient documentation

## 2016-11-03 DIAGNOSIS — O98513 Other viral diseases complicating pregnancy, third trimester: Secondary | ICD-10-CM | POA: Insufficient documentation

## 2016-11-03 DIAGNOSIS — N898 Other specified noninflammatory disorders of vagina: Secondary | ICD-10-CM | POA: Diagnosis not present

## 2016-11-03 DIAGNOSIS — R87610 Atypical squamous cells of undetermined significance on cytologic smear of cervix (ASC-US): Secondary | ICD-10-CM

## 2016-11-03 DIAGNOSIS — R87810 Cervical high risk human papillomavirus (HPV) DNA test positive: Secondary | ICD-10-CM

## 2016-11-03 LAB — URINALYSIS, ROUTINE W REFLEX MICROSCOPIC
Bilirubin Urine: NEGATIVE
Glucose, UA: NEGATIVE mg/dL
Hgb urine dipstick: NEGATIVE
KETONES UR: NEGATIVE mg/dL
Nitrite: NEGATIVE
PH: 7 (ref 5.0–8.0)
PROTEIN: NEGATIVE mg/dL
Specific Gravity, Urine: 1.004 — ABNORMAL LOW (ref 1.005–1.030)

## 2016-11-03 LAB — WET PREP, GENITAL
CLUE CELLS WET PREP: NONE SEEN
Sperm: NONE SEEN
TRICH WET PREP: NONE SEEN
Yeast Wet Prep HPF POC: NONE SEEN

## 2016-11-03 LAB — OB RESULTS CONSOLE GC/CHLAMYDIA: GC PROBE AMP, GENITAL: NEGATIVE

## 2016-11-03 MED ORDER — MICONAZOLE NITRATE 1200 & 2 MG & % VA KIT: 1.0000 | PACK | Freq: Once | VAGINAL | 0 refills | Status: AC

## 2016-11-03 NOTE — MAU Note (Signed)
Pt stated she has been having discomfort in her vaginal aea. Feels like it is swollen and irritated. Increased pelvic pressure as well.

## 2016-11-03 NOTE — Discharge Instructions (Signed)

## 2016-11-03 NOTE — MAU Provider Note (Signed)
Obstetric Resident MAU Note  Chief Complaint:  Vaginal Pain   None    HPI: Amy Little is a 26 y.o. G3P1011 at 53w6dwho presents to maternity admissions reporting vaginal pressure, pain, and sensation of swelling. She reports that this discomfort has been present over the past two days. She denies any increased vaginal discharge. A a small amount of pruritus. She does have a history of HSV and reports her last outbreak was one month ago. Her prodromal symptoms are typically flu like symptoms. She reports that this discomfort is not consistent with her typical outbreaks. No dysuria, urinary urgency, frequency other than typical throughout pregnancy. No lower back pain. No nausea or vomiting.   Denies contractions, leakage of fluid or vaginal bleeding. Good fetal movement.   [facility]   Diagnosis Date Noted  . Asthma 10/25/2016  . History of herpes labialis 10/25/2016  . 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 Supervision of normal pregnancy 04/27/2016    Past Medical History:  Diagnosis Date  . Asthma     OB History  Gravida Para Term Preterm AB Living  '3 1 1   1 1  ' SAB TAB Ectopic Multiple Live Births  1       1    # Outcome Date GA Lbr Len/2nd Weight Sex Delivery Anes PTL Lv  3 Current           2 Term 2012    M Vag-Spont EPI N LIV  1 SAB               Past Surgical History:  Procedure Laterality Date  . WISDOM TOOTH EXTRACTION      Family History: Family History  Problem Relation Age of Onset  . Thyroid disease Mother   . COPD Father   . Cancer Maternal Grandfather   . COPD Paternal Grandmother     Social History: Social History  Substance Use Topics  . Smoking status: Former Smoker    Packs/day: 1.00    Quit date: 10/19/2014  . Smokeless tobacco: Never Used  . Alcohol use No     Comment: 1 beer/month    Allergies: No Known Allergies  Prescriptions Prior to  Admission  Medication Sig Dispense Refill Last Dose  . acetaminophen (TYLENOL) 500 MG tablet Take 500 mg by mouth every 6 (six) hours as needed for headache.    Past Month at Unknown time  . albuterol (PROVENTIL HFA;VENTOLIN HFA) 108 (90 Base) MCG/ACT inhaler Inhale 2 puffs into the lungs every 6 (six) hours as needed for wheezing or shortness of breath.   11/03/2016 at Unknown time  . Prenatal Vit-Fe Fumarate-FA (PRENATAL MULTIVITAMIN) TABS tablet Take 1 tablet by mouth at bedtime.    11/02/2016 at Unknown time  . beclomethasone (QVAR) 40 MCG/ACT inhaler Inhale 2 puffs into the lungs 2 (two) times daily. (Patient not taking: Reported on 11/03/2016) 1 Inhaler 12 Not Taking at Unknown time  . valACYclovir (VALTREX) 500 MG tablet Take 1 tablet (500 mg total) by mouth daily. (Patient not taking: Reported on 11/03/2016) 30 tablet 1 Not Taking at Unknown time    ROS: Pertinent findings in history of present illness.  Physical Exam  Blood pressure 120/68, pulse 69, temperature 98 F (36.7 C), temperature source Oral, resp. rate 16, height '5\' 7"'  (1.702 m), weight 195 lb 1.9 oz (88.5 kg), last menstrual period 03/04/2016, SpO2 100 %, unknown if currently breastfeeding.  CONSTITUTIONAL: Well-developed, well-nourished female in no acute distress.  HENT:  Normocephalic, atraumatic, Moist mucus membranes EYES: Conjunctivae normal in appearance. No scleral icterus.  NECK: Normal range of motion, supple SKIN: Skin is warm and dry. No rash noted. Not diaphoretic. No erythema. No pallor. Helen: Alert and oriented to person, place, and time. No focal defects. PSYCHIATRIC: Normal mood and affect. Normal behavior. Normal judgment and thought content. CARDIOVASCULAR: Normal heart rate noted, regular rhythm RESPIRATORY: No increased work of breathing, stable on room air, LCTAB ABDOMEN: Soft, nontender, nondistended, gravid appropriate for gestational age MUSCULOSKELETAL: Normal range of motion. No edema and no  tenderness. 2+ distal pulses.  SPECULUM EXAM: NEFG without any visible lesions, physiologic discharge that is thin and cream colored, no blood, cervix clean. Erythema noted along vaginal mucosa consistent with irritation.    FHT:  Baseline 150s , moderate variability, accelerations present, no decelerations Contractions: no regular uterine contractions, uterine irritability only   Labs: Results for orders placed or performed during the hospital encounter of 11/03/16 (from the past 24 hour(s))  Urinalysis, Routine w reflex microscopic     Status: Abnormal   Collection Time: 11/03/16  9:30 AM  Result Value Ref Range   Color, Urine STRAW (A) YELLOW   APPearance CLEAR CLEAR   Specific Gravity, Urine 1.004 (L) 1.005 - 1.030   pH 7.0 5.0 - 8.0   Glucose, UA NEGATIVE NEGATIVE mg/dL   Hgb urine dipstick NEGATIVE NEGATIVE   Bilirubin Urine NEGATIVE NEGATIVE   Ketones, ur NEGATIVE NEGATIVE mg/dL   Protein, ur NEGATIVE NEGATIVE mg/dL   Nitrite NEGATIVE NEGATIVE   Leukocytes, UA SMALL (A) NEGATIVE   RBC / HPF 0-5 0 - 5 RBC/hpf   WBC, UA 0-5 0 - 5 WBC/hpf   Bacteria, UA RARE (A) NONE SEEN   Squamous Epithelial / LPF 0-5 (A) NONE SEEN  Wet prep, genital     Status: Abnormal   Collection Time: 11/03/16 10:33 AM  Result Value Ref Range   Yeast Wet Prep HPF POC NONE SEEN NONE SEEN   Trich, Wet Prep NONE SEEN NONE SEEN   Clue Cells Wet Prep HPF POC NONE SEEN NONE SEEN   WBC, Wet Prep HPF POC MODERATE (A) NONE SEEN   Sperm NONE SEEN     Imaging:  No results found.  MAU Course: Patient presented with vaginal discomfort and pressure as well as swelling. Performed a speculum exam that revealed a visually closed cervix. A small amount of thin, cream colored discharge was present. Wet prep and GC/Chlamydia were obtained. Wet prep only notable for WBCs. FHT was initially showing quite a bit of variability, with difficulty determining a baseline. Continuous monitoring was performed to better assess  FHT.   Assessment: 1. Vaginal irritation   2. ASCUS with positive high risk HPV cervical   3. Encounter for supervision of other normal pregnancy in third trimester     Plan: Discharge home Provided with Rx for monistat due to vaginal irritation Labor precautions  reviewed Follow up with OB provider  Follow-up [facility] Follow up.   Specialty:  Obstetrics and Gynecology Why:  Folow-up as previously scheduled on 11/14/16 or sooner if needed Contact information: [address], [place] [phone]          Allergies as of 11/03/2016   No Known Allergies     Medication List    TAKE these medications   acetaminophen 500 MG  tablet Commonly known as:  TYLENOL Take 500 mg by mouth every 6 (six) hours as needed for headache.   albuterol 108 (90 Base) MCG/ACT inhaler Commonly known as:  PROVENTIL HFA;VENTOLIN HFA Inhale 2 puffs into the lungs every 6 (six) hours as needed for wheezing or shortness of breath.   beclomethasone 40 MCG/ACT inhaler Commonly known as:  QVAR Inhale 2 puffs into the lungs 2 (two) times daily.   miconazole kit Commonly known as:  MONISTAT 1 COMBO PACK Place 1 each vaginally once.   prenatal multivitamin Tabs tablet Take 1 tablet by mouth at bedtime.   valACYclovir 500 MG tablet Commonly known as:  VALTREX Take 1 tablet (500 mg total) by mouth daily.       Loann Quill, MD PGY-2 11/03/2016 11:39 AM

## 2016-11-04 LAB — GC/CHLAMYDIA PROBE AMP (~~LOC~~) NOT AT ARMC
CHLAMYDIA, DNA PROBE: NEGATIVE
NEISSERIA GONORRHEA: NEGATIVE

## 2016-11-14 ENCOUNTER — Ambulatory Visit (INDEPENDENT_AMBULATORY_CARE_PROVIDER_SITE_OTHER): Payer: Medicaid Other | Admitting: Obstetrics and Gynecology

## 2016-11-14 VITALS — BP 127/76 | HR 90 | Temp 97.3°F | Wt 201.3 lb

## 2016-11-14 DIAGNOSIS — R87610 Atypical squamous cells of undetermined significance on cytologic smear of cervix (ASC-US): Secondary | ICD-10-CM

## 2016-11-14 DIAGNOSIS — Z3483 Encounter for supervision of other normal pregnancy, third trimester: Secondary | ICD-10-CM

## 2016-11-14 DIAGNOSIS — R87810 Cervical high risk human papillomavirus (HPV) DNA test positive: Secondary | ICD-10-CM

## 2016-11-14 NOTE — Progress Notes (Signed)
   PRENATAL VISIT NOTE  Subjective:  Amy Little is a 26 y.o. G3P1011 at 3467w3d being seen today for ongoing prenatal care.  She is currently monitored for the following issues for this low-risk pregnancy and has Supervision of normal pregnancy; ASCUS with positive high risk HPV cervical pap smear; Asthma; and History of herpes labialis on her problem list.  Patient reports no complaints.  Contractions: Not present. Vag. Bleeding: None.  Movement: Present. Denies leaking of fluid.   The following portions of the patient's history were reviewed and updated as appropriate: allergies, current medications, past family history, past medical history, past social history, past surgical history and problem list. Problem list updated.  Objective:   Vitals:   11/14/16 1555  BP: 127/76  Pulse: 90  Temp: 97.3 F (36.3 C)  Weight: 201 lb 4.8 oz (91.3 kg)    Fetal Status: Fetal Heart Rate (bpm): 130 Fundal Height: 36 cm Movement: Present  Presentation: Vertex  General:  Alert, oriented and cooperative. Patient is in no acute distress.  Skin: Skin is warm and dry. No rash noted.   Cardiovascular: Normal heart rate noted  Respiratory: Normal respiratory effort, no problems with respiration noted  Abdomen: Soft, gravid, appropriate for gestational age. Pain/Pressure: Absent     Pelvic:  Cervical exam performed Dilation: 1.5 Effacement (%): 50 Station: -3  Extremities: Normal range of motion.  Edema: None  Mental Status: Normal mood and affect. Normal behavior. Normal judgment and thought content.   Assessment and Plan:  Pregnancy: G3P1011 at 6667w3d  1. Encounter for supervision of other normal pregnancy in third trimester Patient is doing well Neg GC/cl on 1/25 GBS today - Strep Gp B NAA  2. ASCUS with positive high risk HPV cervical pap smear Will need colpo postpartum  Preterm labor symptoms and general obstetric precautions including but not limited to vaginal bleeding, contractions,  leaking of fluid and fetal movement were reviewed in detail with the patient. Please refer to After Visit Summary for other counseling recommendations.  Return in about 1 week (around 11/21/2016) for ROB.   Catalina AntiguaPeggy Maruice Pieroni, MD

## 2016-11-14 NOTE — Progress Notes (Signed)
Patient is in the office, reports good fetal movement, denies contractions.

## 2016-11-16 ENCOUNTER — Encounter: Payer: Self-pay | Admitting: Obstetrics and Gynecology

## 2016-11-16 DIAGNOSIS — O99820 Streptococcus B carrier state complicating pregnancy: Secondary | ICD-10-CM | POA: Insufficient documentation

## 2016-11-16 LAB — STREP GP B NAA: STREP GROUP B AG: POSITIVE — AB

## 2016-11-21 ENCOUNTER — Ambulatory Visit (INDEPENDENT_AMBULATORY_CARE_PROVIDER_SITE_OTHER): Payer: Medicaid Other | Admitting: Obstetrics and Gynecology

## 2016-11-21 VITALS — BP 119/77 | HR 80 | Wt 194.8 lb

## 2016-11-21 DIAGNOSIS — Z3483 Encounter for supervision of other normal pregnancy, third trimester: Secondary | ICD-10-CM

## 2016-11-21 DIAGNOSIS — R87610 Atypical squamous cells of undetermined significance on cytologic smear of cervix (ASC-US): Secondary | ICD-10-CM

## 2016-11-21 DIAGNOSIS — R87810 Cervical high risk human papillomavirus (HPV) DNA test positive: Secondary | ICD-10-CM

## 2016-11-21 DIAGNOSIS — Z8619 Personal history of other infectious and parasitic diseases: Secondary | ICD-10-CM

## 2016-11-21 DIAGNOSIS — O99820 Streptococcus B carrier state complicating pregnancy: Secondary | ICD-10-CM

## 2016-11-21 NOTE — Progress Notes (Signed)
   PRENATAL VISIT NOTE  Subjective:  Amy Little is a 26 y.o. G3P1011 at 7083w3d being seen today for ongoing prenatal care.  She is currently monitored for the following issues for this low-risk pregnancy and has Supervision of normal pregnancy; ASCUS with positive high risk HPV cervical pap smear; Asthma; History of herpes labialis; and GBS (group B Streptococcus carrier), +RV culture, currently pregnant on her problem list.  Patient reports no complaints.  Contractions: Irregular. Vag. Bleeding: None.  Movement: Present. Denies leaking of fluid.   The following portions of the patient's history were reviewed and updated as appropriate: allergies, current medications, past family history, past medical history, past social history, past surgical history and problem list. Problem list updated.  Objective:   Vitals:   11/21/16 0925  BP: 119/77  Pulse: 80  Weight: 194 lb 12.8 oz (88.4 kg)    Fetal Status: Fetal Heart Rate (bpm): 148 Fundal Height: 37 cm Movement: Present  Presentation: Vertex  General:  Alert, oriented and cooperative. Patient is in no acute distress.  Skin: Skin is warm and dry. No rash noted.   Cardiovascular: Normal heart rate noted  Respiratory: Normal respiratory effort, no problems with respiration noted  Abdomen: Soft, gravid, appropriate for gestational age. Pain/Pressure: Present     Pelvic:  Cervical exam performed Dilation: 1.5 Effacement (%): 50 Station: -3  Extremities: Normal range of motion.  Edema: Trace  Mental Status: Normal mood and affect. Normal behavior. Normal judgment and thought content.   Assessment and Plan:  Pregnancy: G3P1011 at 583w3d  1. GBS (group B Streptococcus carrier), +RV culture, currently pregnant Patient informed of GBS positive Will provide prophylaxis in labor  2. Encounter for supervision of other normal pregnancy in third trimester Patient is doing well without complaints  3. History of herpes labialis Continue Valtrex  for prophylaxis  4. ASCUS with positive high risk HPV cervical pap smear colpo pp  Term labor symptoms and general obstetric precautions including but not limited to vaginal bleeding, contractions, leaking of fluid and fetal movement were reviewed in detail with the patient. Please refer to After Visit Summary for other counseling recommendations.  Return in about 1 week (around 11/28/2016) for ROB.   Catalina AntiguaPeggy Mariko Nowakowski, MD

## 2016-11-29 ENCOUNTER — Ambulatory Visit: Payer: Medicaid Other | Admitting: Obstetrics and Gynecology

## 2016-11-29 VITALS — BP 120/79 | HR 96 | Wt 198.9 lb

## 2016-11-29 DIAGNOSIS — R87610 Atypical squamous cells of undetermined significance on cytologic smear of cervix (ASC-US): Secondary | ICD-10-CM

## 2016-11-29 DIAGNOSIS — Z3483 Encounter for supervision of other normal pregnancy, third trimester: Secondary | ICD-10-CM

## 2016-11-29 DIAGNOSIS — J4520 Mild intermittent asthma, uncomplicated: Secondary | ICD-10-CM

## 2016-11-29 DIAGNOSIS — O99820 Streptococcus B carrier state complicating pregnancy: Secondary | ICD-10-CM

## 2016-11-29 DIAGNOSIS — R87810 Cervical high risk human papillomavirus (HPV) DNA test positive: Secondary | ICD-10-CM

## 2016-11-29 DIAGNOSIS — Z8619 Personal history of other infectious and parasitic diseases: Secondary | ICD-10-CM

## 2016-11-29 NOTE — Progress Notes (Signed)
Prenatal Visit Note Date: 11/29/2016 Clinic: [facility]  Subjective:  Amy Little is a 26 y.o. G3P1011 at 313w4d being seen today for ongoing prenatal care.  She is currently monitored for the following issues for this low-risk pregnancy and has Supervision of normal pregnancy; ASCUS with positive high risk HPV cervical pap smear; Asthma; History of herpes labialis; and GBS (group B Streptococcus carrier), +RV culture, currently pregnant on her problem list.  Patient reports no complaints.   Contractions: Not present. Vag. Bleeding: None.  Movement: Present. Denies leaking of fluid.   The following portions of the patient's history were reviewed and updated as appropriate: allergies, current medications, past family history, past medical history, past social history, past surgical history and problem list. Problem list updated.  Objective:   Vitals:   11/29/16 1342  BP: 120/79  Pulse: 96  Weight: 198 lb 14.4 oz (90.2 kg)    Fetal Status: Fetal Heart Rate (bpm): 139 Fundal Height: 38 cm Movement: Present  Presentation: Vertex  General:  Alert, oriented and cooperative. Patient is in no acute distress.  Skin: Skin is warm and dry. No rash noted.   Cardiovascular: Normal heart rate noted  Respiratory: Normal respiratory effort, no problems with respiration noted  Abdomen: Soft, gravid, appropriate for gestational age. Pain/Pressure: Absent     Pelvic:  Cervical exam deferred        Extremities: Normal range of motion.  Edema: Trace  Mental Status: Normal mood and affect. Normal behavior. Normal judgment and thought content.   Urinalysis:      Assessment and Plan:  Pregnancy: G3P1011 at 673w4d  1. Encounter for supervision of other normal pregnancy in third trimester Routine care. Set up PDIOL nv.  2. History of herpes labialis Continue valtrex ppx  3. GBS (group B Streptococcus carrier), +RV culture, currently pregnant tx in labor  4. ASCUS with  positive high risk HPV cervical pap smear colpo PP  5. Mild intermittent asthma without complication No meds, no issues  Term labor symptoms and general obstetric precautions including but not limited to vaginal bleeding, contractions, leaking of fluid and fetal movement were reviewed in detail with the patient. Please refer to After Visit Summary for other counseling recommendations.  Return in about 1 week (around 12/06/2016) for rob.   [place] Bingharlie Major [place], MD

## 2016-11-30 ENCOUNTER — Encounter (HOSPITAL_COMMUNITY): Payer: Self-pay | Admitting: *Deleted

## 2016-11-30 ENCOUNTER — Inpatient Hospital Stay (HOSPITAL_COMMUNITY): Payer: Medicaid Other | Admitting: Anesthesiology

## 2016-11-30 ENCOUNTER — Inpatient Hospital Stay (HOSPITAL_COMMUNITY)
Admission: AD | Admit: 2016-11-30 | Discharge: 2016-12-02 | DRG: 774 | Disposition: A | Discharge status: Home / Self Care | Payer: Medicaid Other | Source: Ambulatory Visit | Attending: Family Medicine | Admitting: Family Medicine

## 2016-11-30 DIAGNOSIS — O9952 Diseases of the respiratory system complicating childbirth: Secondary | ICD-10-CM | POA: Diagnosis present

## 2016-11-30 DIAGNOSIS — O99824 Streptococcus B carrier state complicating childbirth: Secondary | ICD-10-CM | POA: Diagnosis present

## 2016-11-30 DIAGNOSIS — Z3483 Encounter for supervision of other normal pregnancy, third trimester: Secondary | ICD-10-CM

## 2016-11-30 DIAGNOSIS — Z87891 Personal history of nicotine dependence: Secondary | ICD-10-CM

## 2016-11-30 DIAGNOSIS — Z3A38 38 weeks gestation of pregnancy: Secondary | ICD-10-CM

## 2016-11-30 DIAGNOSIS — O9832 Other infections with a predominantly sexual mode of transmission complicating childbirth: Secondary | ICD-10-CM | POA: Diagnosis present

## 2016-11-30 DIAGNOSIS — J45909 Unspecified asthma, uncomplicated: Secondary | ICD-10-CM | POA: Diagnosis present

## 2016-11-30 DIAGNOSIS — Z3493 Encounter for supervision of normal pregnancy, unspecified, third trimester: Secondary | ICD-10-CM | POA: Diagnosis present

## 2016-11-30 DIAGNOSIS — R87810 Cervical high risk human papillomavirus (HPV) DNA test positive: Secondary | ICD-10-CM

## 2016-11-30 DIAGNOSIS — A6000 Herpesviral infection of urogenital system, unspecified: Secondary | ICD-10-CM | POA: Diagnosis present

## 2016-11-30 DIAGNOSIS — R87610 Atypical squamous cells of undetermined significance on cytologic smear of cervix (ASC-US): Secondary | ICD-10-CM

## 2016-11-30 DIAGNOSIS — O99820 Streptococcus B carrier state complicating pregnancy: Secondary | ICD-10-CM

## 2016-11-30 LAB — CBC
HEMATOCRIT: 33.7 % — AB (ref 36.0–46.0)
Hemoglobin: 11.5 g/dL — ABNORMAL LOW (ref 12.0–15.0)
MCH: 30.3 pg (ref 26.0–34.0)
MCHC: 34.1 g/dL (ref 30.0–36.0)
MCV: 88.7 fL (ref 78.0–100.0)
Platelets: 258 10*3/uL (ref 150–400)
RBC: 3.8 MIL/uL — AB (ref 3.87–5.11)
RDW: 14.1 % (ref 11.5–15.5)
WBC: 12.6 10*3/uL — AB (ref 4.0–10.5)

## 2016-11-30 LAB — TYPE AND SCREEN
ABO/RH(D): A POS
Antibody Screen: NEGATIVE

## 2016-11-30 MED ORDER — SENNOSIDES-DOCUSATE SODIUM 8.6-50 MG PO TABS
2.0000 | ORAL_TABLET | ORAL | Status: DC
  Administered 2016-12-01 – 2016-12-02 (×2): 2 via ORAL
  Filled 2016-11-30 (×2): qty 2

## 2016-11-30 MED ORDER — ONDANSETRON HCL 4 MG PO TABS: 4.0000 mg | ORAL_TABLET | ORAL | Status: DC | PRN

## 2016-11-30 MED ORDER — PRENATAL MULTIVITAMIN CH
1.0000 | ORAL_TABLET | Freq: Every day | ORAL | Status: DC
  Administered 2016-11-30 – 2016-12-01 (×2): 1 via ORAL
  Filled 2016-11-30 (×2): qty 1

## 2016-11-30 MED ORDER — PENICILLIN G POT IN DEXTROSE 60000 UNIT/ML IV SOLN
3.0000 10*6.[IU] | INTRAVENOUS | Status: DC
  Filled 2016-11-30 (×2): qty 50

## 2016-11-30 MED ORDER — OXYTOCIN BOLUS FROM INFUSION
500.0000 mL | Freq: Once | INTRAVENOUS | Status: AC
  Administered 2016-11-30: 500 mL via INTRAVENOUS

## 2016-11-30 MED ORDER — OXYTOCIN 40 UNITS IN LACTATED RINGERS INFUSION - SIMPLE MED
2.5000 [IU]/h | INTRAVENOUS | Status: DC
  Filled 2016-11-30: qty 1000

## 2016-11-30 MED ORDER — FENTANYL 2.5 MCG/ML BUPIVACAINE 1/10 % EPIDURAL INFUSION (WH - ANES): 14.0000 mL/h | INTRAMUSCULAR | Status: DC | PRN

## 2016-11-30 MED ORDER — LIDOCAINE HCL (PF) 1 % IJ SOLN
30.0000 mL | INTRAMUSCULAR | Status: AC | PRN
  Administered 2016-11-30: 30 mL via SUBCUTANEOUS
  Filled 2016-11-30: qty 30

## 2016-11-30 MED ORDER — PHENYLEPHRINE 40 MCG/ML (10ML) SYRINGE FOR IV PUSH (FOR BLOOD PRESSURE SUPPORT)
80.0000 ug | PREFILLED_SYRINGE | INTRAVENOUS | Status: DC | PRN
  Filled 2016-11-30: qty 5

## 2016-11-30 MED ORDER — OXYCODONE-ACETAMINOPHEN 5-325 MG PO TABS: 1.0000 | ORAL_TABLET | ORAL | Status: DC | PRN

## 2016-11-30 MED ORDER — DIBUCAINE 1 % RE OINT: 1.0000 "application " | TOPICAL_OINTMENT | RECTAL | Status: DC | PRN

## 2016-11-30 MED ORDER — FENTANYL 2.5 MCG/ML BUPIVACAINE 1/10 % EPIDURAL INFUSION (WH - ANES)
INTRAMUSCULAR | Status: AC
  Filled 2016-11-30: qty 100

## 2016-11-30 MED ORDER — PHENYLEPHRINE 40 MCG/ML (10ML) SYRINGE FOR IV PUSH (FOR BLOOD PRESSURE SUPPORT): 80.0000 ug | PREFILLED_SYRINGE | INTRAVENOUS | Status: DC | PRN

## 2016-11-30 MED ORDER — ZOLPIDEM TARTRATE 5 MG PO TABS: 5.0000 mg | ORAL_TABLET | Freq: Every evening | ORAL | Status: DC | PRN

## 2016-11-30 MED ORDER — ONDANSETRON HCL 4 MG/2ML IJ SOLN: 4.0000 mg | INTRAMUSCULAR | Status: DC | PRN

## 2016-11-30 MED ORDER — TETANUS-DIPHTH-ACELL PERTUSSIS 5-2.5-18.5 LF-MCG/0.5 IM SUSP: 0.5000 mL | Freq: Once | INTRAMUSCULAR | Status: DC

## 2016-11-30 MED ORDER — PENICILLIN G POTASSIUM 5000000 UNITS IJ SOLR
5.0000 10*6.[IU] | Freq: Once | INTRAVENOUS | Status: AC
  Administered 2016-11-30: 5 10*6.[IU] via INTRAVENOUS
  Filled 2016-11-30: qty 5

## 2016-11-30 MED ORDER — SOD CITRATE-CITRIC ACID 500-334 MG/5ML PO SOLN: 30.0000 mL | ORAL | Status: DC | PRN

## 2016-11-30 MED ORDER — PHENYLEPHRINE 40 MCG/ML (10ML) SYRINGE FOR IV PUSH (FOR BLOOD PRESSURE SUPPORT)
PREFILLED_SYRINGE | INTRAVENOUS | Status: AC
  Filled 2016-11-30: qty 10

## 2016-11-30 MED ORDER — LACTATED RINGERS IV SOLN: 500.0000 mL | Freq: Once | INTRAVENOUS | Status: DC

## 2016-11-30 MED ORDER — ACETAMINOPHEN 325 MG PO TABS: 650.0000 mg | ORAL_TABLET | ORAL | Status: DC | PRN

## 2016-11-30 MED ORDER — FENTANYL 2.5 MCG/ML BUPIVACAINE 1/10 % EPIDURAL INFUSION (WH - ANES)
14.0000 mL/h | INTRAMUSCULAR | Status: DC | PRN
  Administered 2016-11-30: 14 mL/h via EPIDURAL

## 2016-11-30 MED ORDER — LIDOCAINE HCL (PF) 1 % IJ SOLN
INTRAMUSCULAR | Status: DC | PRN
  Administered 2016-11-30 (×2): 5 mL via EPIDURAL

## 2016-11-30 MED ORDER — EPHEDRINE 5 MG/ML INJ
10.0000 mg | INTRAVENOUS | Status: DC | PRN
  Filled 2016-11-30: qty 4

## 2016-11-30 MED ORDER — EPHEDRINE 5 MG/ML INJ: 10.0000 mg | INTRAVENOUS | Status: DC | PRN

## 2016-11-30 MED ORDER — WITCH HAZEL-GLYCERIN EX PADS: 1.0000 "application " | MEDICATED_PAD | CUTANEOUS | Status: DC | PRN

## 2016-11-30 MED ORDER — COCONUT OIL OIL: 1.0000 "application " | TOPICAL_OIL | Status: DC | PRN

## 2016-11-30 MED ORDER — DIPHENHYDRAMINE HCL 25 MG PO CAPS: 25.0000 mg | ORAL_CAPSULE | Freq: Four times a day (QID) | ORAL | Status: DC | PRN

## 2016-11-30 MED ORDER — IBUPROFEN 600 MG PO TABS
600.0000 mg | ORAL_TABLET | Freq: Four times a day (QID) | ORAL | Status: DC
  Administered 2016-11-30 – 2016-12-02 (×9): 600 mg via ORAL
  Filled 2016-11-30 (×9): qty 1

## 2016-11-30 MED ORDER — FENTANYL CITRATE (PF) 100 MCG/2ML IJ SOLN: 100.0000 ug | INTRAMUSCULAR | Status: DC | PRN

## 2016-11-30 MED ORDER — LACTATED RINGERS IV SOLN
INTRAVENOUS | Status: DC
  Administered 2016-11-30: 125 mL via INTRAVENOUS
  Administered 2016-11-30: 04:00:00 via INTRAVENOUS

## 2016-11-30 MED ORDER — DIPHENHYDRAMINE HCL 50 MG/ML IJ SOLN: 12.5000 mg | INTRAMUSCULAR | Status: DC | PRN

## 2016-11-30 MED ORDER — BENZOCAINE-MENTHOL 20-0.5 % EX AERO
1.0000 "application " | INHALATION_SPRAY | CUTANEOUS | Status: DC | PRN
  Administered 2016-11-30 – 2016-12-02 (×2): 1 via TOPICAL
  Filled 2016-11-30 (×2): qty 56

## 2016-11-30 MED ORDER — OXYCODONE-ACETAMINOPHEN 5-325 MG PO TABS: 2.0000 | ORAL_TABLET | ORAL | Status: DC | PRN

## 2016-11-30 MED ORDER — LACTATED RINGERS IV SOLN: 500.0000 mL | INTRAVENOUS | Status: DC | PRN

## 2016-11-30 MED ORDER — ACETAMINOPHEN 325 MG PO TABS
650.0000 mg | ORAL_TABLET | ORAL | Status: DC | PRN
Start: 2016-11-30 — End: 2016-12-02
  Administered 2016-12-01: 650 mg via ORAL
  Filled 2016-11-30: qty 2

## 2016-11-30 MED ORDER — SIMETHICONE 80 MG PO CHEW: 80.0000 mg | CHEWABLE_TABLET | ORAL | Status: DC | PRN

## 2016-11-30 MED ORDER — ONDANSETRON HCL 4 MG/2ML IJ SOLN: 4.0000 mg | Freq: Four times a day (QID) | INTRAMUSCULAR | Status: DC | PRN

## 2016-11-30 NOTE — Lactation Note (Addendum)
This note was copied from a baby's chart. Lactation Consultation Note:   Lactation Brochure given to Mother with basic breastfeeding done. Mother has a 89 yr old that she attempt to breastfeed and state's that he wouldn't latch. Infant is 7 hours old and has not had a breastfeeding. [facility] to try to breastfeed infant . She reports that she wants to give breastmilk for 2 weeks.  Mother has attempt to breastfeed 4-5 times . No latch was achieved.  Mother was taught hand expression. Observed clear drops of colostrum.  Attempt to latch infant in cross cradle hold. Infant difficult to rouse with exercises. Infant nuzzled and licked mother's nipple.  Infant was fed 4 ml of colostrum with a spoon.  Mother was sat up with a DEBP and suggested to pump for 15 mins every 2-3 hours.  Assist mother with first pumping. Mother reports pumping feels funny and she wanted to turn pump off. Mother had lots of visitors in hallway waiting to visit. Mother advised to attempt to latch infant with feeding cues. Suggested that [facility] or staff nurse to assist with next feeding attempt.  Patient Name: [name] JXBJY'NToday's Date: 11/30/2016 Reason for consult: Initial assessment   Maternal Data    Feeding Feeding Type: Breast Milk Length of feed: 0 min  LATCH Score/Interventions Latch: Too sleepy or reluctant, no latch achieved, no sucking elicited. Intervention(s): Skin to skin;Teach feeding cues;Waking techniques  Audible Swallowing: None Intervention(s): Skin to skin;Hand expression  Type of Nipple: Everted at rest and after stimulation  Comfort (Breast/Nipple): Soft / non-tender     Hold (Positioning): Full assist, staff holds infant at breast Intervention(s): Breastfeeding basics reviewed;Support Pillows;Position options;Skin to skin  LATCH Score: 4  Lactation Tools Discussed/Used Pump Review: Setup, frequency, and cleaning;Milk Storage Initiated by::  Stevan BornSherry Moni Rothrock, RN,IBCLC Date initiated:: 11/30/16   Consult Status Consult Status: Follow-up    Stevan BornKendrick, Lovina Zuver [facility] 11/30/2016, 2:31 PM

## 2016-11-30 NOTE — MAU Note (Signed)
PT  SAYS UC  ARE  STRONG    SINCE  0015.   VE  1-2  CM    IN OFFICE.     HAS  HX  HSV- LAST OUTBREAK- -  DEC OR JAN-   TAKING  VALTREX.     FEELS  ITCHING  ON PERINEUM-  - THINKS   YEAST.       GBS- POSITIVE

## 2016-11-30 NOTE — Plan of Care (Signed)
Problem: Activity: Goal: Ability to tolerate increased activity will improve Outcome: Progressing Pt able to ambulate to the bathroom with minimal assistance.  Pt reports sensation normal in legs but still feels like left leg is weak, although it feels stronger than previously reported.  Pt instructed to call for help to the bathroom at least one more time to ensure she is steady on her feet and not at risk for a fall.

## 2016-11-30 NOTE — Plan of Care (Signed)
Problem: Activity: Goal: Ability to tolerate increased activity will improve Outcome: Progressing Pt gotten up to the bathroom with the steady.  Left leg weak but pt reports movement and feeling are much improved.  Pt able to put some weight on the leg but not for more than a minute.  Pt instructed to call for next trip to the bathroom.  Problem: Urinary Elimination: Goal: Ability to reestablish a normal urinary elimination pattern will improve Outcome: Completed/Met Date Met: 11/30/16 Pt urinating without difficulty.  Pt is having difficulty feeling the need to void but once up and on the toilet, no difficulty starting stream.  Pt encouraged to attempt to void every couple of hours.

## 2016-11-30 NOTE — MAU Note (Signed)
CHARGE SAYS CANNOT  TAKE PT  AT THIS  TIME- NO CLEAN ROOM

## 2016-11-30 NOTE — Anesthesia Postprocedure Evaluation (Signed)
Anesthesia Post Note  Patient: Amy MostHeather Little  Procedure(s) Performed: * No procedures listed *  Patient location during evaluation: Mother Baby Anesthesia Type: Epidural Level of consciousness: awake and alert and oriented Pain management: satisfactory to patient Vital Signs Assessment: post-procedure vital signs reviewed and stable Respiratory status: spontaneous breathing and nonlabored ventilation Cardiovascular status: stable Postop Assessment: no headache, no backache, no signs of nausea or vomiting, adequate PO intake and patient able to bend at knees (patient up walking) Anesthetic complications: no        Last Vitals:  Vitals:   11/30/16 0925 11/30/16 1325  BP: 114/64 125/63  Pulse: 76 81  Resp: 18 18  Temp: 36.8 C 36.9 C    Last Pain:  Vitals:   11/30/16 1732  TempSrc:   PainSc: 6    Pain Goal: Patients Stated Pain Goal: 2 (11/30/16 1732)               Madison HickmanGREGORY,Harsha Yusko

## 2016-11-30 NOTE — H&P (Signed)
LABOR AND DELIVERY ADMISSION HISTORY AND PHYSICAL NOTE  Amy Little is a 26 y.o. female G3P1011 with IUP at [redacted]w[redacted]d by early Ultrasound presenting for SOL. Contraction starting in the PM. Spontaneous rupture of membranes.  No other concerns.  She reports positive fetal movement. She denies leakage of fluid or vaginal bleeding.  Prenatal History/Complications:  Past Medical History: Past Medical History:  Diagnosis Date  . Asthma     Past Surgical History: Past Surgical History:  Procedure Laterality Date  . WISDOM TOOTH EXTRACTION      Obstetrical History: OB History    Gravida Para Term Preterm AB Living   3 1 1   1 1    SAB TAB Ectopic Multiple Live Births   1       1      Social History: Social History   Social History  . Marital status: Married    Spouse name: N/A  . Number of children: N/A  . Years of education: N/A   Social History Main Topics  . Smoking status: Former Smoker    Packs/day: 1.00    Quit date: 10/19/2014  . Smokeless tobacco: Never Used  . Alcohol use No     Comment: 1 beer/month  . Drug use: No  . Sexual activity: Yes    Birth control/ protection: None   Other Topics Concern  . None   Social History Narrative  . None    Family History: Family History  Problem Relation Age of Onset  . Thyroid disease Mother   . COPD Father   . Cancer Maternal Grandfather   . COPD Paternal Grandmother     Allergies: No Known Allergies  Prescriptions Prior to Admission  Medication Sig Dispense Refill Last Dose  . albuterol (PROVENTIL HFA;VENTOLIN HFA) 108 (90 Base) MCG/ACT inhaler Inhale 2 puffs into the lungs every 6 (six) hours as needed for wheezing or shortness of breath.   11/29/2016 at Unknown time  . Prenatal Vit-Fe Fumarate-FA (PRENATAL MULTIVITAMIN) TABS tablet Take 1 tablet by mouth at bedtime.    11/29/2016 at Unknown time  . valACYclovir (VALTREX) 500 MG tablet Take 1 tablet (500 mg total) by mouth daily. 30 tablet 1 11/29/2016 at  Unknown time  . acetaminophen (TYLENOL) 500 MG tablet Take 500 mg by mouth every 6 (six) hours as needed for headache.    Taking  . beclomethasone (QVAR) 40 MCG/ACT inhaler Inhale 2 puffs into the lungs 2 (two) times daily. (Patient not taking: Reported on 11/03/2016) 1 Inhaler 12 Not Taking     Review of Systems   All systems reviewed and negative except as stated in HPI  Blood pressure 121/84, pulse (!) 105, temperature 98.9 F (37.2 C), temperature source Oral, resp. rate 18, height 5\' 7"  (1.702 m), weight 200 lb (90.7 kg), last menstrual period 03/04/2016, unknown if currently breastfeeding. General appearance: alert, cooperative and appears stated age Lungs: no respiratory distress. No audible wheezing Heart: regular rate and distal pulses intact Abdomen: soft, non-tender; bowel sounds normal Extremities: No calf swelling or tenderness Presentation: cephalic by nursing exam Fetal monitoring: category 1 Uterine activity: contractions q3 minutes Dilation: 10 Effacement (%): 100 Station: +1, +2 Exam by:: Melburn Popper, RN   Prenatal labs: ABO, Rh: --/--/A POS (02/21 0245) Antibody: NEG (02/21 0245) Rubella: immune RPR: Non Reactive (12/04 1045)  HBsAg: Negative (07/19 1001)  HIV: Non Reactive (12/04 1045)  GBS: Positive (02/05 1637)  2 hr Glucola: normal Genetic screening:  First trimester screen normal Anatomy US:  normal  Prenatal Transfer Tool  Maternal Diabetes: No Genetic Screening: Normal Maternal Ultrasounds/Referrals: Normal Fetal Ultrasounds or other Referrals:  None Maternal Substance Abuse:  No Significant Maternal Medications:  None Significant Maternal Lab Results: Lab values include: Group B Strep positive  Results for orders placed or performed during the hospital encounter of 11/30/16 (from the past 24 hour(s))  CBC   Collection Time: 11/30/16  2:45 AM  Result Value Ref Range   WBC 12.6 (H) 4.0 - 10.5 K/uL   RBC 3.80 (L) 3.87 - 5.11 MIL/uL    Hemoglobin 11.5 (L) 12.0 - 15.0 g/dL   HCT 16.133.7 (L) 09.636.0 - 04.546.0 %   MCV 88.7 78.0 - 100.0 fL   MCH 30.3 26.0 - 34.0 pg   MCHC 34.1 30.0 - 36.0 g/dL   RDW 40.914.1 81.111.5 - 91.415.5 %   Platelets 258 150 - 400 K/uL  Type and screen [facility]   Collection Time: 11/30/16  2:45 AM  Result Value Ref Range   ABO/RH(D) A POS    Antibody Screen NEG    Sample Expiration 12/03/2016     Patient Active Problem List   Diagnosis Date Noted  . Normal labor 11/30/2016  . GBS (group B Streptococcus carrier), +RV culture, currently pregnant 11/16/2016  . Asthma 10/25/2016  . History of herpes labialis 10/25/2016  . 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 Supervision of normal pregnancy 04/27/2016    Assessment: Amy Little is a 26 y.o. G3P1011 at 818w5d here for  SOL.  #Labor: Expectant mangment #Pain: Epidural #FWB: Category 1 #ID:  GBS pos - pcn #MOF: breast #MOC: condoms #Circ:  n/a  Amy Little 11/30/2016, 6:13 AM

## 2016-11-30 NOTE — Anesthesia Procedure Notes (Signed)
Epidural Patient location during procedure: OB Start time: 11/30/2016 3:36 AM End time: 11/30/2016 3:47 AM  Staffing Anesthesiologist: Chaney MallingHODIERNE, Donovan Gatchel Performed: anesthesiologist   Preanesthetic Checklist Completed: patient identified, site marked, pre-op evaluation, timeout performed, IV checked, risks and benefits discussed and monitors and equipment checked  Epidural Patient position: sitting Prep: DuraPrep Patient monitoring: heart rate, cardiac monitor, continuous pulse ox and blood pressure Approach: midline Location: L2-L3 Injection technique: LOR saline  Needle:  Needle type: Tuohy  Needle gauge: 17 G Needle length: 9 cm Needle insertion depth: 6 cm Catheter type: closed end flexible Catheter size: 19 Gauge Catheter at skin depth: 11 cm Test dose: negative and Other  Assessment Events: blood not aspirated, injection not painful, no injection resistance and negative IV test  Additional Notes Informed consent obtained prior to proceeding including risk of failure, 1% risk of PDPH, risk of minor discomfort and bruising.  Discussed rare but serious complications including epidural abscess, permanent nerve injury, epidural hematoma.  Discussed alternatives to epidural analgesia and patient desires to proceed.  Timeout performed pre-procedure verifying patient name, procedure, and platelet count.  Patient tolerated procedure well. Reason for block:procedure for pain

## 2016-11-30 NOTE — Anesthesia Preprocedure Evaluation (Signed)
Anesthesia Evaluation  Patient identified by MRN, date of birth, ID band Patient awake    Reviewed: Allergy & Precautions, H&P , NPO status , Patient's Chart, lab work & pertinent test results  Airway Mallampati: II   Neck ROM: full    Dental   Pulmonary asthma , former smoker,    breath sounds clear to auscultation       Cardiovascular negative cardio ROS   Rhythm:regular Rate:Normal     Neuro/Psych    GI/Hepatic   Endo/Other    Renal/GU      Musculoskeletal   Abdominal   Peds  Hematology   Anesthesia Other Findings   Reproductive/Obstetrics                             Anesthesia Physical Anesthesia Plan  ASA: II  Anesthesia Plan: Epidural   Post-op Pain Management:    Induction: Intravenous  Airway Management Planned: Natural Airway  Additional Equipment:   Intra-op Plan:   Post-operative Plan:   Informed Consent: I have reviewed the patients History and Physical, chart, labs and discussed the procedure including the risks, benefits and alternatives for the proposed anesthesia with the patient or authorized representative who has indicated his/her understanding and acceptance.     Plan Discussed with: Anesthesiologist  Anesthesia Plan Comments:         Anesthesia Quick Evaluation

## 2016-12-01 LAB — CBC
HEMATOCRIT: 30.5 % — AB (ref 36.0–46.0)
Hemoglobin: 10.8 g/dL — ABNORMAL LOW (ref 12.0–15.0)
MCH: 31 pg (ref 26.0–34.0)
MCHC: 35.4 g/dL (ref 30.0–36.0)
MCV: 87.6 fL (ref 78.0–100.0)
Platelets: 204 10*3/uL (ref 150–400)
RBC: 3.48 MIL/uL — ABNORMAL LOW (ref 3.87–5.11)
RDW: 14.3 % (ref 11.5–15.5)
WBC: 11.3 10*3/uL — ABNORMAL HIGH (ref 4.0–10.5)

## 2016-12-01 LAB — RPR: RPR: NONREACTIVE

## 2016-12-01 MED ORDER — FERROUS SULFATE 325 (65 FE) MG PO TABS
325.0000 mg | ORAL_TABLET | Freq: Every day | ORAL | Status: DC
  Administered 2016-12-01 – 2016-12-02 (×2): 325 mg via ORAL
  Filled 2016-12-01 (×2): qty 1

## 2016-12-01 NOTE — Progress Notes (Signed)
Post Partum Day #1 Subjective: no complaints, up ad lib, voiding and tolerating PO  Objective: Blood pressure 116/63, pulse 65, temperature 98 F (36.7 C), temperature source Oral, resp. rate 18, height 5\' 7"  (1.702 m), weight 200 lb (90.7 kg), last menstrual period 03/04/2016, SpO2 98 %, unknown if currently breastfeeding.  Physical Exam:  General: alert, cooperative and no distress Lochia: appropriate Uterine Fundus: firm Incision: 2nd deg: healing DVT Evaluation: No evidence of DVT seen on physical exam. No cords or calf tenderness. No significant calf/ankle edema.   Recent Labs  11/30/16 0245 12/01/16 0609  HGB 11.5* 10.8*  HCT 33.7* 30.5*    Assessment/Plan: Plan for discharge tomorrow, Breastfeeding, Lactation consult and Contraception condoms   LOS: 1 day   Roe CoombsRachelle A Benson Porcaro, CNM 12/01/2016, 7:22 AM

## 2016-12-01 NOTE — Plan of Care (Signed)
Problem: Nutritional: Goal: Mothers verbalization of comfort with breastfeeding process will improve Outcome: Completed/Met Date Met: 12/01/16 Mother giving good amount of formula due to her anxiousness with breast feeding throughout the night.She gave up breast feeding with her first child. Encouraged mother to stay consistent with pumping and continue to attempt latching baby if she still wanted to latch baby. Encouraged mother to call for latching assistance in order for staff to help her feel more comfortable before going home.

## 2016-12-01 NOTE — Lactation Note (Signed)
This note was copied from a baby's chart. Lactation Consultation Note: Mother reports that she is pumping her breast every 4 hours . She states she is getting less than yesterday. Mother given support and encouragement. Advised mother to start pumping every 2-3 hours for 15-20 mins. Mother agreeable to pumping more frequently. Mother has a Hygia double electric pump someone gave her. Informed mother that this is a good pump to use. Mother is aware of available Lactation services.  Patient Name: Amy Elisabeth MostHeather Little ZOXWR'UToday's Date: 12/01/2016 Reason for consult: Follow-up assessment   Maternal Data    Feeding Feeding Type: Bottle Fed - Formula Nipple Type: Slow - flow  LATCH Score/Interventions                      Lactation Tools Discussed/Used     Consult Status Consult Status: Follow-up Date: 12/01/16 Follow-up type: In-patient    Stevan BornKendrick, Orhan Mayorga [facility] 12/01/2016, 1:55 PM

## 2016-12-02 MED ORDER — IBUPROFEN 600 MG PO TABS: 600.0000 mg | ORAL_TABLET | Freq: Four times a day (QID) | ORAL | 0 refills | Status: DC

## 2016-12-02 NOTE — Lactation Note (Signed)
This note was copied from a baby's chart. Lactation Consultation Note  Patient Name: Amy Elisabeth MostHeather Niemczyk [id number]'[id number]'s Date: 12/02/2016 Reason for consult: Follow-up assessment;Infant weight loss Baby is 4552 hours old and had attempts with breast feeding initially and since has had all bottles.  Per mom has only pumped x4 since birth and none 11-7. Per mom has a DEBP Enjoy pump for home. @ this consult - mom willing to try latching the baby , LC attempted to latch 1st , no success, and tried giving the baby and appetizer with the bottle ( what she had got'en used to ).  And then tried latching and the baby was to fussy, on and off few sucks.  LC recommended when at home , get a good nap first, and continue pumping both breast very 2-3 hours for 15 -20 mins around the clock and dad is pace feeding the baby.  LC reassured mom it can be a different breast feeding story for latching when the milk is in.  [facility]  Recommended working on latching. Also to consider [facility]. When her milk comes in.  Sore nipple and engorgement prevention and tx reviewed.  Mother informed of post-discharge support and given phone number to the lactation department, including services for phone call assistance; out-patient appointments; and breastfeeding support group. List of other breastfeeding resources in the community given in the handout. Encouraged mother to call for problems or concerns related to breastfeeding.  Maternal Data Has patient been taught Hand Expression?: Yes Does the patient have breastfeeding experience prior to this delivery?: Yes  Feeding Feeding Type: Bottle Fed - Formula Nipple Type: Slow - flow Length of feed:  (on and off , few sucks )  LATCH Score/Interventions Latch: Repeated attempts needed to sustain latch, nipple held in mouth throughout feeding, stimulation needed to elicit sucking reflex. Intervention(s): Skin to skin;Teach feeding cues;Waking  techniques Intervention(s): Adjust position;Assist with latch;Breast massage  Audible Swallowing: None  Type of Nipple: Flat (compressible )  Comfort (Breast/Nipple): Soft / non-tender     Hold (Positioning): Full assist, staff holds infant at breast Intervention(s): Breastfeeding basics reviewed (see LC note )  LATCH Score: 4  Lactation Tools Discussed/Used     Consult Status Consult Status: Complete Date: 12/02/16 Follow-up type: In-patient    Matilde SprangMargaret Ann Abuk Selleck 12/02/2016, 10:49 AM

## 2016-12-02 NOTE — Discharge Summary (Signed)
Obstetric Discharge Summary  Reason for Admission: SOL at 39 weeks Prenatal Procedures: ultrasound Intrapartum Procedures: NSVD Postpartum Procedures: none Complications-Operative and Postpartum: 2nd degree perineal laceration Hemoglobin  Date Value Ref Range Status  12/01/2016 10.8 (L) 12.0 - 15.0 g/dL Final   HCT  Date Value Ref Range Status  12/01/2016 30.5 (L) 36.0 - 46.0 % Final   Hematocrit  Date Value Ref Range Status  09/12/2016 36.7 34.0 - 46.6 % Final    Physical Exam:  General: alert Lochia: appropriate Uterine Fundus: firm and NT @ U-2 DVT Evaluation: No evidence of DVT seen on physical exam.  Discharge Diagnoses: Term Pregnancy-delivered  Discharge Information: Date: 12/02/2016 Activity: pelvic rest Diet: routine Medications: PNV and Ibuprofen Condition: stable Instructions: refer to practice specific booklet Discharge to: home  She plans condoms prn. Follow-up Information    Roe Coombsachelle A Denney, CNM. Schedule an appointment as soon as possible for a visit in 6 week(s).   Specialty:  Certified Nurse Midwife Contact information: [address] [place] [id number] [phone]           Newborn Data: Live born female  Birth Weight: 7 lb 5.8 oz (3340 g) APGAR: 9, 9  Home with mother.  Allie BossierMyra C Chonita Gadea 12/02/2016, 7:32 AM

## 2016-12-02 NOTE — Discharge Instructions (Signed)

## 2016-12-02 NOTE — Plan of Care (Signed)
Problem: Education: Goal: Knowledge of condition will improve Discharge education reviewed with patient and significant other. Questions answered about self care and baby care. Patient and significant other verbalize understanding. a

## 2016-12-04 ENCOUNTER — Inpatient Hospital Stay (HOSPITAL_COMMUNITY)
Admission: AD | Admit: 2016-12-04 | Discharge: 2016-12-04 | Disposition: A | Discharge status: Home / Self Care | Payer: Medicaid Other | Source: Ambulatory Visit | Attending: Obstetrics and Gynecology | Admitting: Obstetrics and Gynecology

## 2016-12-04 ENCOUNTER — Encounter (HOSPITAL_COMMUNITY): Payer: Self-pay | Admitting: *Deleted

## 2016-12-04 DIAGNOSIS — Z87891 Personal history of nicotine dependence: Secondary | ICD-10-CM | POA: Insufficient documentation

## 2016-12-04 DIAGNOSIS — M7989 Other specified soft tissue disorders: Secondary | ICD-10-CM | POA: Diagnosis present

## 2016-12-04 DIAGNOSIS — J45909 Unspecified asthma, uncomplicated: Secondary | ICD-10-CM | POA: Insufficient documentation

## 2016-12-04 DIAGNOSIS — O9089 Other complications of the puerperium, not elsewhere classified: Secondary | ICD-10-CM | POA: Diagnosis not present

## 2016-12-04 DIAGNOSIS — R600 Localized edema: Secondary | ICD-10-CM | POA: Diagnosis not present

## 2016-12-04 DIAGNOSIS — N939 Abnormal uterine and vaginal bleeding, unspecified: Secondary | ICD-10-CM | POA: Diagnosis present

## 2016-12-04 HISTORY — DX: Unspecified abnormal cytological findings in specimens from vagina: R87.629

## 2016-12-04 NOTE — MAU Provider Note (Signed)
History   [id number]   Chief Complaint  Patient presents with  . Vaginal Bleeding  . Leg Swelling    HPI Amy Little is a 26 y.o. female  [phone] here with report of increased right leg swelling that started today.  No report of pain in leg, + intermittent numbness.  Here s/p NSVD on 11/30/16 with a 2nd degree laceration.  Normal postpartum course.    No LMP recorded.  OB History  Gravida Para Term Preterm AB Living  3 2 2   1 2   SAB TAB Ectopic Multiple Live Births  1     0 2    # Outcome Date GA Lbr Len/2nd Weight Sex Delivery Anes PTL Lv  3 Term 11/30/16 5637w5d 06:10 / 00:54 3.34 kg (7 lb 5.8 oz) F Vag-Spont EPI  LIV  2 Term 2012    M Vag-Spont EPI N LIV  1 SAB               Past Medical History:  Diagnosis Date  . Asthma   . Vaginal Pap smear, abnormal     Family History  Problem Relation Age of Onset  . Thyroid disease Mother   . COPD Father   . Cancer Maternal Grandfather   . COPD Paternal Grandmother     Social History   Social History  . Marital status: Married    Spouse name: N/A  . Number of children: N/A  . Years of education: N/A   Social History Main Topics  . Smoking status: Former Smoker    Packs/day: 1.00    Quit date: 10/19/2014  . Smokeless tobacco: Never Used  . Alcohol use No     Comment: 1 beer/month  . Drug use: No  . Sexual activity: Not Currently    Birth control/ protection: None   Other Topics Concern  . None   Social History Narrative  . None    No Known Allergies  No current facility-administered medications on file prior to encounter.    Current Outpatient Prescriptions on File Prior to Encounter  Medication Sig Dispense Refill  . acetaminophen (TYLENOL) 500 MG tablet Take 500 mg by mouth every 6 (six) hours as needed for headache.     . albuterol (PROVENTIL HFA;VENTOLIN HFA) 108 (90 Base) MCG/ACT inhaler Inhale 2 puffs into the lungs every 6 (six) hours as needed for wheezing or shortness of breath.    Marland Kitchen.  ibuprofen (ADVIL,MOTRIN) 600 MG tablet Take 1 tablet (600 mg total) by mouth every 6 (six) hours. 30 tablet 0  . Prenatal Vit-Fe Fumarate-FA (PRENATAL MULTIVITAMIN) TABS tablet Take 1 tablet by mouth at bedtime.     . valACYclovir (VALTREX) 500 MG tablet Take 1 tablet (500 mg total) by mouth daily. 30 tablet 1  . beclomethasone (QVAR) 40 MCG/ACT inhaler Inhale 2 puffs into the lungs 2 (two) times daily. (Patient not taking: Reported on 11/03/2016) 1 Inhaler 12     Review of Systems  Constitutional: Negative for chills and fever.  Respiratory: Negative for shortness of breath.   Cardiovascular: Positive for leg swelling (right extremity).  Gastrointestinal: Negative for nausea and vomiting.  Genitourinary: Positive for pelvic pain (cramping, mild) and vaginal bleeding (scant). Negative for vaginal discharge.  All other systems reviewed and are negative.    Physical Exam   Vitals:   12/04/16 2153 12/04/16 2155  BP: 131/79   Pulse: 79   Resp: 18   Temp:  98.3 F (36.8 C)  TempSrc:  Oral  Physical Exam  Constitutional: She is oriented to person, place, and time. She appears well-developed and well-nourished. No distress.  HENT:  Head: Normocephalic.  Neck: Normal range of motion. Neck supple.  Cardiovascular: Normal rate, regular rhythm and normal heart sounds.   Respiratory: Effort normal and breath sounds normal. No respiratory distress.  GI: Soft.  FF 3 below umbilicus  Genitourinary: There is bleeding (scant, no clots) in the vagina.  Musculoskeletal: Normal range of motion.       Right lower leg: She exhibits edema (1+ edema). She exhibits no tenderness and no swelling.  Right extremity +sensory and motor intact; swelling slightly more than left side; negative Homan's  Neurological: She is alert and oriented to person, place, and time. She has normal reflexes.  Skin: Skin is warm and dry.    MAU Course  Procedures  MDM Exam  Assessment and Plan  Postpartum Pedal  Edema - normal exam  Plan: Discharge home Recommended elevation of extremities Increase fluids/ambulation Report pain/heat  Marlis Edelson, CNM 12/04/2016 10:35 PM

## 2016-12-04 NOTE — Discharge Instructions (Signed)

## 2016-12-04 NOTE — MAU Note (Signed)
Pt presents complaining of an increase in swelling in her right leg that started today. Denies pain in the leg but reports a feeling of numbness. Is having normal vaginal bleeding but passed a clot today that was concerning.

## 2016-12-05 ENCOUNTER — Encounter: Payer: Medicaid Other | Admitting: Certified Nurse Midwife

## 2016-12-05 ENCOUNTER — Institutional Professional Consult (permissible substitution): Payer: Medicaid Other | Admitting: Internal Medicine

## 2016-12-06 ENCOUNTER — Other Ambulatory Visit: Payer: Self-pay | Admitting: Certified Nurse Midwife

## 2016-12-06 DIAGNOSIS — J4532 Mild persistent asthma with status asthmaticus: Secondary | ICD-10-CM

## 2016-12-06 MED ORDER — BECLOMETHASONE DIPROP HFA 40 MCG/ACT IN AERB: 2.0000 | INHALATION_SPRAY | Freq: Every day | RESPIRATORY_TRACT | 99 refills | Status: DC

## 2016-12-26 ENCOUNTER — Institutional Professional Consult (permissible substitution): Payer: Medicaid Other | Admitting: Internal Medicine

## 2016-12-27 ENCOUNTER — Telehealth: Payer: Self-pay | Admitting: *Deleted

## 2016-12-27 ENCOUNTER — Inpatient Hospital Stay (HOSPITAL_COMMUNITY)
Admission: AD | Admit: 2016-12-27 | Discharge: 2016-12-27 | Discharge status: Against Medical Advice | Payer: Medicaid Other | Source: Ambulatory Visit | Attending: Family Medicine | Admitting: Family Medicine

## 2016-12-27 DIAGNOSIS — N898 Other specified noninflammatory disorders of vagina: Secondary | ICD-10-CM | POA: Diagnosis not present

## 2016-12-27 LAB — POCT PREGNANCY, URINE: Preg Test, Ur: NEGATIVE

## 2016-12-27 LAB — URINALYSIS, ROUTINE W REFLEX MICROSCOPIC
BILIRUBIN URINE: NEGATIVE
Glucose, UA: NEGATIVE mg/dL
HGB URINE DIPSTICK: NEGATIVE
Ketones, ur: NEGATIVE mg/dL
Leukocytes, UA: NEGATIVE
Nitrite: NEGATIVE
Protein, ur: NEGATIVE mg/dL
SPECIFIC GRAVITY, URINE: 1.016 (ref 1.005–1.030)
pH: 7 (ref 5.0–8.0)

## 2016-12-27 NOTE — Telephone Encounter (Signed)
Patient is 4 weeks post partum- she is concerned about some discharge she is having. She complains of large amounts of "mucus" almost tissue like. Some sharp pain in the last day. No fever at this point.  Offered patient an appointment to come in- but the only time she can be seen is at night when her husband gets off work. She states she is going to go to MAU.

## 2016-12-27 NOTE — MAU Note (Signed)
Not in lobby x2.

## 2016-12-27 NOTE — MAU Note (Signed)
Not in lobby x1  

## 2016-12-27 NOTE — MAU Note (Signed)
Not in lobby x3. Pt left AMA without notifying staff 

## 2016-12-27 NOTE — MAU Note (Signed)
Vag on Feb 21, has been having mucus discharge for the last few days, passed ? Tissue last night.  Hasn't stopped bleeding since delivery, is a little heavier today.  Denies pain.

## 2016-12-28 ENCOUNTER — Ambulatory Visit (INDEPENDENT_AMBULATORY_CARE_PROVIDER_SITE_OTHER): Payer: Medicaid Other | Admitting: Obstetrics and Gynecology

## 2016-12-28 ENCOUNTER — Encounter: Payer: Self-pay | Admitting: Obstetrics and Gynecology

## 2016-12-28 VITALS — BP 129/88 | HR 103 | Temp 97.3°F | Wt 175.0 lb

## 2016-12-28 DIAGNOSIS — R87610 Atypical squamous cells of undetermined significance on cytologic smear of cervix (ASC-US): Secondary | ICD-10-CM

## 2016-12-28 DIAGNOSIS — R87810 Cervical high risk human papillomavirus (HPV) DNA test positive: Secondary | ICD-10-CM

## 2016-12-28 NOTE — Progress Notes (Signed)
Subjective:     Amy MostHeather Little is a 26 y.o. female who presents for a postpartum visit. She is 4 weeks postpartum following a spontaneous vaginal delivery. I have fully reviewed the prenatal and intrapartum course. The delivery was at 38w 4d gestational weeks. Outcome: spontaneous vaginal delivery. Anesthesia: epidural. Postpartum course has been UNREMARKABLE. Baby's course has been UNREMARKABLE. Baby is feeding by bottle - Similac Advance. Bleeding no bleeding. Bowel function is normal. Bladder function is normal. Patient is not sexually active. Contraception method is abstinence. Postpartum depression screening: negative.  The following portions of the patient's history were reviewed and updated as appropriate: allergies, current medications, past family history, past medical history, past social history and past surgical history.  Review of Systems Pertinent items are noted in HPI.   Objective:    BP 129/88   Pulse (!) 103   Temp 97.3 F (36.3 C)   Wt 175 lb (79.4 kg)   Breastfeeding? No   BMI 27.41 kg/m   General:  alert   Breasts:  not examined  Lungs: clear to auscultation bilaterally  Heart:  regular rate and rhythm, S1, S2 normal, no murmur, click, rub or gallop  Abdomen: soft, non-tender; bowel sounds normal; no masses,  no organomegaly   Vulva:  normal  Vagina: normal vagina laceration healing well, some mucoid discharge  Cervix:  multiparous appearance  Corpus: normal size, contour, position, consistency, mobility, non-tender  Adnexa:  normal adnexa  Rectal Exam: Not performed.        Assessment:     Nl postpartum exam.    Abnormal pap smear  Plan:    1. Contraception: condoms 2. Pt to schedule colpo for abnormal pap smear

## 2016-12-28 NOTE — Patient Instructions (Signed)
Colposcopy Colposcopy is a procedure to examine the lowest part of the uterus (cervix), the vagina, and the area around the vaginal opening (vulva) for abnormalities or signs of disease. The procedure is done using a lighted microscope or magnifying lens (colposcope). If any unusual cells are found during the procedure, your health care provider may remove a tissue sample for testing (biopsy). A colposcopy may be done if you:  Have an abnormal Pap test. A Pap test is a screening test that is used to check for signs of cancer or infection of the vagina, cervix, and uterus.  Have a Pap smear test in which you test positive for high-risk HPV (human papillomavirus).  Have a sore or lesion on your cervix.  Have genital warts on your vulva, vagina, or cervix.  Took certain medicines while pregnant, such as diethylstilbestrol (DES).  Have pain during sexual intercourse.  Have vaginal bleeding, especially after sexual intercourse.  Need to have a cervical polyp removed.  Need to have a lost intrauterine device (IUD) string located. Let your health care provider know about:  Any allergies you have, including allergies to prescribed medicine, latex, or iodine.  All medicines you are taking, including vitamins, herbs, eye drops, creams, and over-the-counter medicines. Bring a list of all of your medicines to your appointment.  Any problems you or family members have had with anesthetic medicines.  Any blood disorders you have.  Any surgeries you have had.  Any medical conditions you have, such as pelvic inflammatory disease (PID) or endometrial disorder.  Any history of frequent fainting.  Your menstrual cycle and what form of birth control (contraception) you use.  Your medical history, including any prior cervical treatment.  Whether you are pregnant or may be pregnant. What are the risks? Generally, this is a safe procedure. However, problems may occur,  including:  Pain.  Infection, which may include a fever, bad-smelling discharge, or pelvic pain.  Bleeding or discharge.  Misdiagnosis.  Fainting and vasovagal reactions, but this is rare.  Allergic reactions to medicines.  Damage to other structures or organs. What happens before the procedure?  If you have your menstrual period or will have it at the time of your procedure, tell your health care provider. A colposcopy typically is not done during menstruation.  Continue your contraceptive practices before and after the procedure.  For 24 hours before the colposcopy:  Do not douche.  Do not use tampons.  Do not use medicines, creams, or suppositories in the vagina.  Do not have sexual intercourse.  Ask your health care provider about:  Changing or stopping your regular medicines. This is especially important if you are taking diabetes medicines or blood thinners.  Taking medicines such as aspirin and ibuprofen. These medicines can thin your blood. Do not take these medicines before your procedure if your health care provider instructs you not to. It is likely that your health care provider will tell you to avoid taking aspirin or medicine that contains aspirin for 7 days before the procedure.  Follow instructions from your health care provider about eating or drinking restrictions. You will likely need to eat a regular diet the day of the procedure and not skip any meals.  You may have an exam or testing. A pregnancy test will be taken on the day of the procedure.  You may have a blood or urine sample taken.  Plan to have someone take you home from the hospital or clinic.  If you will be going   home right after the procedure, plan to have someone with you for 24 hours. What happens during the procedure?  You will lie down on your back, with your feet in foot rests (stirrups).  A warmed and lubricated instrument (speculum) will be inserted into your vagina. The  speculum will be used to hold apart the walls of your vagina so your health care provider can see your cervix and the inside of your vagina.  A cotton swab will be used to place a small amount of liquid solution on the areas to be examined. This solution makes it easier to see abnormal cells. You may feel a slight burning during this part.  The colposcope will be used to scan the cervix with a bright white light. The colposcope will be held near your vulvaand will magnify your vulva, vagina, and cervix for easier examination.  Your health care provider may decide to take a biopsy. If so:  You may be given medicine to numb the area (local anesthetic).  Surgical instruments will be used to suck out mucus and cells through your vagina.  You may feel mild pain while the tissue sample is removed.  Bleeding may occur. A solution may be used to stop the bleeding.  If a sample of tissue is needed from the inside of the cervix, a different procedure called endocervical curettage (ECC) may be completed. During this procedure, a curved instrument (curette) will be used to scrape cells from your cervix or the top of your cervix (endocervix).  Your health care provider will record the location of any abnormalities. The procedure may vary among health care providers and hospitals. What happens after the procedure?  You will lie down and rest for a few minutes. You may be offered juice or cookies.  Your blood pressure, heart rate, breathing rate, and blood oxygen level will be monitored until any medicines you were given have worn off.  You may have to wear compression stockings. These stockings help to prevent blood clots and reduce swelling in your legs.  You may have some cramping in your abdomen. This should go away after a few minutes. This information is not intended to replace advice given to you by your health care provider. Make sure you discuss any questions you have with your health care  provider. Document Released: 12/17/2002 Document Revised: 05/24/2016 Document Reviewed: 05/02/2016 Elsevier Interactive Patient Education  2017 [employer]  

## 2017-01-16 ENCOUNTER — Encounter: Payer: Self-pay | Admitting: Obstetrics and Gynecology

## 2017-01-16 ENCOUNTER — Ambulatory Visit (INDEPENDENT_AMBULATORY_CARE_PROVIDER_SITE_OTHER): Payer: Medicaid Other | Admitting: Obstetrics and Gynecology

## 2017-01-16 ENCOUNTER — Other Ambulatory Visit (HOSPITAL_COMMUNITY)
Admission: RE | Admit: 2017-01-16 | Discharge: 2017-01-16 | Disposition: A | Discharge status: Home / Self Care | Payer: Medicaid Other | Source: Ambulatory Visit | Attending: Obstetrics and Gynecology | Admitting: Obstetrics and Gynecology

## 2017-01-16 VITALS — BP 121/81 | HR 71 | Ht 67.0 in | Wt 176.1 lb

## 2017-01-16 DIAGNOSIS — N870 Mild cervical dysplasia: Secondary | ICD-10-CM | POA: Diagnosis not present

## 2017-01-16 DIAGNOSIS — R87610 Atypical squamous cells of undetermined significance on cytologic smear of cervix (ASC-US): Secondary | ICD-10-CM | POA: Diagnosis not present

## 2017-01-16 DIAGNOSIS — R87810 Cervical high risk human papillomavirus (HPV) DNA test positive: Secondary | ICD-10-CM | POA: Diagnosis not present

## 2017-01-16 DIAGNOSIS — Z3202 Encounter for pregnancy test, result negative: Secondary | ICD-10-CM | POA: Diagnosis not present

## 2017-01-16 LAB — POCT URINE PREGNANCY: PREG TEST UR: NEGATIVE

## 2017-01-16 NOTE — Progress Notes (Signed)
Patient with ASCUS +HPV on pap smear here for colposcopy Patient given informed consent, signed copy in the chart, time out was performed.  Placed in lithotomy position. Cervix viewed with speculum and colposcope after application of acetic acid.   Colposcopy adequate?  yes Acetowhite lesions? Yes 11-1 o clock Punctation? no Mosaicism?  no Abnormal vasculature?  no Biopsies? Yes 11 and 12 ECC? yes  COMMENTS:  Patient was given post procedure instructions.  She will return in 2 weeks for results.  Catalina Antigua, MD

## 2017-01-16 NOTE — Progress Notes (Signed)
Patient is in the office for Colpo procedure.

## 2017-01-23 ENCOUNTER — Telehealth: Payer: Self-pay

## 2017-01-23 NOTE — Telephone Encounter (Signed)
Pt aware of test results and to repeat pap in 1 yr. During call, pt c/o heavy VB after the procedure. Pt LMP a couple days before the bx. Informed pt it is normal to have irregular bleeding after a biopsy. She said she's having light bleeding today. Pt denies changing a pad every hr or less and denies cramping. Pt informed to contact office if VB and or cramping worsens. Pt agrees and has no further questions.

## 2017-01-23 NOTE — Telephone Encounter (Signed)
-----   Message from Catalina Antigua, MD sent at 01/18/2017  1:51 PM EDT ----- Please inform patient that her colposcopy results are consistent with her pap smear. She needs to follow up in 1 year for repeat pap. I misinformed her at the time of her colposcopy and stated that she needed repeat pap in 6 months when the reality is 1 year. I apologize about that. In the meantime, she should try to boost her immune system as discussed by eating well, exercising regularly and taking a vitamin B complex daily  Thanks  [place]
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
## 2017-12-20 ENCOUNTER — Encounter (HOSPITAL_COMMUNITY): Payer: Self-pay | Admitting: Psychiatry

## 2017-12-20 ENCOUNTER — Ambulatory Visit (INDEPENDENT_AMBULATORY_CARE_PROVIDER_SITE_OTHER): Payer: Medicaid Other | Admitting: Psychiatry

## 2017-12-20 VITALS — BP 125/84 | HR 86 | Ht 67.0 in | Wt 156.0 lb

## 2017-12-20 DIAGNOSIS — F410 Panic disorder [episodic paroxysmal anxiety] without agoraphobia: Secondary | ICD-10-CM | POA: Diagnosis not present

## 2017-12-20 DIAGNOSIS — F17210 Nicotine dependence, cigarettes, uncomplicated: Secondary | ICD-10-CM

## 2017-12-20 DIAGNOSIS — Z62810 Personal history of physical and sexual abuse in childhood: Secondary | ICD-10-CM | POA: Diagnosis not present

## 2017-12-20 DIAGNOSIS — F419 Anxiety disorder, unspecified: Secondary | ICD-10-CM

## 2017-12-20 DIAGNOSIS — Z6379 Other stressful life events affecting family and household: Secondary | ICD-10-CM

## 2017-12-20 DIAGNOSIS — F331 Major depressive disorder, recurrent, moderate: Secondary | ICD-10-CM

## 2017-12-20 DIAGNOSIS — Z560 Unemployment, unspecified: Secondary | ICD-10-CM | POA: Diagnosis not present

## 2017-12-20 DIAGNOSIS — R450 Nervousness: Secondary | ICD-10-CM

## 2017-12-20 DIAGNOSIS — G4710 Hypersomnia, unspecified: Secondary | ICD-10-CM | POA: Diagnosis not present

## 2017-12-20 DIAGNOSIS — G4700 Insomnia, unspecified: Secondary | ICD-10-CM | POA: Diagnosis not present

## 2017-12-20 MED ORDER — SERTRALINE HCL 50 MG PO TABS: ORAL_TABLET | ORAL | 0 refills | Status: DC

## 2017-12-20 NOTE — Patient Instructions (Signed)
1. Start sertraline 25 mg at night for one week, then 50 mg daily  2. Referral to therapy 3. Ask your mother for help to take care of her son 4. Return to clinic in three weeks for 30 mins
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
## 2018-01-10 ENCOUNTER — Ambulatory Visit (HOSPITAL_COMMUNITY): Payer: Self-pay | Admitting: Psychiatry

## 2018-01-28 IMAGING — US US OB COMP LESS 14 WK
1 series · 15 of 28 positions shown · non-contrast
Comparison: None.

CLINICAL DATA: Intermittent left lower quadrant pain since 7 a.m.

EXAM:
OBSTETRIC <14 WK ULTRASOUND
TECHNIQUE: Transabdominal ultrasound was performed for evaluation of the
gestation as well as the maternal uterus and adnexal regions.

[Series 1: us ob comp less 14 wk · 15 of 42 slices shown]
[im 1/42]
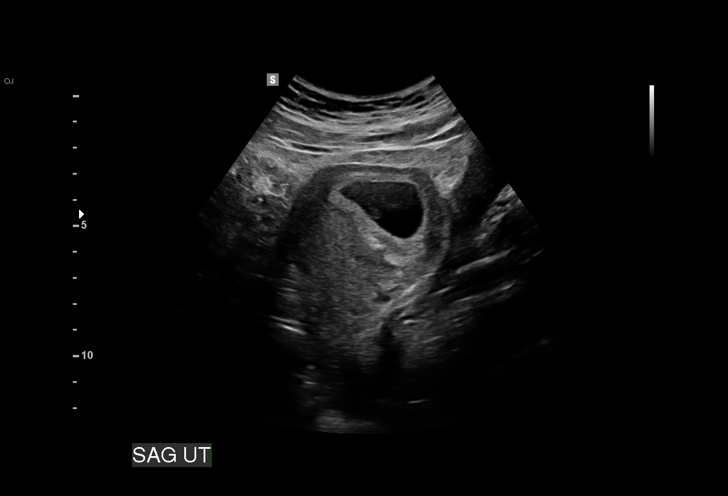
[im 4/42]
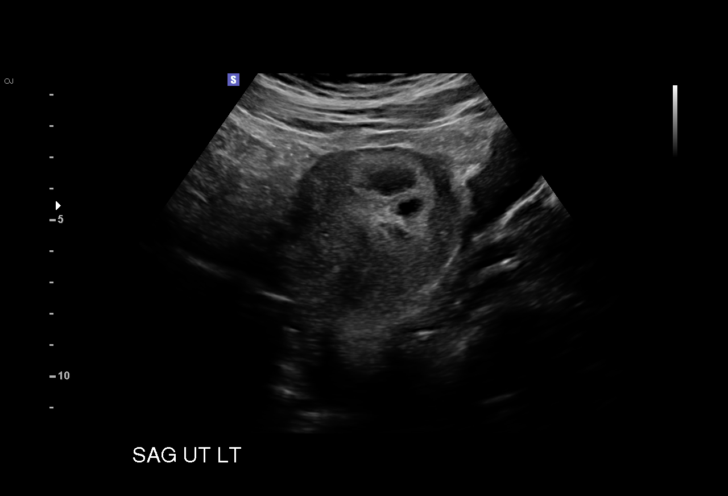
[im 7/42]
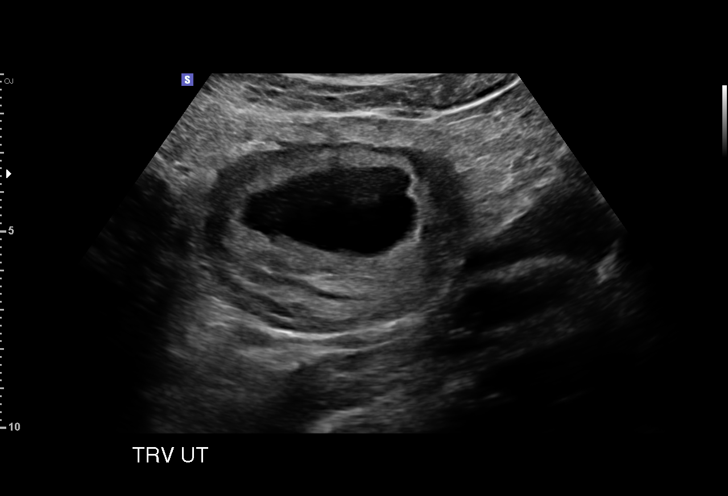
[im 10/42]
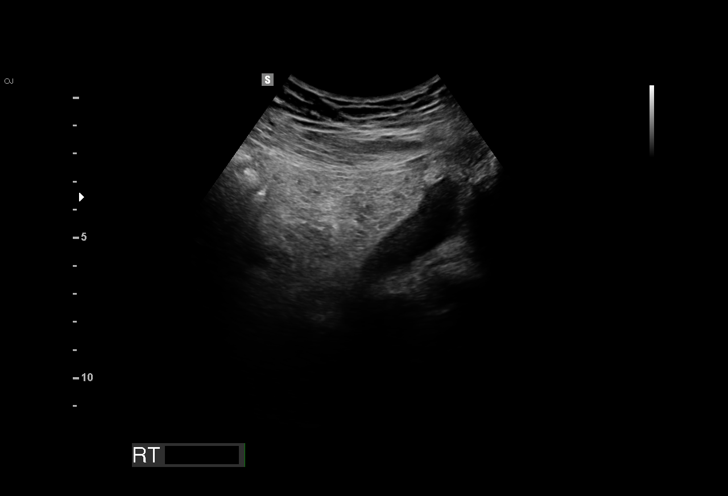
[im 13/42]
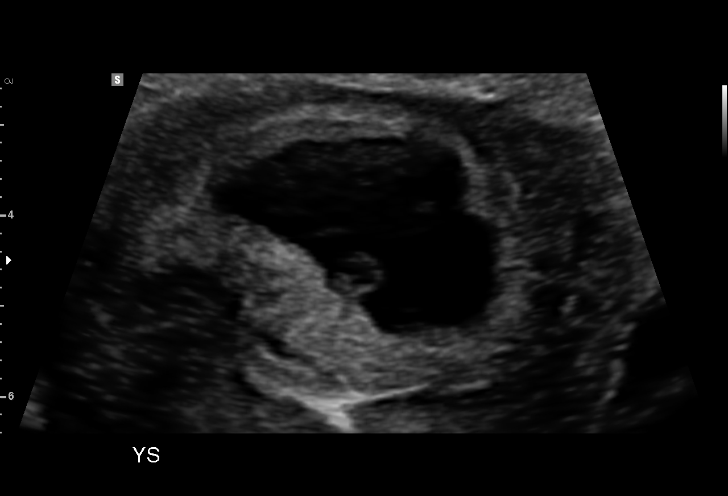
[im 16/42]
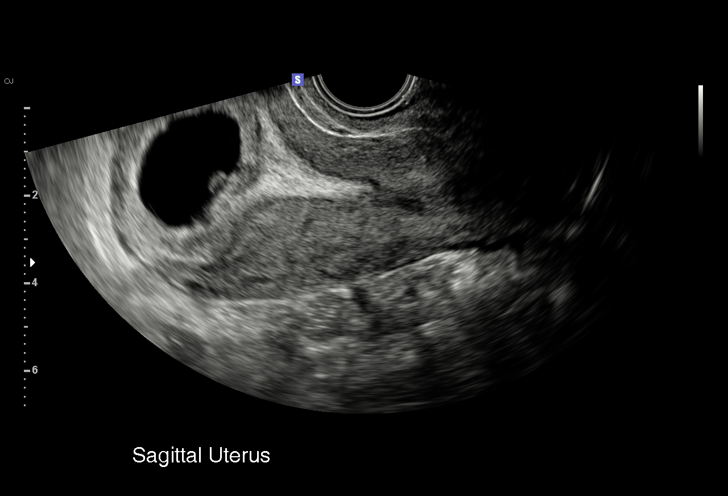
[im 19/42]
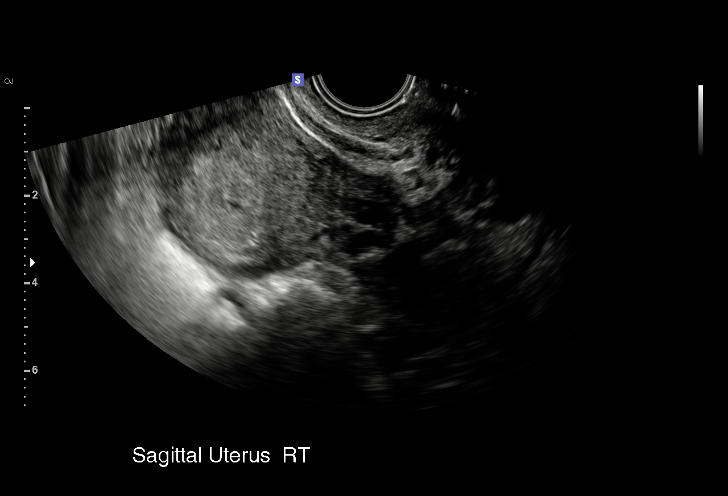
[im 22/42]
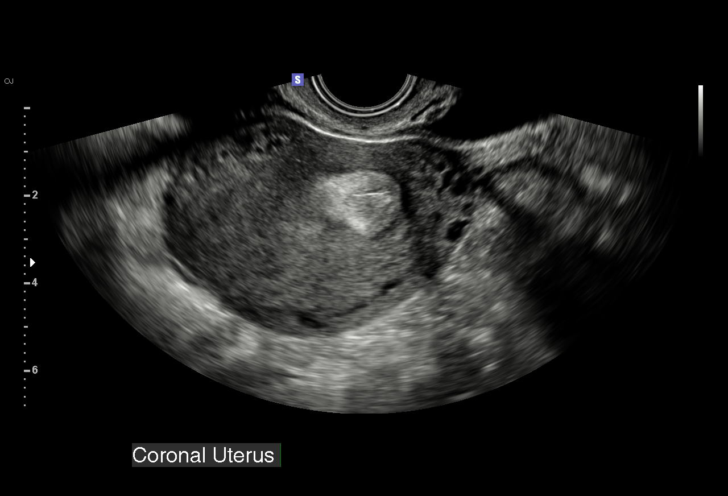
[im 23/42]
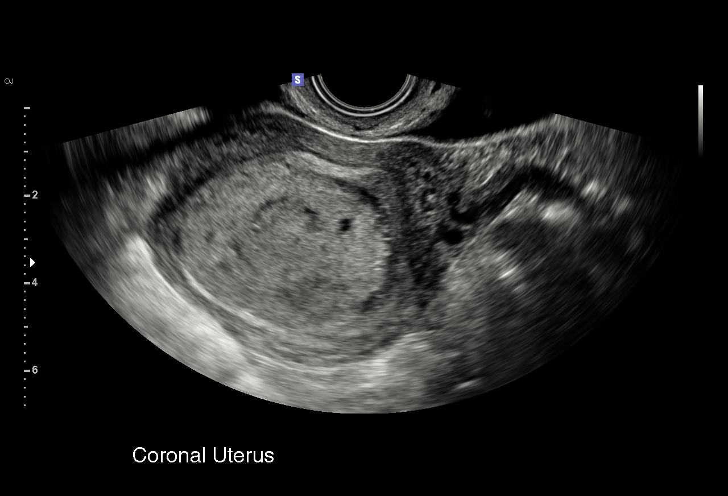
[im 26/42]
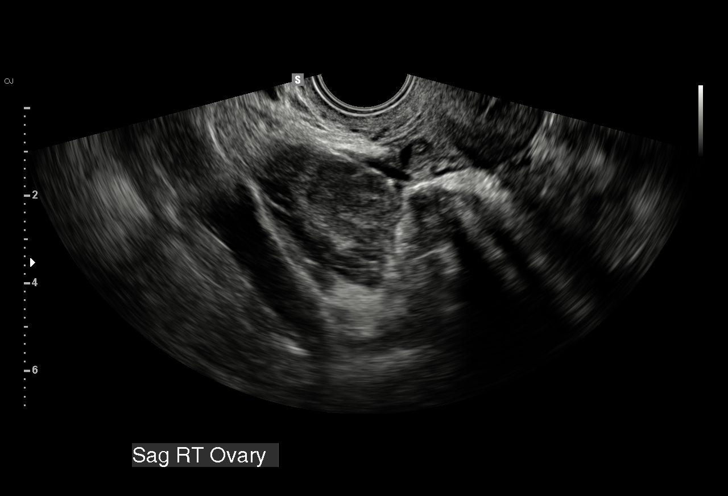
[im 29/42]
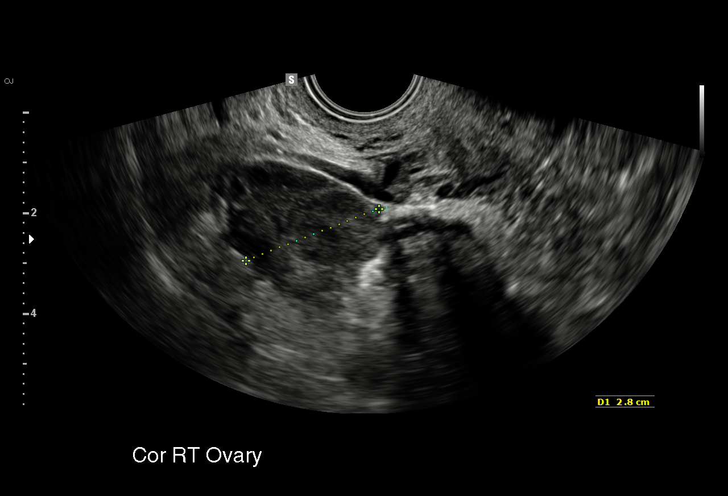
[im 32/42]
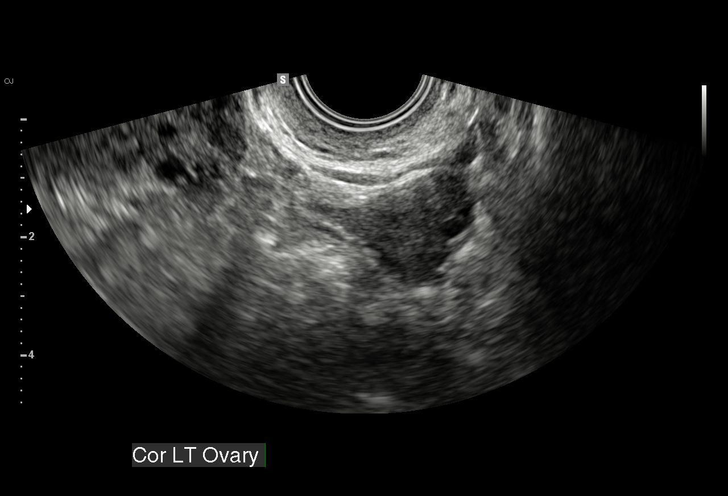
[im 35/42]
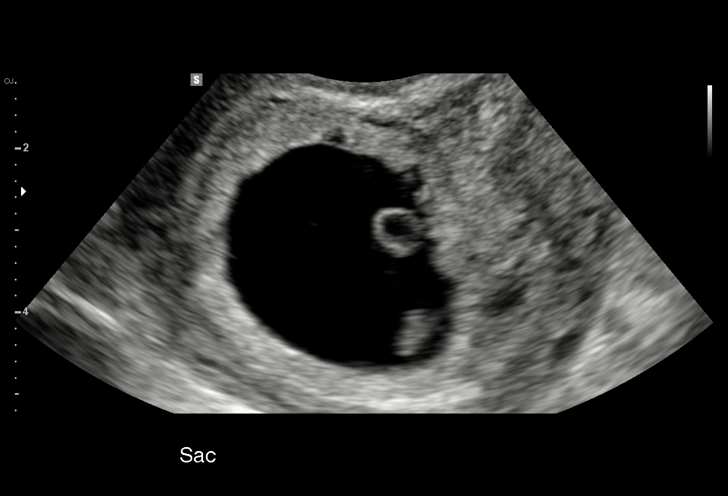
[im 38/42]
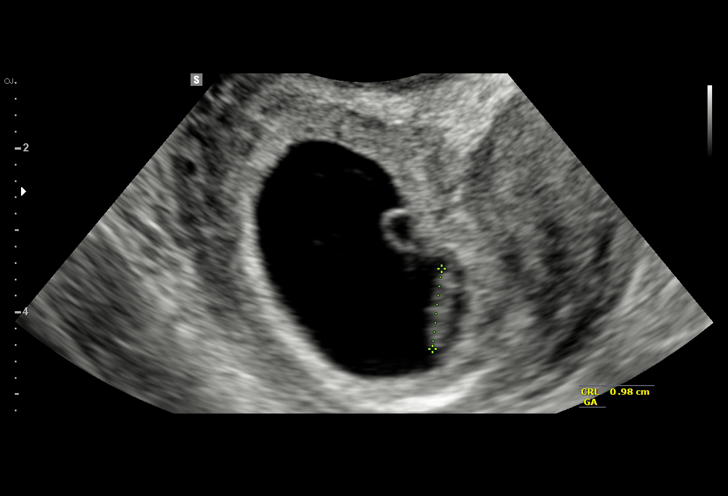
[im 42/42]
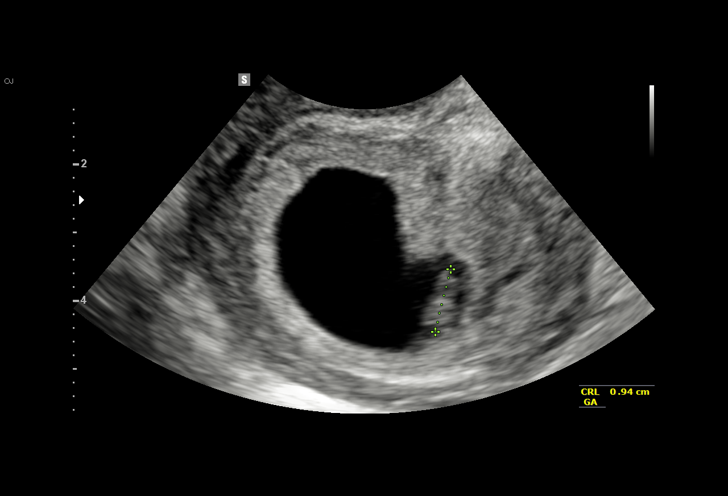

[15 of 28 positions shown; findings below may reference images not displayed]

FINDINGS: Intrauterine gestational sac: Single

Yolk sac:  Present

Embryo:  Present

Cardiac Activity: Present

Heart Rate: 126 bpm

CRL:   10  mm   7 w 1d                  US EDC: 12/05/2016

Subchorionic hemorrhage:  None visualized.

Maternal uterus/adnexae: No adnexal mass.  No pelvic free fluid.
IMPRESSION: Single live intrauterine pregnancy as described above.

## 2018-01-29 ENCOUNTER — Ambulatory Visit (HOSPITAL_COMMUNITY): Payer: Medicaid Other | Admitting: Licensed Clinical Social Worker

## 2018-02-23 ENCOUNTER — Telehealth (HOSPITAL_COMMUNITY): Payer: Self-pay | Admitting: Psychiatry

## 2018-02-23 NOTE — Telephone Encounter (Signed)
LVM per provider: Patient no showed at the last visit. Please remind the patient to make follow up appointment unless she is sees other  provider for mental condition.

## 2018-02-23 NOTE — Telephone Encounter (Signed)
Patient no showed at the last visit. Please remind the patient to make follow up appointment unless she is sees other  provider for mental condition.

## 2018-03-19 ENCOUNTER — Encounter (HOSPITAL_COMMUNITY): Payer: Self-pay | Admitting: Psychiatry

## 2018-06-28 ENCOUNTER — Emergency Department (HOSPITAL_BASED_OUTPATIENT_CLINIC_OR_DEPARTMENT_OTHER)
Admission: EM | Admit: 2018-06-28 | Discharge: 2018-06-28 | Disposition: A | Discharge status: Home / Self Care | Payer: Medicaid Other | Attending: Emergency Medicine | Admitting: Emergency Medicine

## 2018-06-28 ENCOUNTER — Other Ambulatory Visit: Payer: Self-pay

## 2018-06-28 ENCOUNTER — Encounter (HOSPITAL_BASED_OUTPATIENT_CLINIC_OR_DEPARTMENT_OTHER): Payer: Self-pay

## 2018-06-28 DIAGNOSIS — H6692 Otitis media, unspecified, left ear: Secondary | ICD-10-CM | POA: Insufficient documentation

## 2018-06-28 DIAGNOSIS — H60503 Unspecified acute noninfective otitis externa, bilateral: Secondary | ICD-10-CM

## 2018-06-28 DIAGNOSIS — J45909 Unspecified asthma, uncomplicated: Secondary | ICD-10-CM | POA: Insufficient documentation

## 2018-06-28 DIAGNOSIS — F17210 Nicotine dependence, cigarettes, uncomplicated: Secondary | ICD-10-CM | POA: Insufficient documentation

## 2018-06-28 DIAGNOSIS — Z79899 Other long term (current) drug therapy: Secondary | ICD-10-CM | POA: Insufficient documentation

## 2018-06-28 DIAGNOSIS — H60513 Acute actinic otitis externa, bilateral: Secondary | ICD-10-CM | POA: Diagnosis not present

## 2018-06-28 DIAGNOSIS — H9210 Otorrhea, unspecified ear: Secondary | ICD-10-CM | POA: Diagnosis present

## 2018-06-28 MED ORDER — AMOXICILLIN 500 MG PO TABS: 1000.0000 mg | ORAL_TABLET | Freq: Two times a day (BID) | ORAL | 0 refills | Status: AC

## 2018-06-28 MED ORDER — OFLOXACIN 0.3 % OT SOLN: 5.0000 [drp] | Freq: Two times a day (BID) | OTIC | 0 refills | Status: AC

## 2018-06-28 MED ORDER — AMOXICILLIN 500 MG PO CAPS
1000.0000 mg | ORAL_CAPSULE | Freq: Once | ORAL | Status: AC
  Administered 2018-06-28: 1000 mg via ORAL
  Filled 2018-06-28: qty 2

## 2018-06-28 MED ORDER — OFLOXACIN 0.3 % OP SOLN
5.0000 [drp] | Freq: Once | OPHTHALMIC | Status: AC
  Administered 2018-06-28: 5 [drp] via OTIC
  Filled 2018-06-28: qty 5

## 2018-06-28 NOTE — ED Provider Notes (Signed)
[facility] EMERGENCY DEPARTMENT Provider Note   CSN: [id number] Arrival date & time: 06/28/18  2122   History   Chief Complaint Chief Complaint  Patient presents with  . Ear Drainage    HPI Amy Little is a 27 y.o. female.  HPI Patient with ~1wk of left ear pain that she says is consistent with prior ear infections.  She has had a small amount of blood on one qtip that she has stuck in her ear during this and she is expressing some hearing changes.  She has a bit of pain in her ear when chewing.  Her right ear does not hurt.  She denies fever/rashes/trouble swallowing/visual changes, trouble moving neck/SOB  She has a significant hx of ear infections, ruptured ear drums, tympanostomy etc but is not following with ENT   Past Medical History:  Diagnosis Date  . Asthma   . Bipolar disorder (HCC)   . Depression   . Vaginal Pap smear, abnormal     Patient Active Problem List   Diagnosis Date Noted  . MDD (major depressive disorder), recurrent episode, moderate (HCC) 12/20/2017  . Postpartum care following vaginal delivery 12/28/2016  . Asthma 10/25/2016  . History of herpes labialis 10/25/2016  . ASCUS with positive high risk HPV cervical pap smear 05/06/2016    Past Surgical History:  Procedure Laterality Date  . WISDOM TOOTH EXTRACTION       OB History    Gravida  3   Para  2   Term  2   Preterm      AB  1   Living  2     SAB  1   TAB      Ectopic      Multiple  0   Live Births  2            Home Medications    Prior to Admission medications   Medication Sig Start Date End Date Taking? Authorizing Provider  acetaminophen (TYLENOL) 500 MG tablet Take 500 mg by mouth every 6 (six) hours as needed for headache.     [provider]  albuterol (PROVENTIL HFA;VENTOLIN HFA) 108 (90 Base) MCG/ACT inhaler Inhale 2 puffs into the lungs every 6 (six) hours as needed for wheezing or shortness of breath.    [provider]   amoxicillin (AMOXIL) 500 MG tablet Take 2 tablets (1,000 mg total) by mouth 2 (two) times daily for 7 days. 06/28/18 07/05/18  Marthenia RollingBland, Estrellita Lasky, DO  beclomethasone (QVAR) 40 MCG/ACT inhaler Inhale 2 puffs into the lungs 2 (two) times daily. Patient not taking: Reported on 11/03/2016 10/25/16   Orvilla [name], [name], CNM  Beclomethasone Diprop HFA (QVAR REDIHALER) 40 MCG/ACT AERB Inhale 2 puffs into the lungs daily. Patient not taking: Reported on 01/16/2017 12/06/16   Orvilla [name], [name], CNM  ibuprofen (ADVIL,MOTRIN) 600 MG tablet Take 1 tablet (600 mg total) by mouth every 6 (six) hours. Patient not taking: Reported on 01/16/2017 12/02/16   Allie [name], [name], MD  ofloxacin (FLOXIN) 0.3 % OTIC solution Place 5 drops into both ears 2 (two) times daily for 7 days. 06/28/18 07/05/18  Marthenia RollingBland, Daxten Kovalenko, DO  Prenatal Vit-Fe Fumarate-FA (PRENATAL MULTIVITAMIN) TABS tablet Take 1 tablet by mouth at bedtime.     [provider]  sertraline (ZOLOFT) 50 MG tablet Start 25 mg at night for one week, then 50 mg at night 12/20/17   Hisada, Barbee Cougheina, MD  valACYclovir (VALTREX) 500 MG tablet Take 1 tablet (500 mg  total) by mouth daily. Patient not taking: Reported on 12/20/2017 10/25/16   [name], CNM    Family History Family History  Problem Relation Age of Onset  . Thyroid disease Mother   . Depression Mother   . Anxiety disorder Mother   . ADD / ADHD Mother   . COPD Father   . Cancer Maternal Grandfather   . COPD Paternal Grandmother     Social History Social History   Tobacco Use  . Smoking status: Current Every Day Smoker    Packs/day: 1.00  . Smokeless tobacco: Never Used  Substance Use Topics  . Alcohol use: No    Comment: 1 beer/wine ocassionally  . Drug use: No     Allergies   Patient has no known allergies.   Review of Systems Review of Systems  Constitutional: Negative for activity change, appetite change, chills and fever.  HENT: Positive for ear discharge, ear pain and hearing loss.  Negative for drooling, facial swelling, mouth sores, rhinorrhea, sinus pressure, sinus pain, sneezing, sore throat, tinnitus, trouble swallowing and voice change.        Symptoms on left  Eyes: Negative for pain, discharge and visual disturbance.  Respiratory: Negative for cough and shortness of breath.   Cardiovascular: Negative for chest pain.  Gastrointestinal: Negative for constipation and diarrhea.  Genitourinary: Negative for difficulty urinating.  Musculoskeletal: Negative.   Skin: Negative.   Neurological: Negative for light-headedness and headaches.     Physical Exam Updated Vital Signs BP 124/78 (BP Location: Left Arm)   Pulse 80   Temp 97.8 F (36.6 C) (Oral)   Resp 16   Ht 5\' 7"  (1.702 m)   Wt 69.4 kg   LMP 06/17/2018   SpO2 99%   BMI 23.96 kg/m   Physical Exam  HENT:  Head: Normocephalic and atraumatic.  Nose: Nose normal.  Mouth/Throat: Oropharynx is clear and moist. No oropharyngeal exudate.  Right ear with irritation in canal and white scarring, left ears with purulence in canal and potential rupture (acute vs chronic).  Uvula midline, no LAD, tonsils not swollen, no drooling, no concern for airway on exam.  No mastoiditis  Neck: Normal range of motion. No tracheal deviation present.  Cardiovascular: Normal rate and regular rhythm.  No murmur heard. Pulmonary/Chest: Effort normal and breath sounds normal. She has no wheezes.  Lymphadenopathy:    She has no cervical adenopathy.  Neurological: She is alert.  Skin: Skin is warm and dry. No rash noted. No erythema.  Psychiatric: She has a normal mood and affect. Her behavior is normal. Judgment and thought content normal.     ED Treatments / Results  Labs (all labs ordered are listed, but only abnormal results are displayed) Labs Reviewed - No data to display  EKG None  Radiology No results found.  Procedures Procedures (including critical care time)  Medications Ordered in ED Medications    amoxicillin (AMOXIL) capsule 1,000 mg (1,000 mg Oral Given 06/28/18 2222)  ofloxacin (OCUFLOX) 0.3 % ophthalmic solution 5 drop (5 drops Both EARS Given 06/28/18 2223)     Initial Impression / Assessment and Plan / ED Course  I have reviewed the triage vital signs and the nursing notes.  Pertinent labs & imaging results that were available during my care of the patient were reviewed by me and considered in my medical decision making (see chart for details).   Ear exam signficiant for left otitis media and potential rupture with bilateral otitis externa.  Otherwise  will appearing with no indication of airway concern or mastoiditis  Rx otic drops bilaterally and amoxicillin, patient aware and agrees with plan.  Advised to f/u with pcp and establish with ENT    Final Clinical Impressions(s) / ED Diagnoses   Final diagnoses:  Left otitis media, unspecified otitis media type  Acute otitis externa of both ears, unspecified type    ED Discharge Orders         Ordered    ofloxacin (FLOXIN) 0.3 % OTIC solution  2 times daily     06/28/18 2212    amoxicillin (AMOXIL) 500 MG tablet  2 times daily     06/28/18 2212           Marthenia Rolling, DO 06/28/18 2224    Melene Plan, DO 06/28/18 2245

## 2018-06-28 NOTE — ED Triage Notes (Signed)
Pt c/o left ear pain for two days, started bleeding earlier today and now she cannot hear out of it and her left jaw and throat hurt, no meds prior to arrival

## 2018-06-28 NOTE — Discharge Instructions (Signed)
It appears that you have an ear infection in your left ear and a ear canal infection both.  Please use the drops we are prescribing in both ears. Also please take the tablets prescribed to help your left ear infection.  We also want to caution against sticking things like q-tips in your ear.  You should try to keep your ears dry and can use wax ear plugs for this.  We have also given you the contact number of a ear/nose/throat specialist because you have recurring problems with this.

## 2018-08-04 ENCOUNTER — Emergency Department (HOSPITAL_BASED_OUTPATIENT_CLINIC_OR_DEPARTMENT_OTHER)
Admission: EM | Admit: 2018-08-04 | Discharge: 2018-08-04 | Disposition: A | Discharge status: Home / Self Care | Payer: Medicaid Other | Attending: Emergency Medicine | Admitting: Emergency Medicine

## 2018-08-04 ENCOUNTER — Emergency Department (HOSPITAL_BASED_OUTPATIENT_CLINIC_OR_DEPARTMENT_OTHER): Payer: Medicaid Other

## 2018-08-04 ENCOUNTER — Other Ambulatory Visit: Payer: Self-pay

## 2018-08-04 ENCOUNTER — Encounter (HOSPITAL_BASED_OUTPATIENT_CLINIC_OR_DEPARTMENT_OTHER): Payer: Self-pay | Admitting: Emergency Medicine

## 2018-08-04 DIAGNOSIS — R05 Cough: Secondary | ICD-10-CM | POA: Diagnosis present

## 2018-08-04 DIAGNOSIS — Z79899 Other long term (current) drug therapy: Secondary | ICD-10-CM | POA: Diagnosis not present

## 2018-08-04 DIAGNOSIS — J45901 Unspecified asthma with (acute) exacerbation: Secondary | ICD-10-CM

## 2018-08-04 DIAGNOSIS — F17200 Nicotine dependence, unspecified, uncomplicated: Secondary | ICD-10-CM | POA: Insufficient documentation

## 2018-08-04 MED ORDER — ALBUTEROL SULFATE (2.5 MG/3ML) 0.083% IN NEBU
5.0000 mg | INHALATION_SOLUTION | Freq: Once | RESPIRATORY_TRACT | Status: AC
  Administered 2018-08-04: 5 mg via RESPIRATORY_TRACT
  Filled 2018-08-04: qty 6

## 2018-08-04 MED ORDER — METHYLPREDNISOLONE SODIUM SUCC 125 MG IJ SOLR
125.0000 mg | Freq: Once | INTRAMUSCULAR | Status: AC
  Administered 2018-08-04: 125 mg via INTRAMUSCULAR
  Filled 2018-08-04: qty 2

## 2018-08-04 MED ORDER — ACETAMINOPHEN 500 MG PO TABS
1000.0000 mg | ORAL_TABLET | Freq: Once | ORAL | Status: AC
  Administered 2018-08-04: 1000 mg via ORAL
  Filled 2018-08-04: qty 2

## 2018-08-04 MED ORDER — ALBUTEROL SULFATE (2.5 MG/3ML) 0.083% IN NEBU: 2.5000 mg | INHALATION_SOLUTION | Freq: Four times a day (QID) | RESPIRATORY_TRACT | 0 refills | Status: AC | PRN

## 2018-08-04 MED ORDER — ALBUTEROL SULFATE HFA 108 (90 BASE) MCG/ACT IN AERS
1.0000 | INHALATION_SPRAY | RESPIRATORY_TRACT | Status: DC | PRN
  Administered 2018-08-04: 2 via RESPIRATORY_TRACT
  Filled 2018-08-04: qty 6.7

## 2018-08-04 MED ORDER — PREDNISONE 20 MG PO TABS: ORAL_TABLET | ORAL | 0 refills | Status: DC

## 2018-08-04 MED ORDER — ALBUTEROL (5 MG/ML) CONTINUOUS INHALATION SOLN
10.0000 mg/h | INHALATION_SOLUTION | RESPIRATORY_TRACT | Status: DC
  Administered 2018-08-04: 10 mg/h via RESPIRATORY_TRACT
  Filled 2018-08-04: qty 20

## 2018-08-04 NOTE — ED Notes (Signed)
Pt given Rx x 2 for prednisone and albuterol

## 2018-08-04 NOTE — ED Provider Notes (Signed)
[facility] EMERGENCY DEPARTMENT Provider Note   CSN: [id number] Arrival date & time: 08/04/18  2051     History   Chief Complaint Chief Complaint  Patient presents with  . Cough    HPI Amy Little is a 27 y.o. female.  HPI Patient has history of asthma.  States for the past 2 days she is been having shortness of breath associated with nonproductive cough.  She is used her inhaler multiple times with only temporary relief.  She has had subjective fevers and chills.  No known sick contacts. Past Medical History:  Diagnosis Date  . Asthma   . Bipolar disorder (HCC)   . Depression   . Vaginal Pap smear, abnormal     Patient Active Problem List   Diagnosis Date Noted  . MDD (major depressive disorder), recurrent episode, moderate (HCC) 12/20/2017  . Postpartum care following vaginal delivery 12/28/2016  . Asthma 10/25/2016  . History of herpes labialis 10/25/2016  . ASCUS with positive high risk HPV cervical pap smear 05/06/2016    Past Surgical History:  Procedure Laterality Date  . WISDOM TOOTH EXTRACTION       OB History    Gravida  3   Para  2   Term  2   Preterm      AB  1   Living  2     SAB  1   TAB      Ectopic      Multiple  0   Live Births  2            Home Medications    Prior to Admission medications   Medication Sig Start Date End Date Taking? Authorizing Provider  acetaminophen (TYLENOL) 500 MG tablet Take 500 mg by mouth every 6 (six) hours as needed for headache.     [provider]  albuterol (PROVENTIL HFA;VENTOLIN HFA) 108 (90 Base) MCG/ACT inhaler Inhale 2 puffs into the lungs every 6 (six) hours as needed for wheezing or shortness of breath.    [provider]  albuterol (PROVENTIL) (2.5 MG/3ML) 0.083% nebulizer solution Take 3 mLs (2.5 mg total) by nebulization every 6 (six) hours as needed for wheezing or shortness of breath. 08/04/18   Loren Racer, MD  beclomethasone (QVAR) 40  MCG/ACT inhaler Inhale 2 puffs into the lungs 2 (two) times daily. Patient not taking: Reported on 11/03/2016 10/25/16   Orvilla Cornwall A, CNM  Beclomethasone Diprop HFA (QVAR REDIHALER) 40 MCG/ACT AERB Inhale 2 puffs into the lungs daily. Patient not taking: Reported on 01/16/2017 12/06/16   Orvilla Cornwall A, CNM  ibuprofen (ADVIL,MOTRIN) 600 MG tablet Take 1 tablet (600 mg total) by mouth every 6 (six) hours. Patient not taking: Reported on 01/16/2017 12/02/16   Allie Bossier, MD  predniSONE (DELTASONE) 20 MG tablet 3 tabs po day one, then 2 po daily x 4 days 08/05/18   Loren Racer, MD  Prenatal Vit-Fe Fumarate-FA (PRENATAL MULTIVITAMIN) TABS tablet Take 1 tablet by mouth at bedtime.     [provider]  sertraline (ZOLOFT) 50 MG tablet Start 25 mg at night for one week, then 50 mg at night 12/20/17   Hisada, Barbee Cough, MD  valACYclovir (VALTREX) 500 MG tablet Take 1 tablet (500 mg total) by mouth daily. Patient not taking: Reported on 12/20/2017 10/25/16   [name], CNM    Family History Family History  Problem Relation Age of Onset  . Thyroid disease Mother   . Depression Mother   .  Anxiety disorder Mother   . ADD / ADHD Mother   . COPD Father   . Cancer Maternal Grandfather   . COPD Paternal Grandmother     Social History Social History   Tobacco Use  . Smoking status: Current Every Day Smoker    Packs/day: 1.00  . Smokeless tobacco: Never Used  Substance Use Topics  . Alcohol use: No    Comment: 1 beer/wine ocassionally  . Drug use: No     Allergies   Patient has no known allergies.   Review of Systems Review of Systems  Constitutional: Positive for chills, fatigue and fever.  HENT: Positive for congestion and rhinorrhea. Negative for sore throat and trouble swallowing.   Respiratory: Positive for cough, shortness of breath and wheezing.   Cardiovascular: Negative for chest pain, palpitations and leg swelling.  Gastrointestinal: Negative for  abdominal pain, constipation, diarrhea, nausea and vomiting.  Genitourinary: Negative for dysuria, flank pain and frequency.  Musculoskeletal: Negative for back pain, myalgias and neck pain.  Skin: Negative for rash and wound.  Neurological: Negative for dizziness, weakness, light-headedness, numbness and headaches.  All other systems reviewed and are negative.    Physical Exam Updated Vital Signs BP 135/72 (BP Location: Right Arm)   Pulse (!) 106   Temp 98.5 F (36.9 C) (Oral)   Resp 18   Ht 5\' 7"  (1.702 m)   Wt 71.7 kg   LMP 07/17/2018   SpO2 99%   BMI 24.75 kg/m   Physical Exam  Constitutional: She is oriented to person, place, and time. She appears well-developed and well-nourished. No distress.  HENT:  Head: Normocephalic and atraumatic.  Mouth/Throat: Oropharynx is clear and moist. No oropharyngeal exudate.  Nasal congestion.  Mild posterior oropharyngeal erythema.  Eyes: Pupils are equal, round, and reactive to light. EOM are normal.  Neck: Normal range of motion. Neck supple. No JVD present.  Cardiovascular: Normal rate and regular rhythm. Exam reveals no gallop and no friction rub.  No murmur heard. Pulmonary/Chest: Effort normal. She has wheezes.  Scattered wheezes throughout.  No respiratory distress.  Abdominal: Soft. Bowel sounds are normal. There is no tenderness. There is no rebound and no guarding.  Musculoskeletal: Normal range of motion. She exhibits no edema or tenderness.  No lower extremity swelling, asymmetry or tenderness.  Lymphadenopathy:    She has no cervical adenopathy.  Neurological: She is alert and oriented to person, place, and time.  Moves all extremities without focal deficit.  Sensation intact.  Skin: Skin is warm and dry. Capillary refill takes less than 2 seconds. No rash noted. She is not diaphoretic. No erythema.  Psychiatric: She has a normal mood and affect. Her behavior is normal.  Nursing note and vitals reviewed.    ED  Treatments / Results  Labs (all labs ordered are listed, but only abnormal results are displayed) Labs Reviewed - No data to display  EKG None  Radiology Dg Chest 2 View  Result Date: 08/04/2018 CLINICAL DATA:  Cough and congestion.  Shortness of breath. EXAM: CHEST - 2 VIEW COMPARISON:  None. FINDINGS: Lungs are clear. Heart size and pulmonary vascularity are normal. No adenopathy. No pneumothorax. No bone lesions. IMPRESSION: No edema or consolidation. Electronically Signed   By: Bretta Bang III M.D.   On: 08/04/2018 22:16    Procedures Procedures (including critical care time)  Medications Ordered in ED Medications  methylPREDNISolone sodium succinate (SOLU-MEDROL) 125 mg/2 mL injection 125 mg (125 mg Intramuscular Given 08/04/18 2251)  albuterol (PROVENTIL) (2.5 MG/3ML) 0.083% nebulizer solution 5 mg (5 mg Nebulization Given 08/04/18 2236)  acetaminophen (TYLENOL) tablet 1,000 mg (1,000 mg Oral Given 08/04/18 2246)     Initial Impression / Assessment and Plan / ED Course  I have reviewed the triage vital signs and the nursing notes.  Pertinent labs & imaging results that were available during my care of the patient were reviewed by me and considered in my medical decision making (see chart for details).     X-ray without evidence of pneumonia.  Will treat with nebulized treatment and steroids. She does feel better after second nebulizer treatment.  We will give short course of steroids and have given her an inhaler in the emergency department.  Return precautions given. Final Clinical Impressions(s) / ED Diagnoses   Final diagnoses:  Exacerbation of asthma, unspecified asthma severity, unspecified whether persistent    ED Discharge Orders         Ordered    predniSONE (DELTASONE) 20 MG tablet     08/04/18 2315    albuterol (PROVENTIL) (2.5 MG/3ML) 0.083% nebulizer solution  Every 6 hours PRN     08/04/18 2315           Loren Racer, MD 08/05/18  [phone]

## 2018-08-04 NOTE — ED Notes (Signed)
ED Provider at bedside. 

## 2018-08-04 NOTE — ED Triage Notes (Signed)
Patient states that she has asthma and had had a "fullness" in her chest all day that is causing her to be SOB - she has used her whole inhaler and multiple nebulizer today to help with her SOB  - she is also making herself cough to try to get the stuff out of her chest

## 2019-02-03 ENCOUNTER — Encounter (HOSPITAL_BASED_OUTPATIENT_CLINIC_OR_DEPARTMENT_OTHER): Payer: Self-pay | Admitting: *Deleted

## 2019-02-03 ENCOUNTER — Emergency Department (HOSPITAL_BASED_OUTPATIENT_CLINIC_OR_DEPARTMENT_OTHER)
Admission: EM | Admit: 2019-02-03 | Discharge: 2019-02-03 | Disposition: A | Discharge status: Home / Self Care | Payer: Medicaid Other | Attending: Emergency Medicine | Admitting: Emergency Medicine

## 2019-02-03 ENCOUNTER — Other Ambulatory Visit: Payer: Self-pay

## 2019-02-03 DIAGNOSIS — R0789 Other chest pain: Secondary | ICD-10-CM | POA: Diagnosis present

## 2019-02-03 DIAGNOSIS — Z79899 Other long term (current) drug therapy: Secondary | ICD-10-CM | POA: Insufficient documentation

## 2019-02-03 DIAGNOSIS — J9801 Acute bronchospasm: Secondary | ICD-10-CM

## 2019-02-03 DIAGNOSIS — F17200 Nicotine dependence, unspecified, uncomplicated: Secondary | ICD-10-CM | POA: Diagnosis not present

## 2019-02-03 DIAGNOSIS — J45909 Unspecified asthma, uncomplicated: Secondary | ICD-10-CM | POA: Diagnosis not present

## 2019-02-03 HISTORY — DX: Herpesviral infection of urogenital system, unspecified: A60.00

## 2019-02-03 HISTORY — DX: Papillomavirus as the cause of diseases classified elsewhere: B97.7

## 2019-02-03 MED ORDER — METHYLPREDNISOLONE SODIUM SUCC 125 MG IJ SOLR
125.0000 mg | Freq: Once | INTRAMUSCULAR | Status: AC
  Administered 2019-02-03: 02:00:00 125 mg via INTRAMUSCULAR
  Filled 2019-02-03: qty 2

## 2019-02-03 NOTE — ED Notes (Signed)
ED Provider at bedside. 

## 2019-02-03 NOTE — ED Provider Notes (Signed)
[facility] Provider Note: Amy Little [name], MD, FACEP  CSN: [id number] MRN: [id number] ARRIVAL: 02/03/19 at 0102 ROOM: MH09/[facility]09   CHIEF COMPLAINT  Chest Pain   HISTORY OF PRESENT ILLNESS  02/03/19 1:14 AM Amy Little is a 28 y.o. female with a history of asthma.  She has had 3 episodes this week of shortness of breath that took multiple neb treatments to resolve.  Most recently she had 6 neb treatments yesterday evening due to an exacerbation.  She also found that her inhaler without but got a new inhaler earlier this week.  Her breathing is now improved but she is complaining of chest soreness when she breathes.  She states it feels like the insides of her lungs hurt but only rates the pain as a 1 out of 10.  She denies fever or persistent cough.  She thinks she may need a steroid shot.   Past Medical History:  Diagnosis Date  . Asthma   . Bipolar disorder (HCC)   . Depression   . Herpes genitalia   . HPV in female   . Vaginal Pap smear, abnormal     Past Surgical History:  Procedure Laterality Date  . WISDOM TOOTH EXTRACTION      Family History  Problem Relation Age of Onset  . Thyroid disease Mother   . Depression Mother   . Anxiety disorder Mother   . ADD / ADHD Mother   . COPD Father   . Cancer Maternal Grandfather   . COPD Paternal Grandmother     Social History   Tobacco Use  . Smoking status: Current Every Day Smoker    Packs/day: 1.00  . Smokeless tobacco: Never Used  Substance Use Topics  . Alcohol use: No    Comment: 1 beer/wine ocassionally  . Drug use: No    Prior to Admission medications   Medication Sig Start Date End Date Taking? Authorizing Provider  cetirizine (ZYRTEC) 10 MG tablet Take 10 mg by mouth daily.   Yes [provider]  acetaminophen (TYLENOL) 500 MG tablet Take 500 mg by mouth every 6 (six) hours as needed for headache.     [provider]  albuterol (PROVENTIL HFA;VENTOLIN HFA) 108 (90 Base)  MCG/ACT inhaler Inhale 2 puffs into the lungs every 6 (six) hours as needed for wheezing or shortness of breath.    [provider]  albuterol (PROVENTIL) (2.5 MG/3ML) 0.083% nebulizer solution Take 3 mLs (2.5 mg total) by nebulization every 6 (six) hours as needed for wheezing or shortness of breath. 08/04/18   Loren RacerYelverton, [name], MD  beclomethasone (QVAR) 40 MCG/ACT inhaler Inhale 2 puffs into the lungs 2 (two) times daily. Patient not taking: Reported on 11/03/2016 10/25/16   Orvilla [name], [name], CNM  Beclomethasone Diprop HFA (QVAR REDIHALER) 40 MCG/ACT AERB Inhale 2 puffs into the lungs daily. Patient not taking: Reported on 01/16/2017 12/06/16   Orvilla [name], [name], CNM  ibuprofen (ADVIL,MOTRIN) 600 MG tablet Take 1 tablet (600 mg total) by mouth every 6 (six) hours. Patient not taking: Reported on 01/16/2017 12/02/16   Allie [name], [name], MD  predniSONE (DELTASONE) 20 MG tablet 3 tabs po day one, then 2 po daily x 4 days 08/05/18   Loren RacerYelverton, [name], MD  Prenatal Vit-Fe Fumarate-FA (PRENATAL MULTIVITAMIN) TABS tablet Take 1 tablet by mouth at bedtime.     [provider]  sertraline (ZOLOFT) 50 MG tablet Start 25 mg at night for one week, then 50 mg at night 12/20/17  [name], MD  valACYclovir (VALTREX) 500 MG tablet Take 1 tablet (500 mg total) by mouth daily. Patient not taking: Reported on 12/20/2017 10/25/16   [name], CNM    Allergies Patient has no known allergies.   REVIEW OF SYSTEMS  Negative except as noted here or in the History of Present Illness.   PHYSICAL EXAMINATION  Initial Vital Signs Blood pressure 131/86, pulse 100, temperature 98.8 F (37.1 C), temperature source Oral, resp. rate 12, height 5\' 7"  (1.702 m), weight 81.6 kg, last menstrual period 01/31/2019, SpO2 100 %.  Examination General: Well-developed, well-nourished female in no acute distress; appearance consistent with age of record HENT: normocephalic; atraumatic Eyes: pupils equal, round  and reactive to light; extraocular muscles intact Neck: supple Heart: regular rate and rhythm Lungs: clear to auscultation bilaterally Chest: Nontender Abdomen: soft; nondistended; nontender; bowel sounds present Extremities: No deformity; full range of motion; pulses normal Neurologic: Awake, alert and oriented; motor function intact in all extremities and symmetric; no facial droop Skin: Warm and dry Psychiatric: Normal mood and affect   RESULTS  Summary of this visit's results, reviewed by myself:   EKG Interpretation  Date/Time:    Ventricular Rate:    PR Interval:    QRS Duration:   QT Interval:    QTC Calculation:   R Axis:     Text Interpretation:        Laboratory Studies: No results found for this or any previous visit (from the past 24 hour(s)). Imaging Studies: No results found.  ED COURSE and MDM  Nursing notes and initial vitals signs, including pulse oximetry, reviewed.  Vitals:   02/03/19 0110 02/03/19 0112 02/03/19 0113  BP: 131/86  131/86  Pulse: (!) 102  100  Resp: 14  12  Temp:   98.8 F (37.1 C)  TempSrc:   Oral  SpO2: 100%  100%  Weight:  81.6 kg   Height:  5\' 7"  (1.702 m)    2:09 AM Patient given injection of Solu-Medrol intramuscularly.  She has not required any bronchodilators in the ED.  She states she is ready to go home.  PROCEDURES    ED DIAGNOSES     ICD-10-CM   1. Chest discomfort R07.89   2. Acute bronchospasm J98.01        [name], Jonny Ruiz, MD 02/03/19 [phone]

## 2019-02-03 NOTE — ED Triage Notes (Signed)
C/o "breathing issues" that started tonight. C/o chest tightness and feeling like she can't take a deep breath. Hx of asthma. Denies any fevers. Denies sob. Denies being around anyone that has been sick. Took both the nebulizer and inhaler at home. Last dose 30 min ago with little relief.

## 2019-06-24 DIAGNOSIS — F411 Generalized anxiety disorder: Secondary | ICD-10-CM | POA: Insufficient documentation

## 2019-08-11 ENCOUNTER — Encounter (HOSPITAL_BASED_OUTPATIENT_CLINIC_OR_DEPARTMENT_OTHER): Payer: Self-pay

## 2019-08-11 ENCOUNTER — Emergency Department (HOSPITAL_BASED_OUTPATIENT_CLINIC_OR_DEPARTMENT_OTHER)
Admission: EM | Admit: 2019-08-11 | Discharge: 2019-08-11 | Disposition: A | Discharge status: Home / Self Care | Payer: Medicaid Other | Attending: Emergency Medicine | Admitting: Emergency Medicine

## 2019-08-11 ENCOUNTER — Other Ambulatory Visit: Payer: Self-pay

## 2019-08-11 DIAGNOSIS — J45909 Unspecified asthma, uncomplicated: Secondary | ICD-10-CM | POA: Diagnosis not present

## 2019-08-11 DIAGNOSIS — F17200 Nicotine dependence, unspecified, uncomplicated: Secondary | ICD-10-CM | POA: Insufficient documentation

## 2019-08-11 DIAGNOSIS — H9202 Otalgia, left ear: Secondary | ICD-10-CM | POA: Diagnosis present

## 2019-08-11 DIAGNOSIS — H60392 Other infective otitis externa, left ear: Secondary | ICD-10-CM | POA: Insufficient documentation

## 2019-08-11 DIAGNOSIS — Z79899 Other long term (current) drug therapy: Secondary | ICD-10-CM | POA: Insufficient documentation

## 2019-08-11 MED ORDER — HYDROCODONE-ACETAMINOPHEN 5-325 MG PO TABS: 1.0000 | ORAL_TABLET | Freq: Four times a day (QID) | ORAL | 0 refills | Status: DC | PRN

## 2019-08-11 MED ORDER — CIPROFLOXACIN-DEXAMETHASONE 0.3-0.1 % OT SUSP
4.0000 [drp] | Freq: Once | OTIC | Status: AC
  Administered 2019-08-11: 4 [drp] via OTIC
  Filled 2019-08-11: qty 7.5

## 2019-08-11 MED ORDER — CIPROFLOXACIN HCL 500 MG PO TABS: 500.0000 mg | ORAL_TABLET | Freq: Two times a day (BID) | ORAL | 0 refills | Status: DC

## 2019-08-11 MED ORDER — HYDROCODONE-ACETAMINOPHEN 5-325 MG PO TABS
2.0000 | ORAL_TABLET | Freq: Once | ORAL | Status: DC
  Filled 2019-08-11: qty 2

## 2019-08-11 MED ORDER — CIPROFLOXACIN HCL 500 MG PO TABS
500.0000 mg | ORAL_TABLET | Freq: Once | ORAL | Status: AC
Start: 2019-08-11 — End: 2019-08-11
  Administered 2019-08-11: 500 mg via ORAL
  Filled 2019-08-11: qty 1

## 2019-08-11 MED ORDER — IBUPROFEN 800 MG PO TABS: 800.0000 mg | ORAL_TABLET | Freq: Three times a day (TID) | ORAL | 0 refills | Status: DC

## 2019-08-11 NOTE — ED Provider Notes (Addendum)
[facility] EMERGENCY DEPARTMENT Provider Note   CSN: [id number] Arrival date & time: 08/11/19  0841     History   Chief Complaint Chief Complaint  Patient presents with  . Otalgia    HPI Elisabeth Amy Little is a 28 y.o. female.     HPI Patient has had recurrent problems with ear infections on the left.  She reports that this one is worse than previously.  She reports the ear canal is very swollen and feels tight.  She reports her been yellow looking drainage that smells bad.  She reports she has pain in front of the ear and behind the ear.  This is the most severe pain she has had in association with ear infections.  No documented fever.  Patient however reports she been taking ibuprofen regularly to try to control pain.  Last dose about an hour prior to arrival.  She was seen via televisit from her PCP and started amoxicillin 875 mg twice daily.  She has been taking it for 2-1/2 days and see no improvement.  No nasal drainage sore throat or cough. Past Medical History:  Diagnosis Date  . Asthma   . Bipolar disorder (HCC)   . Depression   . Herpes genitalia   . HPV in female   . Vaginal Pap smear, abnormal     Patient Active Problem List   Diagnosis Date Noted  . MDD (major depressive disorder), recurrent episode, moderate (HCC) 12/20/2017  . Postpartum care following vaginal delivery 12/28/2016  . Asthma 10/25/2016  . History of herpes labialis 10/25/2016  . ASCUS with positive high risk HPV cervical pap smear 05/06/2016    Past Surgical History:  Procedure Laterality Date  . WISDOM TOOTH EXTRACTION       OB History    Gravida  3   Para  2   Term  2   Preterm      AB  1   Living  2     SAB  1   TAB      Ectopic      Multiple  0   Live Births  2            Home Medications    Prior to Admission medications   Medication Sig Start Date End Date Taking? Authorizing Provider  acetaminophen (TYLENOL) 500 MG tablet Take 500 mg by mouth  every 6 (six) hours as needed for headache.     [provider]  albuterol (PROVENTIL HFA;VENTOLIN HFA) 108 (90 Base) MCG/ACT inhaler Inhale 2 puffs into the lungs every 6 (six) hours as needed for wheezing or shortness of breath.    [provider]  albuterol (PROVENTIL) (2.5 MG/3ML) 0.083% nebulizer solution Take 3 mLs (2.5 mg total) by nebulization every 6 (six) hours as needed for wheezing or shortness of breath. 08/04/18   Loren RacerYelverton, [name], MD  beclomethasone (QVAR) 40 MCG/ACT inhaler Inhale 2 puffs into the lungs 2 (two) times daily. Patient not taking: Reported on 11/03/2016 10/25/16   Orvilla [name], [name], CNM  Beclomethasone Diprop HFA (QVAR REDIHALER) 40 MCG/ACT AERB Inhale 2 puffs into the lungs daily. Patient not taking: Reported on 01/16/2017 12/06/16   Orvilla [name], [name], CNM  cetirizine (ZYRTEC) 10 MG tablet Take 10 mg by mouth daily.    [provider]  ciprofloxacin (CIPRO) 500 MG tablet Take 1 tablet (500 mg total) by mouth 2 (two) times daily. One po bid x 7 days 08/11/19   Arby BarrettePfeiffer, Tullio Chausse, MD  HYDROcodone-acetaminophen (NORCO/VICODIN) 5-325 MG tablet Take 1-2 tablets by mouth every 6 (six) hours as needed for moderate pain or severe pain. 08/11/19   Charlesetta Shanks, MD  ibuprofen (ADVIL) 800 MG tablet Take 1 tablet (800 mg total) by mouth 3 (three) times daily. 08/11/19   Charlesetta Shanks, MD  predniSONE (DELTASONE) 20 MG tablet 3 tabs po day one, then 2 po daily x 4 days 08/05/18   Julianne Rice, MD  sertraline (ZOLOFT) 50 MG tablet Start 25 mg at night for one week, then 50 mg at night 12/20/17   Norman Clay, MD    Family History Family History  Problem Relation Age of Onset  . Thyroid disease Mother   . Depression Mother   . Anxiety disorder Mother   . ADD / ADHD Mother   . COPD Father   . Cancer Maternal Grandfather   . COPD Paternal Grandmother     Social History Social History   Tobacco Use  . Smoking status: Current Every Day Smoker     Packs/day: 1.00  . Smokeless tobacco: Never Used  Substance Use Topics  . Alcohol use: No    Comment: 1 beer/wine ocassionally  . Drug use: No     Allergies   Patient has no known allergies.   Review of Systems Review of Systems 10 Systems reviewed and are negative for acute change except as noted in the HPI.   Physical Exam Updated Vital Signs BP 122/82 (BP Location: Right Arm)   Pulse 92   Temp 98.4 F (36.9 C) (Oral)   Resp 16   Ht 5\' 7"  (1.702 m)   Wt 80.7 kg   SpO2 100%   BMI 27.88 kg/m   Physical Exam Constitutional:      Comments: Alert and nontoxic.  Mental status clear.  Patient is very uncomfortable.  No respiratory distress.  HENT:     Head: Normocephalic and atraumatic.     Ears:     Comments: No facial asymmetry.  Pupils are equally round and reactive without any ocular injection or periorbital edema.  Oral cavity widely patent.  No erythema or exudate of the tonsillar pillars.  No trismus.  Patient has moderate to large swelling of the left external auditory canal.  It is nearly occluded but with traction I can create an approximately 3 mm opening in the canal.  I cannot visualize the eardrum.  There is milky looking drainage on the external auditory canal walls.  No blood visible.  The pinna itself is not significantly swollen.  Patient does have tenderness at the tragus and the preauricular area as well as behind the ear.  The pinna is not displaced or protruding.  Neck is supple.  No pronounced lymphadenopathy.  Right ear canal is clear and patent.  Patient has tympanosclerosis but otherwise no acute bulging or erythema. Eyes:     Extraocular Movements: Extraocular movements intact.     Pupils: Pupils are equal, round, and reactive to light.  Neck:     Musculoskeletal: Neck supple.  Cardiovascular:     Rate and Rhythm: Normal rate and regular rhythm.  Pulmonary:     Effort: Pulmonary effort is normal.     Breath sounds: Normal breath sounds.   Abdominal:     General: There is no distension.  Musculoskeletal: Normal range of motion.  Skin:    General: Skin is warm and dry.  Neurological:     General: No focal deficit present.     Mental Status:  She is oriented to person, place, and time.     Cranial Nerves: No cranial nerve deficit.     Coordination: Coordination normal.  Psychiatric:        Mood and Affect: Mood normal.        ED Treatments / Results  Labs (all labs ordered are listed, but only abnormal results are displayed) Labs Reviewed - No data to display  EKG None  Radiology No results found.  Procedures Procedures (including critical care time)  Medications Ordered in ED Medications  HYDROcodone-acetaminophen (NORCO/VICODIN) 5-325 MG per tablet 2 tablet (2 tablets Oral Not Given 08/11/19 0924)  ciprofloxacin-dexamethasone (CIPRODEX) 0.3-0.1 % OTIC (EAR) suspension 4 drop (4 drops Left EAR Given 08/11/19 0923)  ciprofloxacin (CIPRO) tablet 500 mg (500 mg Oral Given 08/11/19 0924)     Initial Impression / Assessment and Plan / ED Course  I have reviewed the triage vital signs and the nursing notes.  Pertinent labs & imaging results that were available during my care of the patient were reviewed by me and considered in my medical decision making (see chart for details).       Patient has had recurrent problems with left ear infection.  She is not responding to oral amoxicillin.  Physical exam has otitis externa with milky, purulent drainage.  I was able to place an ear wick with good placement in the auditory canal.  Ciprodex instilled.  Patient is instructed to continue her amoxicillin, start ciprofloxacin orally and continue Ciprodex drops.  Ibuprofen and Vicodin for pain.  She is counseled that she must have follow-up with ear nose throat as she has had recurrent problems with otitis media and externa on the side.  Return precautions are reviewed.  Patient is counseled that she needs replacement of the  wick and recheck within 2 to 3 days.  Final Clinical Impressions(s) / ED Diagnoses   Final diagnoses:  Other infective acute otitis externa of left ear    ED Discharge Orders         Ordered    ibuprofen (ADVIL) 800 MG tablet  3 times daily     08/11/19 0939    HYDROcodone-acetaminophen (NORCO/VICODIN) 5-325 MG tablet  Every 6 hours PRN     08/11/19 0939    ciprofloxacin (CIPRO) 500 MG tablet  2 times daily     08/11/19 0277           Arby Barrette, MD 08/11/19 4128    Arby Barrette, MD 08/11/19 1043

## 2019-08-11 NOTE — Discharge Instructions (Addendum)
1.  You were given a eardrop called Ciprodex.  It is very important that you use this twice daily.  Shake the bottle and then put the drops on the ear wick.  If your swelling decreases and the ear wick falls out, continue to use the drops twice daily in the ear canal. 2.  Continue your prescribed amoxicillin, start taking ciprofloxacin twice daily as prescribed. 3.  Take ibuprofen 800 mg 3 times a day for pain control.  Make sure you have a little bit of food on your stomach.  You may also take 1-2 Vicodin tablets every 6 hours if needed for additional pain control. 4.  Call [facility] tomorrow morning to schedule an appointment as soon as possible.  You need to be rechecked within 2 to 3 days.  At that time, your ear wick will either need to be replaced or removed if swelling has adequately decreased. 5.  Return to the emergency department if you develop fevers, increasing swelling or redness or worsening symptoms generally.  You should start to see some  improvement in the next 24 to 48 hours.

## 2019-08-11 NOTE — ED Triage Notes (Signed)
Pt reports left ear pain since Wednesday, had video appointment with pmd, ordered amoxicillin, taking since Thursday, worsening over last few days.  Denies fevers, although has been taking ibuprofen/tylenol.  Last taken at 0800. Denies n/v.  Denies nasal congestion.

## 2019-08-11 NOTE — ED Notes (Signed)
Pt verbalized understanding of dc instructions.

## 2019-08-28 ENCOUNTER — Ambulatory Visit (INDEPENDENT_AMBULATORY_CARE_PROVIDER_SITE_OTHER): Payer: Medicaid Other | Admitting: Advanced Practice Midwife

## 2019-08-28 DIAGNOSIS — Z3480 Encounter for supervision of other normal pregnancy, unspecified trimester: Secondary | ICD-10-CM

## 2019-08-28 DIAGNOSIS — Z3201 Encounter for pregnancy test, result positive: Secondary | ICD-10-CM

## 2019-08-28 NOTE — Progress Notes (Addendum)
TELEHEALTH VIRTUAL GYNECOLOGY VISIT ENCOUNTER NOTE  I connected with Amy Little on 08/28/19 at  2:45 PM EST by telephone at home and verified that I am speaking with the correct person using two identifiers.   I discussed the limitations, risks, security and privacy concerns of performing an evaluation and management service by telephone and the availability of in person appointments. I also discussed with the patient that there may be a patient responsible charge related to this service. The patient expressed understanding and agreed to proceed.   History:  Amy Little is a 28 y.o. [phone] female being evaluated today for new OB intake.  --Had a + UPT at her PCP on 08/18/19.  Fairly sure her LMP was the last week of September, placing her at approximately [redacted] weeks pregnant with and EDC of around 04/12/2020.   --Hx 2 term SVDs w/o problems and an early SAB.  FIrst baby was 9#12oz, denies GDM  --Had an ear infection at that visit for which she was given oral cipro.  Her ear is better now.  --Had been on lexapro and klonopin for anxiety, stopped both in early November.  Plans to continue seeing her psychiatrist, made aware that she can restart her Lexapro is she and her MD decide that she needs it (discussed risks/benefits, etc of meds during pregnancy).  --Has a history of asthma for which she often uses an inhaler and/or nebulizer tx, but admittedly uses them when she feels SOB d/t anxiety. Warned against overusing. 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  NEEDS PAP --Hx genital HSV, outbreaks q 3-4 months.  Doesn't want prophylaxis until end of pg unless starts outbreaking more   She denies any abnormal vaginal discharge, bleeding, pelvic pain or other concerns.       Past Medical History:  Diagnosis Date  . Asthma   . Bipolar disorder ([place])   . Depression   . Herpes genitalia   . HPV in female   . Vaginal Pap smear, abnormal    Past Surgical History:   Procedure Laterality Date  . WISDOM TOOTH EXTRACTION     The following portions of the patient's history were reviewed and updated as appropriate: allergies, current medications, past family history, past medical history, past social history, past surgical history and problem list.     Review of Systems:  Pertinent items noted in HPI and remainder of comprehensive ROS otherwise negative.  Physical Exam:   General:  Alert, oriented and cooperative.   Mental Status: Normal mood and affect perceived. Normal judgment and thought content.  Physical exam deferred due to nature of the encounter  Labs and Imaging No results found for this or any previous visit (from the past 336 hour(s)). No results found.    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 The patient was provided an opportunity to ask questions and all were answered. The patient agreed with the plan and demonstrated an understanding of the instructions.   The patient was advised to call back or seek an in-person evaluation/go to the ED if the symptoms worsen or if the condition fails to improve as anticipated.  I provided 30 minutes of non-face-to-face time during this encounter.   Joaquim Lai  [place], [employer] for [employer], [facility]

## 2019-08-30 ENCOUNTER — Encounter: Payer: Self-pay | Admitting: Advanced Practice Midwife

## 2019-08-30 DIAGNOSIS — Z3480 Encounter for supervision of other normal pregnancy, unspecified trimester: Secondary | ICD-10-CM | POA: Insufficient documentation

## 2019-09-15 ENCOUNTER — Emergency Department (HOSPITAL_BASED_OUTPATIENT_CLINIC_OR_DEPARTMENT_OTHER)
Admission: EM | Admit: 2019-09-15 | Discharge: 2019-09-15 | Disposition: A | Discharge status: Home / Self Care | Payer: Medicaid Other | Attending: Emergency Medicine | Admitting: Emergency Medicine

## 2019-09-15 ENCOUNTER — Encounter (HOSPITAL_BASED_OUTPATIENT_CLINIC_OR_DEPARTMENT_OTHER): Payer: Self-pay | Admitting: Emergency Medicine

## 2019-09-15 ENCOUNTER — Other Ambulatory Visit: Payer: Self-pay

## 2019-09-15 ENCOUNTER — Emergency Department (HOSPITAL_BASED_OUTPATIENT_CLINIC_OR_DEPARTMENT_OTHER): Payer: Medicaid Other

## 2019-09-15 DIAGNOSIS — O209 Hemorrhage in early pregnancy, unspecified: Secondary | ICD-10-CM | POA: Insufficient documentation

## 2019-09-15 DIAGNOSIS — R109 Unspecified abdominal pain: Secondary | ICD-10-CM | POA: Insufficient documentation

## 2019-09-15 DIAGNOSIS — F17210 Nicotine dependence, cigarettes, uncomplicated: Secondary | ICD-10-CM | POA: Insufficient documentation

## 2019-09-15 DIAGNOSIS — Z3A10 10 weeks gestation of pregnancy: Secondary | ICD-10-CM | POA: Diagnosis not present

## 2019-09-15 DIAGNOSIS — Z79899 Other long term (current) drug therapy: Secondary | ICD-10-CM | POA: Diagnosis not present

## 2019-09-15 DIAGNOSIS — O26899 Other specified pregnancy related conditions, unspecified trimester: Secondary | ICD-10-CM | POA: Diagnosis present

## 2019-09-15 DIAGNOSIS — N898 Other specified noninflammatory disorders of vagina: Secondary | ICD-10-CM | POA: Insufficient documentation

## 2019-09-15 DIAGNOSIS — R102 Pelvic and perineal pain: Secondary | ICD-10-CM | POA: Diagnosis not present

## 2019-09-15 LAB — BASIC METABOLIC PANEL
Anion gap: 8 (ref 5–15)
BUN: 7 mg/dL (ref 6–20)
CO2: 24 mmol/L (ref 22–32)
Calcium: 9.2 mg/dL (ref 8.9–10.3)
Chloride: 103 mmol/L (ref 98–111)
Creatinine, Ser: 0.51 mg/dL (ref 0.44–1.00)
GFR calc Af Amer: >60 mL/min (ref >60)
GFR calc non Af Amer: >60 mL/min (ref >60)
Glucose, Bld: 101 mg/dL — ABNORMAL HIGH (ref 70–99)
Potassium: 3.7 mmol/L (ref 3.5–5.1)
Sodium: 135 mmol/L (ref 135–145)

## 2019-09-15 LAB — URINALYSIS, ROUTINE W REFLEX MICROSCOPIC
Bilirubin Urine: NEGATIVE
Glucose, UA: NEGATIVE mg/dL
Hgb urine dipstick: NEGATIVE
Ketones, ur: 40 mg/dL — AB
Nitrite: NEGATIVE
Protein, ur: NEGATIVE mg/dL
Specific Gravity, Urine: 1.02 (ref 1.005–1.030)
pH: 7 (ref 5.0–8.0)

## 2019-09-15 LAB — WET PREP, GENITAL
Clue Cells Wet Prep HPF POC: NONE SEEN
Trich, Wet Prep: NONE SEEN
Yeast Wet Prep HPF POC: NONE SEEN

## 2019-09-15 LAB — CBC
HCT: 38.2 % (ref 36.0–46.0)
Hemoglobin: 13.2 g/dL (ref 12.0–15.0)
MCH: 31.4 pg (ref 26.0–34.0)
MCHC: 34.6 g/dL (ref 30.0–36.0)
MCV: 90.7 fL (ref 80.0–100.0)
Platelets: 281 10*3/uL (ref 150–400)
RBC: 4.21 MIL/uL (ref 3.87–5.11)
RDW: 12.5 % (ref 11.5–15.5)
WBC: 7.6 10*3/uL (ref 4.0–10.5)
nRBC: 0 % (ref 0.0–0.2)

## 2019-09-15 LAB — PREGNANCY, URINE: Preg Test, Ur: POSITIVE — AB

## 2019-09-15 LAB — URINALYSIS, MICROSCOPIC (REFLEX): RBC / HPF: NONE SEEN RBC/hpf (ref 0–5)

## 2019-09-15 LAB — HCG, QUANTITATIVE, PREGNANCY: hCG, Beta Chain, Quant, S: 89914 m[IU]/mL — ABNORMAL HIGH (ref <5)

## 2019-09-15 NOTE — ED Provider Notes (Signed)
[facility] Provider Note   CSN: [id number] Arrival date & time: 09/15/19  2585     History   Chief Complaint Chief Complaint  Patient presents with   Abdominal Pain    [redacted] weeks pregnant    HPI Amy Little is a 28 y.o. female.     Pt who is [id number] --presents to the emergency department today with complaint of lower abdominal cramping and lower back pain.  Patient is approximately [redacted] weeks pregnant.  She has not yet had an ultrasound.  Initial OB appointment is in about 1 week.  Patient developed a very small amount of vaginal bleeding yesterday with the pain.  Today she has had intermittent lower abdominal and back cramps.  No medicines prior to arrival.  History of 1 spontaneous miscarriage.  She denies any fevers or urinary symptoms.  Patient has had nausea and vomiting in the past day, however this has been ongoing during her pregnancy.  Onset of symptoms acute.       Past Medical History:  Diagnosis Date   Asthma    Bipolar disorder ([place])    "years ago, not sure if it's true"   Depression    Herpes genitalia    rare outbreaks   HPV in female    Vaginal Pap smear, abnormal     Patient Active Problem List   Diagnosis Date Noted   Supervision of other normal pregnancy, antepartum 08/30/2019   MDD (major depressive disorder), recurrent episode, moderate ([place]) 12/20/2017   Asthma 10/25/2016   History of herpes labialis 10/25/2016   ASCUS with positive high risk HPV cervical pap smear 05/06/2016    Past Surgical History:  Procedure Laterality Date   WISDOM TOOTH EXTRACTION       OB History    Gravida  4   Para  2   Term  2   Preterm      AB  1   Living  2     SAB  1   TAB      Ectopic      Multiple  0   Live Births  2            Home Medications    Prior to Admission medications   Medication Sig Start Date End Date Taking? Authorizing Provider  Prenatal Vit-DSS-Fe Fum-FA  (PRENATAL 19) tablet Take by mouth. 08/13/19  Yes [provider]  valACYclovir (VALTREX) 500 MG tablet as needed. 06/27/18  Yes [provider]  acetaminophen (TYLENOL) 500 MG tablet Take 500 mg by mouth every 6 (six) hours as needed for headache.     [provider]  albuterol (PROVENTIL HFA;VENTOLIN HFA) 108 (90 Base) MCG/ACT inhaler Inhale 2 puffs into the lungs every 6 (six) hours as needed for wheezing or shortness of breath.    [provider]  albuterol (PROVENTIL) (2.5 MG/3ML) 0.083% nebulizer solution Take 3 mLs (2.5 mg total) by nebulization every 6 (six) hours as needed for wheezing or shortness of breath. 08/04/18   Julianne Rice, MD    Family History Family History  Problem Relation Age of Onset   Thyroid disease Mother    Depression Mother    Anxiety disorder Mother    ADD / ADHD Mother    COPD Father    Cancer Maternal Grandfather    COPD Paternal Grandmother     Social History Social History   Tobacco Use   Smoking status: Current Every Day  Smoker    Packs/day: 1.00   Smokeless tobacco: Never Used  Substance Use Topics   Alcohol use: No    Comment: 1 beer/wine ocassionally   Drug use: No     Allergies   Patient has no known allergies.   Review of Systems Review of Systems  Constitutional: Negative for fever.  HENT: Negative for rhinorrhea and sore throat.   Eyes: Negative for redness.  Respiratory: Negative for cough.   Cardiovascular: Negative for chest pain.  Gastrointestinal: Negative for abdominal pain, diarrhea, nausea and vomiting.  Genitourinary: Positive for pelvic pain and vaginal bleeding. Negative for dysuria.  Musculoskeletal: Positive for back pain. Negative for myalgias.  Skin: Negative for rash.  Neurological: Negative for headaches.     Physical Exam Updated Vital Signs BP 123/86 (BP Location: Left Arm)    Pulse 93    Temp 99.2 F (37.3 C) (Oral)    Resp 18    Ht 5\' 7"  (1.702 m)     Wt 84.4 kg    LMP 07/07/2019 (Within Weeks)    SpO2 100%    BMI 29.13 kg/m   Physical Exam Vitals signs and nursing note reviewed. Exam conducted with a chaperone present.  Constitutional:      Appearance: She is well-developed.  HENT:     Head: Normocephalic and atraumatic.  Eyes:     General:        Right eye: No discharge.        Left eye: No discharge.     Conjunctiva/sclera: Conjunctivae normal.  Neck:     Musculoskeletal: Normal range of motion and neck supple.  Cardiovascular:     Rate and Rhythm: Normal rate and regular rhythm.     Heart sounds: Normal heart sounds.  Pulmonary:     Effort: Pulmonary effort is normal.     Breath sounds: Normal breath sounds.  Abdominal:     Palpations: Abdomen is soft.     Tenderness: There is no abdominal tenderness.  Genitourinary:    Exam position: Lithotomy position.     Labia:        Right: No lesion.        Left: No lesion.      Vagina: Vaginal discharge present. No tenderness or bleeding.     Cervix: No cervical motion tenderness, discharge, friability or cervical bleeding.     Uterus: Not tender.      Adnexa:        Right: No mass, tenderness or fullness.         Left: No mass, tenderness or fullness.    Skin:    General: Skin is warm and dry.  Neurological:     Mental Status: She is alert.      ED Treatments / Results  Labs (all labs ordered are listed, but only abnormal results are displayed) Labs Reviewed  WET PREP, GENITAL - Abnormal; Notable for the following components:      Result Value   WBC, Wet Prep HPF POC MANY (*)    All other components within normal limits  BASIC METABOLIC PANEL - Abnormal; Notable for the following components:   Glucose, Bld 101 (*)    All other components within normal limits  HCG, QUANTITATIVE, PREGNANCY - Abnormal; Notable for the following components:   hCG, Beta Chain, Quant, S 07/09/2019 (*)    All other components within normal limits  URINALYSIS, ROUTINE W REFLEX MICROSCOPIC  - Abnormal; Notable for the following components:   APPearance HAZY (*)  Ketones, ur 40 (*)    Leukocytes,Ua TRACE (*)    All other components within normal limits  PREGNANCY, URINE - Abnormal; Notable for the following components:   Preg Test, Ur POSITIVE (*)    All other components within normal limits  URINALYSIS, MICROSCOPIC (REFLEX) - Abnormal; Notable for the following components:   Bacteria, UA MANY (*)    All other components within normal limits  CBC  GC/CHLAMYDIA PROBE AMP ([place]) NOT AT [facility]    EKG None  Radiology [place] Ob Comp < 14 Wks  Result Date: 09/15/2019 CLINICAL DATA:  Vaginal bleeding in early pregnancy. EXAM: OBSTETRIC <14 WK ULTRASOUND TECHNIQUE: Transabdominal ultrasound was performed for evaluation of the gestation as well as the maternal uterus and adnexal regions. COMPARISON:  None. FINDINGS: Intrauterine gestational sac: Single Yolk sac:  Yes Embryo:  Yes Cardiac Activity: Yes Heart Rate: 171 bpm CRL:   32.4 mm   10 w 1 d                  US EDC: 04/11/2020 Subchorionic hemorrhage:  None visualized. Maternal uterus/adnexae: Subchorionic hemorrhage: None Right ovary: Normal Left ovary: Normal Other :None Free fluid:  None IMPRESSION: 1. Single living intrauterine gestation with an estimated gestational age of [redacted] weeks and 1 day. Electronically Signed   By: Signa Kellaylor  [place] M.D.   On: 09/15/2019 12:29    Procedures Procedures (including critical care time)  Medications Ordered in ED Medications - No data to display   Initial Impression / Assessment and Plan / ED Course  I have reviewed the triage vital signs and the nursing notes.  Pertinent labs & imaging results that were available during my care of the patient were reviewed by me and considered in my medical decision making (see chart for details).        Patient seen and examined. Work-up initiated. Pt looks comfortable. No peritonitis on exam. Vitals without tachycardia or hypotension. Do not  suspect ruptured ectopic.  Will need to confirm intrauterine pregnancy and viability.  Discussed pelvic exam with patient and will proceed with this.  Vital signs reviewed and are as follows: BP 123/86 (BP Location: Left Arm)    Pulse 93    Temp 99.2 F (37.3 C) (Oral)    Resp 18    Ht 5\' 7"  (1.702 m)    Wt 84.4 kg    LMP 07/07/2019 (Within Weeks)    SpO2 100%    BMI 29.13 kg/m   10:46 AM Pelvic performed with RN chaperone. US ordered.   11:14 AM Informed previous blood typing was A+.   12:49 PM ultrasound and results reviewed.  Patient with intrauterine pregnancy at 10 weeks and 1 day.  Normal fetal heart rate.  No subchorionic hemorrhage or other problems noted.  Patient updated on results.  We discussed lab and urine testing results.  Discussed that she has some mild dehydration.  No vomiting here and feel patient can hydrate orally at home.  Otherwise, lab work is reassuring.  Encouraged that she keep her upcoming OB/GYN appointment.  We discussed signs and symptoms to return including worsening abdominal pain or cramping, more persistent or severe vaginal bleeding.  Or, if she has any other concerns.  Discussed use of heat on her back and Tylenol as needed for pain.  Final Clinical Impressions(s) / ED Diagnoses   Final diagnoses:  Vaginal bleeding in pregnancy, first trimester   Patient with vaginal bleeding and first trimester of pregnancy.  Work-up  here essentially normal.  Patient does have some mild dehydration but is not vomiting.  Do not suspect hyperemesis.  Exam is reassuring.  Ultrasounds with intrauterine pregnancy without any apparent complications to this point.  Possible threatened abortion although no bleeding currently.  Patient looks well.  She has appropriate OB GYN follow-up and we discussed precautions and return instructions.  ED Discharge Orders    None       Renne Crigler, Cordelia Poche 09/15/19 1252    Linwood Dibbles, MD 09/17/19 1432

## 2019-09-15 NOTE — Discharge Instructions (Signed)
Please read and follow all provided instructions.  Your diagnoses today include:  1. Vaginal bleeding in pregnancy, first trimester     Tests performed today include:  Ultrasound - shows pregnancy in the uterus, appearing healthy, at 10 weeks and 1 day gestation  Urine test - does not show signs of infection  Blood counts and electrolytes - are normal  Vital signs. See below for your results today.   Medications prescribed:   None  Take any prescribed medications only as directed.  Home care instructions:  Follow any educational materials contained in this packet.  You may use Tylenol for pain.  You may use heat on your back if needed.  Please make sure that you are hydrating yourself well at home and drinking water until your urine is light yellow to clear in color.  Follow-up instructions: Please follow-up with your OB/GYN as planned.  Return instructions:   Please return to the Emergency Department if you experience worsening symptoms.   Please return if you have worsening pain or begin having more significant vaginal bleeding.  Please return if you have any other emergent concerns.  Additional Information:  Your vital signs today were: BP 123/86 (BP Location: Left Arm)    Pulse 93    Temp 99.2 F (37.3 C) (Oral)    Resp 18    Ht 5\' 7"  (1.702 m)    Wt 84.4 kg    LMP 07/07/2019 (Within Weeks)    SpO2 100%    BMI 29.13 kg/m  If your blood pressure (BP) was elevated above 135/85 this visit, please have this repeated by your doctor within one month. --------------

## 2019-09-15 NOTE — ED Triage Notes (Signed)
Pt is [redacted] week pregnant. C/o abd cramping with mild bleeding since yesterday.

## 2019-09-16 LAB — GC/CHLAMYDIA PROBE AMP (~~LOC~~) NOT AT ARMC
Chlamydia: NEGATIVE
Neisseria Gonorrhea: NEGATIVE

## 2019-09-23 ENCOUNTER — Ambulatory Visit (INDEPENDENT_AMBULATORY_CARE_PROVIDER_SITE_OTHER): Payer: Medicaid Other | Admitting: Obstetrics and Gynecology

## 2019-09-23 ENCOUNTER — Other Ambulatory Visit: Payer: Self-pay

## 2019-09-23 ENCOUNTER — Encounter: Payer: Self-pay | Admitting: Obstetrics and Gynecology

## 2019-09-23 ENCOUNTER — Other Ambulatory Visit (HOSPITAL_COMMUNITY)
Admission: RE | Admit: 2019-09-23 | Discharge: 2019-09-23 | Disposition: A | Discharge status: Home / Self Care | Payer: Medicaid Other | Source: Ambulatory Visit | Attending: Obstetrics and Gynecology | Admitting: Obstetrics and Gynecology

## 2019-09-23 VITALS — BP 127/82 | HR 103 | Wt 185.3 lb

## 2019-09-23 DIAGNOSIS — Z3481 Encounter for supervision of other normal pregnancy, first trimester: Secondary | ICD-10-CM | POA: Diagnosis not present

## 2019-09-23 DIAGNOSIS — Z3480 Encounter for supervision of other normal pregnancy, unspecified trimester: Secondary | ICD-10-CM | POA: Insufficient documentation

## 2019-09-23 DIAGNOSIS — Z8619 Personal history of other infectious and parasitic diseases: Secondary | ICD-10-CM

## 2019-09-23 DIAGNOSIS — Z3A11 11 weeks gestation of pregnancy: Secondary | ICD-10-CM

## 2019-09-23 MED ORDER — BLOOD PRESSURE CUFF MISC: 1.0000 | 0 refills | Status: DC

## 2019-09-23 NOTE — Progress Notes (Signed)
Pt is here for initial OB visit, LMP 07/07/19. Pt reports h/o chronic headaches, she has been taking aspirin for her symptoms.

## 2019-09-23 NOTE — Progress Notes (Signed)
Subjective:    Amy Little is a [id number] [redacted]w[redacted]d being seen today for her first obstetrical visit.  Her obstetrical history is significant for two previous full term uncomplicated pregnancies. Patient does intend to breast feed. Pregnancy history fully reviewed.  Patient reports frequent headaches.  Vitals:   09/23/19 1351  BP: 127/82  Pulse: (!) 103  Weight: 185 lb 4.8 oz (84.1 kg)    HISTORY: OB History  Gravida Para Term Preterm AB Living  4 2 2   1 2   SAB TAB Ectopic Multiple Live Births  1     0 2    # Outcome Date GA Lbr Len/2nd Weight Sex Delivery Anes PTL Lv  4 Current           3 Term 11/30/16 [redacted]w[redacted]d 06:10 / 00:54 7 lb 5.8 oz (3.34 kg) F Vag-Spont EPI  LIV  2 Term 2012 [redacted]w[redacted]d  9 lb 12 oz (4.423 kg) M Vag-Spont EPI N LIV     Birth Comments: no diabetes  1 SAB            Past Medical History:  Diagnosis Date  . Asthma   . Bipolar disorder ([place])    "years ago, not sure if it's true"  . Depression   . Herpes genitalia    rare outbreaks  . HPV in female   . Vaginal Pap smear, abnormal    Past Surgical History:  Procedure Laterality Date  . WISDOM TOOTH EXTRACTION     Family History  Problem Relation Age of Onset  . Thyroid disease Mother   . Depression Mother   . Anxiety disorder Mother   . ADD / ADHD Mother   . COPD Father   . Cancer Maternal Grandmother   . Cancer Maternal Grandfather   . COPD Paternal Grandmother      Exam    Uterus:     Pelvic Exam:    Perineum: Normal Perineum   Vulva: normal   Vagina:  normal mucosa, normal discharge   pH:    Cervix: multiparous appearance and closed and long   Adnexa: no mass, fullness, tenderness   Bony Pelvis: gynecoid  System: Breast:  normal appearance, no masses or tenderness   Skin: normal coloration and turgor, no rashes    Neurologic: normal, no focal deficits   Extremities: normal strength, tone, and muscle mass   HEENT extra ocular movement intact   Mouth/Teeth mucous membranes moist,  pharynx normal without lesions and dental hygiene good   Neck supple and no masses   Cardiovascular: regular rate and rhythm   Respiratory:  appears well, vitals normal, no respiratory distress, acyanotic, normal RR, chest clear, no wheezing, crepitations, rhonchi, normal symmetric air entry   Abdomen: soft, non-tender; bowel sounds normal; no masses,  no organomegaly   Urinary:       Assessment:    Pregnancy: [id number] Patient Active Problem List   Diagnosis Date Noted  . Supervision of other normal pregnancy, antepartum 08/30/2019  . MDD (major depressive disorder), recurrent episode, moderate ([place]) 12/20/2017  . Asthma 10/25/2016  . History of herpes labialis 10/25/2016  . ASCUS with positive high risk HPV cervical pap smear 05/06/2016        Plan:     Initial labs drawn. Prenatal vitamins. Problem list reviewed and updated. Genetic Screening discussed : Panorama ordered .  Ultrasound discussed; fetal survey: ordered with AFP at [facility] Patient referred to neurology for HA evaluation  Follow up in 4 weeks. 50%  of 30 min visit spent on counseling and coordination of care.     Stefania Goulart 09/23/2019

## 2019-09-23 NOTE — Patient Instructions (Signed)
° °First Trimester of Pregnancy °The first trimester of pregnancy is from week 1 until the end of week 13 (months 1 through 3). A week after a sperm fertilizes an egg, the egg will implant on the wall of the uterus. This embryo will begin to develop into a baby. Genes from you and your partner will form the baby. The female genes will determine whether the baby will be a boy or a girl. At 6-8 weeks, the eyes and face will be formed, and the heartbeat can be seen on ultrasound. At the end of 12 weeks, all the baby's organs will be formed. °Now that you are pregnant, you will want to do everything you can to have a healthy baby. Two of the most important things are to get good prenatal care and to follow your health care provider's instructions. Prenatal care is all the medical care you receive before the baby's birth. This care will help prevent, find, and treat any problems during the pregnancy and childbirth. °Body changes during your first trimester °Your body goes through many changes during pregnancy. The changes vary from woman to woman. °· You may gain or lose a couple of pounds at first. °· You may feel sick to your stomach (nauseous) and you may throw up (vomit). If the vomiting is uncontrollable, call your health care provider. °· You may tire easily. °· You may develop headaches that can be relieved by medicines. All medicines should be approved by your health care provider. °· You may urinate more often. Painful urination may mean you have a bladder infection. °· You may develop heartburn as a result of your pregnancy. °· You may develop constipation because certain hormones are causing the muscles that push stool through your intestines to slow down. °· You may develop hemorrhoids or swollen veins (varicose veins). °· Your breasts may begin to grow larger and become tender. Your nipples may stick out more, and the tissue that surrounds them (areola) may become darker. °· Your gums may bleed and may be  sensitive to brushing and flossing. °· Dark spots or blotches (chloasma, mask of pregnancy) may develop on your face. This will likely fade after the baby is born. °· Your menstrual periods will stop. °· You may have a loss of appetite. °· You may develop cravings for certain kinds of food. °· You may have changes in your emotions from day to day, such as being excited to be pregnant or being concerned that something may go wrong with the pregnancy and baby. °· You may have more vivid and strange dreams. °· You may have changes in your hair. These can include thickening of your hair, rapid growth, and changes in texture. Some women also have hair loss during or after pregnancy, or hair that feels dry or thin. Your hair will most likely return to normal after your baby is born. °What to expect at prenatal visits °During a routine prenatal visit: °· You will be weighed to make sure you and the baby are growing normally. °· Your blood pressure will be taken. °· Your abdomen will be measured to track your baby's growth. °· The fetal heartbeat will be listened to between weeks 10 and 14 of your pregnancy. °· Test results from any previous visits will be discussed. °Your health care provider may ask you: °· How you are feeling. °· If you are feeling the baby move. °· If you have had any abnormal symptoms, such as leaking fluid, bleeding, severe headaches, or   abdominal cramping. °· If you are using any tobacco products, including cigarettes, chewing tobacco, and electronic cigarettes. °· If you have any questions. °Other tests that may be performed during your first trimester include: °· Blood tests to find your blood type and to check for the presence of any previous infections. The tests will also be used to check for low iron levels (anemia) and protein on red blood cells (Rh antibodies). Depending on your risk factors, or if you previously had diabetes during pregnancy, you may have tests to check for high blood sugar  that affects pregnant women (gestational diabetes). °· Urine tests to check for infections, diabetes, or protein in the urine. °· An ultrasound to confirm the proper growth and development of the baby. °· Fetal screens for spinal cord problems (spina bifida) and Down syndrome. °· HIV (human immunodeficiency virus) testing. Routine prenatal testing includes screening for HIV, unless you choose not to have this test. °· You may need other tests to make sure you and the baby are doing well. °Follow these instructions at home: °Medicines °· Follow your health care provider's instructions regarding medicine use. Specific medicines may be either safe or unsafe to take during pregnancy. °· Take a prenatal vitamin that contains at least 600 micrograms (mcg) of folic acid. °· If you develop constipation, try taking a stool softener if your health care provider approves. °Eating and drinking ° °· Eat a balanced diet that includes fresh fruits and vegetables, whole grains, good sources of protein such as meat, eggs, or tofu, and low-fat dairy. Your health care provider will help you determine the amount of weight gain that is right for you. °· Avoid raw meat and uncooked cheese. These carry germs that can cause birth defects in the baby. °· Eating four or five small meals rather than three large meals a day may help relieve nausea and vomiting. If you start to feel nauseous, eating a few soda crackers can be helpful. Drinking liquids between meals, instead of during meals, also seems to help ease nausea and vomiting. °· Limit foods that are high in fat and processed sugars, such as fried and sweet foods. °· To prevent constipation: °? Eat foods that are high in fiber, such as fresh fruits and vegetables, whole grains, and beans. °? Drink enough fluid to keep your urine clear or pale yellow. °Activity °· Exercise only as directed by your health care provider. Most women can continue their usual exercise routine during  pregnancy. Try to exercise for 30 minutes at least 5 days a week. Exercising will help you: °? Control your weight. °? Stay in shape. °? Be prepared for labor and delivery. °· Experiencing pain or cramping in the lower abdomen or lower back is a good sign that you should stop exercising. Check with your health care provider before continuing with normal exercises. °· Try to avoid standing for long periods of time. Move your legs often if you must stand in one place for a long time. °· Avoid heavy lifting. °· Wear low-heeled shoes and practice good posture. °· You may continue to have sex unless your health care provider tells you not to. °Relieving pain and discomfort °· Wear a good support bra to relieve breast tenderness. °· Take warm sitz baths to soothe any pain or discomfort caused by hemorrhoids. Use hemorrhoid cream if your health care provider approves. °· Rest with your legs elevated if you have leg cramps or low back pain. °· If you develop varicose veins   in your legs, wear support hose. Elevate your feet for 15 minutes, 3-4 times a day. Limit salt in your diet. °Prenatal care °· Schedule your prenatal visits by the twelfth week of pregnancy. They are usually scheduled monthly at first, then more often in the last 2 months before delivery. °· Write down your questions. Take them to your prenatal visits. °· Keep all your prenatal visits as told by your health care provider. This is important. °Safety °· Wear your seat belt at all times when driving. °· Make a list of emergency phone numbers, including numbers for family, friends, the hospital, and police and fire departments. °General instructions °· Ask your health care provider for a referral to a local prenatal education class. Begin classes no later than the beginning of month 6 of your pregnancy. °· Ask for help if you have counseling or nutritional needs during pregnancy. Your health care provider can offer advice or refer you to specialists for help  with various needs. °· Do not use hot tubs, steam rooms, or saunas. °· Do not douche or use tampons or scented sanitary pads. °· Do not cross your legs for long periods of time. °· Avoid cat litter boxes and soil used by cats. These carry germs that can cause birth defects in the baby and possibly loss of the fetus by miscarriage or stillbirth. °· Avoid all smoking, herbs, alcohol, and medicines not prescribed by your health care provider. Chemicals in these products affect the formation and growth of the baby. °· Do not use any products that contain nicotine or tobacco, such as cigarettes and e-cigarettes. If you need help quitting, ask your health care provider. You may receive counseling support and other resources to help you quit. °· Schedule a dentist appointment. At home, brush your teeth with a soft toothbrush and be gentle when you floss. °Contact a health care provider if: °· You have dizziness. °· You have mild pelvic cramps, pelvic pressure, or nagging pain in the abdominal area. °· You have persistent nausea, vomiting, or diarrhea. °· You have a bad smelling vaginal discharge. °· You have pain when you urinate. °· You notice increased swelling in your face, hands, legs, or ankles. °· You are exposed to fifth disease or chickenpox. °· You are exposed to German measles (rubella) and have never had it. °Get help right away if: °· You have a fever. °· You are leaking fluid from your vagina. °· You have spotting or bleeding from your vagina. °· You have severe abdominal cramping or pain. °· You have rapid weight gain or loss. °· You vomit blood or material that looks like coffee grounds. °· You develop a severe headache. °· You have shortness of breath. °· You have any kind of trauma, such as from a fall or a car accident. °Summary °· The first trimester of pregnancy is from week 1 until the end of week 13 (months 1 through 3). °· Your body goes through many changes during pregnancy. The changes vary from  woman to woman. °· You will have routine prenatal visits. During those visits, your health care provider will examine you, discuss any test results you may have, and talk with you about how you are feeling. °This information is not intended to replace advice given to you by your health care provider. Make sure you discuss any questions you have with your health care provider. °Document Released: 09/20/2001 Document Revised: 09/08/2017 Document Reviewed: 09/07/2016 °[employer] Patient Education © 2020 [employer] ° ° °  Second Trimester of Pregnancy °The second trimester is from week 14 through week 27 (months 4 through 6). The second trimester is often a time when you feel your best. Your body has adjusted to being pregnant, and you begin to feel better physically. Usually, morning sickness has lessened or quit completely, you may have more energy, and you may have an increase in appetite. The second trimester is also a time when the fetus is growing rapidly. At the end of the sixth month, the fetus is about 9 inches long and weighs about 1½ pounds. You will likely begin to feel the baby move (quickening) between 16 and 20 weeks of pregnancy. °Body changes during your second trimester °Your body continues to go through many changes during your second trimester. The changes vary from woman to woman. °· Your weight will continue to increase. You will notice your lower abdomen bulging out. °· You may begin to get stretch marks on your hips, abdomen, and breasts. °· You may develop headaches that can be relieved by medicines. The medicines should be approved by your health care provider. °· You may urinate more often because the fetus is pressing on your bladder. °· You may develop or continue to have heartburn as a result of your pregnancy. °· You may develop constipation because certain hormones are causing the muscles that push waste through your intestines to slow down. °· You may develop hemorrhoids or swollen,  bulging veins (varicose veins). °· You may have back pain. This is caused by: °? Weight gain. °? Pregnancy hormones that are relaxing the joints in your pelvis. °? A shift in weight and the muscles that support your balance. °· Your breasts will continue to grow and they will continue to become tender. °· Your gums may bleed and may be sensitive to brushing and flossing. °· Dark spots or blotches (chloasma, mask of pregnancy) may develop on your face. This will likely fade after the baby is born. °· A dark line from your belly button to the pubic area (linea nigra) may appear. This will likely fade after the baby is born. °· You may have changes in your hair. These can include thickening of your hair, rapid growth, and changes in texture. Some women also have hair loss during or after pregnancy, or hair that feels dry or thin. Your hair will most likely return to normal after your baby is born. °What to expect at prenatal visits °During a routine prenatal visit: °· You will be weighed to make sure you and the fetus are growing normally. °· Your blood pressure will be taken. °· Your abdomen will be measured to track your baby's growth. °· The fetal heartbeat will be listened to. °· Any test results from the previous visit will be discussed. °Your health care provider may ask you: °· How you are feeling. °· If you are feeling the baby move. °· If you have had any abnormal symptoms, such as leaking fluid, bleeding, severe headaches, or abdominal cramping. °· If you are using any tobacco products, including cigarettes, chewing tobacco, and electronic cigarettes. °· If you have any questions. °Other tests that may be performed during your second trimester include: °· Blood tests that check for: °? Low iron levels (anemia). °? High blood sugar that affects pregnant women (gestational diabetes) between 24 and 28 weeks. °? Rh antibodies. This is to check for a protein on red blood cells (Rh factor). °· Urine tests to check  for infections, diabetes, or protein in   the urine. °· An ultrasound to confirm the proper growth and development of the baby. °· An amniocentesis to check for possible genetic problems. °· Fetal screens for spina bifida and Down syndrome. °· HIV (human immunodeficiency virus) testing. Routine prenatal testing includes screening for HIV, unless you choose not to have this test. °Follow these instructions at home: °Medicines °· Follow your health care provider's instructions regarding medicine use. Specific medicines may be either safe or unsafe to take during pregnancy. °· Take a prenatal vitamin that contains at least 600 micrograms (mcg) of folic acid. °· If you develop constipation, try taking a stool softener if your health care provider approves. °Eating and drinking ° °· Eat a balanced diet that includes fresh fruits and vegetables, whole grains, good sources of protein such as meat, eggs, or tofu, and low-fat dairy. Your health care provider will help you determine the amount of weight gain that is right for you. °· Avoid raw meat and uncooked cheese. These carry germs that can cause birth defects in the baby. °· If you have low calcium intake from food, talk to your health care provider about whether you should take a daily calcium supplement. °· Limit foods that are high in fat and processed sugars, such as fried and sweet foods. °· To prevent constipation: °? Drink enough fluid to keep your urine clear or pale yellow. °? Eat foods that are high in fiber, such as fresh fruits and vegetables, whole grains, and beans. °Activity °· Exercise only as directed by your health care provider. Most women can continue their usual exercise routine during pregnancy. Try to exercise for 30 minutes at least 5 days a week. Stop exercising if you experience uterine contractions. °· Avoid heavy lifting, wear low heel shoes, and practice good posture. °· A sexual relationship may be continued unless your health care provider  directs you otherwise. °Relieving pain and discomfort °· Wear a good support bra to prevent discomfort from breast tenderness. °· Take warm sitz baths to soothe any pain or discomfort caused by hemorrhoids. Use hemorrhoid cream if your health care provider approves. °· Rest with your legs elevated if you have leg cramps or low back pain. °· If you develop varicose veins, wear support hose. Elevate your feet for 15 minutes, 3-4 times a day. Limit salt in your diet. °Prenatal Care °· Write down your questions. Take them to your prenatal visits. °· Keep all your prenatal visits as told by your health care provider. This is important. °Safety °· Wear your seat belt at all times when driving. °· Make a list of emergency phone numbers, including numbers for family, friends, the hospital, and police and fire departments. °General instructions °· Ask your health care provider for a referral to a local prenatal education class. Begin classes no later than the beginning of month 6 of your pregnancy. °· Ask for help if you have counseling or nutritional needs during pregnancy. Your health care provider can offer advice or refer you to specialists for help with various needs. °· Do not use hot tubs, steam rooms, or saunas. °· Do not douche or use tampons or scented sanitary pads. °· Do not cross your legs for long periods of time. °· Avoid cat litter boxes and soil used by cats. These carry germs that can cause birth defects in the baby and possibly loss of the fetus by miscarriage or stillbirth. °· Avoid all smoking, herbs, alcohol, and unprescribed drugs. Chemicals in these products can affect the formation   and growth of the baby. °· Do not use any products that contain nicotine or tobacco, such as cigarettes and e-cigarettes. If you need help quitting, ask your health care provider. °· Visit your dentist if you have not gone yet during your pregnancy. Use a soft toothbrush to brush your teeth and be gentle when you  floss. °Contact a health care provider if: °· You have dizziness. °· You have mild pelvic cramps, pelvic pressure, or nagging pain in the abdominal area. °· You have persistent nausea, vomiting, or diarrhea. °· You have a bad smelling vaginal discharge. °· You have pain when you urinate. °Get help right away if: °· You have a fever. °· You are leaking fluid from your vagina. °· You have spotting or bleeding from your vagina. °· You have severe abdominal cramping or pain. °· You have rapid weight gain or weight loss. °· You have shortness of breath with chest pain. °· You notice sudden or extreme swelling of your face, hands, ankles, feet, or legs. °· You have not felt your baby move in over an hour. °· You have severe headaches that do not go away when you take medicine. °· You have vision changes. °Summary °· The second trimester is from week 14 through week 27 (months 4 through 6). It is also a time when the fetus is growing rapidly. °· Your body goes through many changes during pregnancy. The changes vary from woman to woman. °· Avoid all smoking, herbs, alcohol, and unprescribed drugs. These chemicals affect the formation and growth your baby. °· Do not use any tobacco products, such as cigarettes, chewing tobacco, and e-cigarettes. If you need help quitting, ask your health care provider. °· Contact your health care provider if you have any questions. Keep all prenatal visits as told by your health care provider. This is important. °This information is not intended to replace advice given to you by your health care provider. Make sure you discuss any questions you have with your health care provider. °Document Released: 09/20/2001 Document Revised: 01/18/2019 Document Reviewed: 11/01/2016 °[employer] Patient Education © 2020 [employer] ° ° °Contraception Choices °Contraception, also called birth control, refers to methods or devices that prevent pregnancy. °Hormonal methods °Contraceptive implant ° °A  contraceptive implant is a thin, plastic tube that contains a hormone. It is inserted into the upper part of the arm. It can remain in place for up to 3 years. °Progestin-only injections °Progestin-only injections are injections of progestin, a synthetic form of the hormone progesterone. They are given every 3 months by a health care provider. °Birth control pills ° °Birth control pills are pills that contain hormones that prevent pregnancy. They must be taken once a day, preferably at the same time each day. °Birth control patch ° °The birth control patch contains hormones that prevent pregnancy. It is placed on the skin and must be changed once a week for three weeks and removed on the fourth week. A prescription is needed to use this method of contraception. °Vaginal ring ° °A vaginal ring contains hormones that prevent pregnancy. It is placed in the vagina for three weeks and removed on the fourth week. After that, the process is repeated with a new ring. A prescription is needed to use this method of contraception. °Emergency contraceptive °Emergency contraceptives prevent pregnancy after unprotected sex. They come in pill form and can be taken up to 5 days after sex. They work best the sooner they are taken after having sex. Most emergency contraceptives   are available without a prescription. This method should not be used as your only form of birth control. °Barrier methods °Female condom ° °A female condom is a thin sheath that is worn over the penis during sex. Condoms keep sperm from going inside a woman's body. They can be used with a spermicide to increase their effectiveness. They should be disposed after a single use. °Female condom ° °A female condom is a soft, loose-fitting sheath that is put into the vagina before sex. The condom keeps sperm from going inside a woman's body. They should be disposed after a single use. °Diaphragm ° °A diaphragm is a soft, dome-shaped barrier. It is inserted into the  vagina before sex, along with a spermicide. The diaphragm blocks sperm from entering the uterus, and the spermicide kills sperm. A diaphragm should be left in the vagina for 6-8 hours after sex and removed within 24 hours. °A diaphragm is prescribed and fitted by a health care provider. A diaphragm should be replaced every 1-2 years, after giving birth, after gaining more than 15 lb (6.8 kg), and after pelvic surgery. °Cervical cap ° °A cervical cap is a round, soft latex or plastic cup that fits over the cervix. It is inserted into the vagina before sex, along with spermicide. It blocks sperm from entering the uterus. The cap should be left in place for 6-8 hours after sex and removed within 48 hours. A cervical cap must be prescribed and fitted by a health care provider. It should be replaced every 2 years. °Sponge ° °A sponge is a soft, circular piece of polyurethane foam with spermicide on it. The sponge helps block sperm from entering the uterus, and the spermicide kills sperm. To use it, you make it wet and then insert it into the vagina. It should be inserted before sex, left in for at least 6 hours after sex, and removed and thrown away within 30 hours. °Spermicides °Spermicides are chemicals that kill or block sperm from entering the cervix and uterus. They can come as a cream, jelly, suppository, foam, or tablet. A spermicide should be inserted into the vagina with an applicator at least 10-15 minutes before sex to allow time for it to work. The process must be repeated every time you have sex. Spermicides do not require a prescription. °Intrauterine contraception °Intrauterine device (IUD) °An IUD is a T-shaped device that is put in a woman's uterus. There are two types: °· Hormone IUD.This type contains progestin, a synthetic form of the hormone progesterone. This type can stay in place for 3-5 years. °· Copper IUD.This type is wrapped in copper wire. It can stay in place for 10 years. ° °Permanent  methods of contraception °Female tubal ligation °In this method, a woman's fallopian tubes are sealed, tied, or blocked during surgery to prevent eggs from traveling to the uterus. °Hysteroscopic sterilization °In this method, a small, flexible insert is placed into each fallopian tube. The inserts cause scar tissue to form in the fallopian tubes and block them, so sperm cannot reach an egg. The procedure takes about 3 months to be effective. Another form of birth control must be used during those 3 months. °Female sterilization °This is a procedure to tie off the tubes that carry sperm (vasectomy). After the procedure, the man can still ejaculate fluid (semen). °Natural planning methods °Natural family planning °In this method, a couple does not have sex on days when the woman could become pregnant. °Calendar method °This means keeping track of   the length of each menstrual cycle, identifying the days when pregnancy can happen, and not having sex on those days. °Ovulation method °In this method, a couple avoids sex during ovulation. °Symptothermal method °This method involves not having sex during ovulation. The woman typically checks for ovulation by watching changes in her temperature and in the consistency of cervical mucus. °Post-ovulation method °In this method, a couple waits to have sex until after ovulation. °Summary °· Contraception, also called birth control, means methods or devices that prevent pregnancy. °· Hormonal methods of contraception include implants, injections, pills, patches, vaginal rings, and emergency contraceptives. °· Barrier methods of contraception can include female condoms, female condoms, diaphragms, cervical caps, sponges, and spermicides. °· There are two types of IUDs (intrauterine devices). An IUD can be put in a woman's uterus to prevent pregnancy for 3-5 years. °· Permanent sterilization can be done through a procedure for males, females, or both. °· Natural family planning methods  involve not having sex on days when the woman could become pregnant. °This information is not intended to replace advice given to you by your health care provider. Make sure you discuss any questions you have with your health care provider. °Document Released: 09/26/2005 Document Revised: 09/28/2017 Document Reviewed: 10/29/2016 °Elsevier Patient Education © 2020 Else[employer] Inc. ° ° °Breastfeeding ° °Choosing to breastfeed is one of the best decisions you can make for yourself and your baby. A change in hormones during pregnancy causes your breasts to make breast milk in your milk-producing glands. Hormones prevent breast milk from being released before your baby is born. They also prompt milk flow after birth. Once breastfeeding has begun, thoughts of your baby, as well as his or her sucking or crying, can stimulate the release of milk from your milk-producing glands. °Benefits of breastfeeding °Research shows that breastfeeding offers many health benefits for infants and mothers. It also offers a cost-free and convenient way to feed your baby. °For your baby °· Your first milk (colostrum) helps your baby's digestive system to function better. °· Special cells in your milk (antibodies) help your baby to fight off infections. °· Breastfed babies are less likely to develop asthma, allergies, obesity, or type 2 diabetes. They are also at lower risk for sudden infant death syndrome (SIDS). °· Nutrients in breast milk are better able to meet your baby’s needs compared to infant formula. °· Breast milk improves your baby's brain development. °For you °· Breastfeeding helps to create a very special bond between you and your baby. °· Breastfeeding is convenient. Breast milk costs nothing and is always available at the correct temperature. °· Breastfeeding helps to burn calories. It helps you to lose the weight that you gained during pregnancy. °· Breastfeeding makes your uterus return faster to its size before pregnancy. It  also slows bleeding (lochia) after you give birth. °· Breastfeeding helps to lower your risk of developing type 2 diabetes, osteoporosis, rheumatoid arthritis, cardiovascular disease, and breast, ovarian, uterine, and endometrial cancer later in life. °Breastfeeding basics °Starting breastfeeding °· Find a comfortable place to sit or lie down, with your neck and back well-supported. °· Place a pillow or a rolled-up blanket under your baby to bring him or her to the level of your breast (if you are seated). Nursing pillows are specially designed to help support your arms and your baby while you breastfeed. °· Make sure that your baby's tummy (abdomen) is facing your abdomen. °· Gently massage your breast. With your fingertips, massage from the outer edges   of your breast inward toward the nipple. This encourages milk flow. If your milk flows slowly, you may need to continue this action during the feeding. °· Support your breast with 4 fingers underneath and your thumb above your nipple (make the letter "C" with your hand). Make sure your fingers are well away from your nipple and your baby’s mouth. °· Stroke your baby's lips gently with your finger or nipple. °· When your baby's mouth is open wide enough, quickly bring your baby to your breast, placing your entire nipple and as much of the areola as possible into your baby's mouth. The areola is the colored area around your nipple. °? More areola should be visible above your baby's upper lip than below the lower lip. °? Your baby's lips should be opened and extended outward (flanged) to ensure an adequate, comfortable latch. °? Your baby's tongue should be between his or her lower gum and your breast. °· Make sure that your baby's mouth is correctly positioned around your nipple (latched). Your baby's lips should create a seal on your breast and be turned out (everted). °· It is common for your baby to suck about 2-3 minutes in order to start the flow of breast  milk. °Latching °Teaching your baby how to latch onto your breast properly is very important. An improper latch can cause nipple pain, decreased milk supply, and poor weight gain in your baby. Also, if your baby is not latched onto your nipple properly, he or she may swallow some air during feeding. This can make your baby fussy. Burping your baby when you switch breasts during the feeding can help to get rid of the air. However, teaching your baby to latch on properly is still the best way to prevent fussiness from swallowing air while breastfeeding. °Signs that your baby has successfully latched onto your nipple °· Silent tugging or silent sucking, without causing you pain. Infant's lips should be extended outward (flanged). °· Swallowing heard between every 3-4 sucks once your milk has started to flow (after your let-down milk reflex occurs). °· Muscle movement above and in front of his or her ears while sucking. °Signs that your baby has not successfully latched onto your nipple °· Sucking sounds or smacking sounds from your baby while breastfeeding. °· Nipple pain. °If you think your baby has not latched on correctly, slip your finger into the corner of your baby’s mouth to break the suction and place it between your baby's gums. Attempt to start breastfeeding again. °Signs of successful breastfeeding °Signs from your baby °· Your baby will gradually decrease the number of sucks or will completely stop sucking. °· Your baby will fall asleep. °· Your baby's body will relax. °· Your baby will retain a small amount of milk in his or her mouth. °· Your baby will let go of your breast by himself or herself. °Signs from you °· Breasts that have increased in firmness, weight, and size 1-3 hours after feeding. °· Breasts that are softer immediately after breastfeeding. °· Increased milk volume, as well as a change in milk consistency and color by the fifth day of breastfeeding. °· Nipples that are not sore, cracked, or  bleeding. °Signs that your baby is getting enough milk °· Wetting at least 1-2 diapers during the first 24 hours after birth. °· Wetting at least 5-6 diapers every 24 hours for the first week after birth. The urine should be clear or pale yellow by the age of 5 days. °· Wetting   6-8 diapers every 24 hours as your baby continues to grow and develop. °· At least 3 stools in a 24-hour period by the age of 5 days. The stool should be soft and yellow. °· At least 3 stools in a 24-hour period by the age of 7 days. The stool should be seedy and yellow. °· No loss of weight greater than 10% of birth weight during the first 3 days of life. °· Average weight gain of 4-7 oz (113-198 g) per week after the age of 4 days. °· Consistent daily weight gain by the age of 5 days, without weight loss after the age of 2 weeks. °After a feeding, your baby may spit up a small amount of milk. This is normal. °Breastfeeding frequency and duration °Frequent feeding will help you make more milk and can prevent sore nipples and extremely full breasts (breast engorgement). Breastfeed when you feel the need to reduce the fullness of your breasts or when your baby shows signs of hunger. This is called "breastfeeding on demand." Signs that your baby is hungry include: °· Increased alertness, activity, or restlessness. °· Movement of the head from side to side. °· Opening of the mouth when the corner of the mouth or cheek is stroked (rooting). °· Increased sucking sounds, smacking lips, cooing, sighing, or squeaking. °· Hand-to-mouth movements and sucking on fingers or hands. °· Fussing or crying. °Avoid introducing a pacifier to your baby in the first 4-6 weeks after your baby is born. After this time, you may choose to use a pacifier. Research has shown that pacifier use during the first year of a baby's life decreases the risk of sudden infant death syndrome (SIDS). °Allow your baby to feed on each breast as long as he or she wants. When your  baby unlatches or falls asleep while feeding from the first breast, offer the second breast. Because newborns are often sleepy in the first few weeks of life, you may need to awaken your baby to get him or her to feed. °Breastfeeding times will vary from baby to baby. However, the following rules can serve as a guide to help you make sure that your baby is properly fed: °· Newborns (babies 4 weeks of age or younger) may breastfeed every 1-3 hours. °· Newborns should not go without breastfeeding for longer than 3 hours during the day or 5 hours during the night. °· You should breastfeed your baby a minimum of 8 times in a 24-hour period. °Breast milk pumping ° °  ° °Pumping and storing breast milk allows you to make sure that your baby is exclusively fed your breast milk, even at times when you are unable to breastfeed. This is especially important if you go back to work while you are still breastfeeding, or if you are not able to be present during feedings. Your lactation consultant can help you find a method of pumping that works best for you and give you guidelines about how long it is safe to store breast milk. °Caring for your breasts while you breastfeed °Nipples can become dry, cracked, and sore while breastfeeding. The following recommendations can help keep your breasts moisturized and healthy: °· Avoid using soap on your nipples. °· Wear a supportive bra designed especially for nursing. Avoid wearing underwire-style bras or extremely tight bras (sports bras). °· Air-dry your nipples for 3-4 minutes after each feeding. °· Use only cotton bra pads to absorb leaked breast milk. Leaking of breast milk between feedings is normal. °·   Use lanolin on your nipples after breastfeeding. Lanolin helps to maintain your skin's normal moisture barrier. Pure lanolin is not harmful (not toxic) to your baby. You may also hand express a few drops of breast milk and gently massage that milk into your nipples and allow the milk  to air-dry. °In the first few weeks after giving birth, some women experience breast engorgement. Engorgement can make your breasts feel heavy, warm, and tender to the touch. Engorgement peaks within 3-5 days after you give birth. The following recommendations can help to ease engorgement: °· Completely empty your breasts while breastfeeding or pumping. You may want to start by applying warm, moist heat (in the shower or with warm, water-soaked hand towels) just before feeding or pumping. This increases circulation and helps the milk flow. If your baby does not completely empty your breasts while breastfeeding, pump any extra milk after he or she is finished. °· Apply ice packs to your breasts immediately after breastfeeding or pumping, unless this is too uncomfortable for you. To do this: °? Put ice in a plastic bag. °? Place a towel between your skin and the bag. °? Leave the ice on for 20 minutes, 2-3 times a day. °· Make sure that your baby is latched on and positioned properly while breastfeeding. °If engorgement persists after 48 hours of following these recommendations, contact your health care provider or a lactation consultant. °Overall health care recommendations while breastfeeding °· Eat 3 healthy meals and 3 snacks every day. Well-nourished mothers who are breastfeeding need an additional 450-500 calories a day. You can meet this requirement by increasing the amount of a balanced diet that you eat. °· Drink enough water to keep your urine pale yellow or clear. °· Rest often, relax, and continue to take your prenatal vitamins to prevent fatigue, stress, and low vitamin and mineral levels in your body (nutrient deficiencies). °· Do not use any products that contain nicotine or tobacco, such as cigarettes and e-cigarettes. Your baby may be harmed by chemicals from cigarettes that pass into breast milk and exposure to secondhand smoke. If you need help quitting, ask your health care provider. °· Avoid  alcohol. °· Do not use illegal drugs or marijuana. °· Talk with your health care provider before taking any medicines. These include over-the-counter and prescription medicines as well as vitamins and herbal supplements. Some medicines that may be harmful to your baby can pass through breast milk. °· It is possible to become pregnant while breastfeeding. If birth control is desired, ask your health care provider about options that will be safe while breastfeeding your baby. °Where to find more information: °[employer]: www.llli.org °Contact a health care provider if: °· You feel like you want to stop breastfeeding or have become frustrated with breastfeeding. °· Your nipples are cracked or bleeding. °· Your breasts are red, tender, or warm. °· You have: °? Painful breasts or nipples. °? A swollen area on either breast. °? A fever or chills. °? Nausea or vomiting. °? Drainage other than breast milk from your nipples. °· Your breasts do not become full before feedings by the fifth day after you give birth. °· You feel sad and depressed. °· Your baby is: °? Too sleepy to eat well. °? Having trouble sleeping. °? More than 1 week old and wetting fewer than 6 diapers in a 24-hour period. °? Not gaining weight by 5 days of age. °· Your baby has fewer than 3 stools in a 24-hour period. °·   Your baby's skin or the white parts of his or her eyes become yellow. °Get help right away if: °· Your baby is overly tired (lethargic) and does not want to wake up and feed. °· Your baby develops an unexplained fever. °Summary °· Breastfeeding offers many health benefits for infant and mothers. °· Try to breastfeed your infant when he or she shows early signs of hunger. °· Gently tickle or stroke your baby's lips with your finger or nipple to allow the baby to open his or her mouth. Bring the baby to your breast. Make sure that much of the areola is in your baby's mouth. Offer one side and burp the baby before you offer  the other side. °· Talk with your health care provider or lactation consultant if you have questions or you face problems as you breastfeed. °This information is not intended to replace advice given to you by your health care provider. Make sure you discuss any questions you have with your health care provider. °Document Released: 09/26/2005 Document Revised: 12/21/2017 Document Reviewed: 10/28/2016 °Elsevier Patient Education © 2020 Elsevier Inc. ° °

## 2019-09-24 LAB — OBSTETRIC PANEL, INCLUDING HIV
Antibody Screen: NEGATIVE
Basophils Absolute: 0 10*3/uL (ref 0.0–0.2)
Basos: 0 %
EOS (ABSOLUTE): 0.4 10*3/uL (ref 0.0–0.4)
Eos: 5 %
HIV Screen 4th Generation wRfx: NONREACTIVE
Hematocrit: 36.2 % (ref 34.0–46.6)
Hemoglobin: 12.4 g/dL (ref 11.1–15.9)
Hepatitis B Surface Ag: NEGATIVE
Immature Grans (Abs): 0.1 10*3/uL (ref 0.0–0.1)
Immature Granulocytes: 1 %
Lymphocytes Absolute: 2.1 10*3/uL (ref 0.7–3.1)
Lymphs: 23 %
MCH: 31.5 pg (ref 26.6–33.0)
MCHC: 34.3 g/dL (ref 31.5–35.7)
MCV: 92 fL (ref 79–97)
Monocytes Absolute: 0.6 10*3/uL (ref 0.1–0.9)
Monocytes: 6 %
Neutrophils Absolute: 5.9 10*3/uL (ref 1.4–7.0)
Neutrophils: 65 %
Platelets: 289 10*3/uL (ref 150–450)
RBC: 3.94 x10E6/uL (ref 3.77–5.28)
RDW: 12.8 % (ref 11.7–15.4)
RPR Ser Ql: NONREACTIVE
Rh Factor: POSITIVE
Rubella Antibodies, IGG: 2.94 index (ref >0.99)
WBC: 9 10*3/uL (ref 3.4–10.8)

## 2019-09-24 LAB — CYTOLOGY - PAP: Diagnosis: NEGATIVE

## 2019-09-25 LAB — CULTURE, OB URINE

## 2019-09-25 LAB — URINE CULTURE, OB REFLEX: Organism ID, Bacteria: NO GROWTH

## 2019-09-30 ENCOUNTER — Encounter: Payer: Self-pay | Admitting: Obstetrics and Gynecology

## 2019-10-11 NOTE — L&D Delivery Note (Signed)
OB/GYN Faculty Practice Delivery Note  Amy Little is a 29 y.o. [id number] s/p NSVD at [redacted]w[redacted]d. She was admitted for early labor, followed by [facility].   ROM: 6h 54m with clear fluid GBS Status:  Positive/-- (06/08 4383)   Labor Progress: . Initial SVE: 4cm/70/-3. She then progressed to complete.   Delivery Date/Time: 04/09/2020 13:16 Delivery: Called to room and patient had delivered infant in the bed, spontaneous cry and viable. Per RN, no nuchal cord present. Per RN who was present, shoulder and body delivered in usual fashion. Infant with spontaneous cry, placed on mother's abdomen, dried and stimulated. I entered the room, cord clamped x 2 after 1-minute delay, and cut by FOB. Cord blood drawn. Placenta delivered spontaneously with gentle cord traction. Fundus firm with massage and Pitocin. Labia, perineum, vagina, and cervix inspected inspected with 2nd degree perineal laceration.  Baby Weight:  4306g (9lb 7.9ounces)  Placenta: Sent to L&D Complications: None Lacerations: 2nd degree perineal laceration repaired with 3-0 vicryl rapide EBL: 200 mL Analgesia: Epidural   Infant:  APGAR (1 MIN): 9   APGAR (5 MINS): 9   APGAR (10 MINS):     [name], DO OB Family Medicine, [facility] for [employer], [facility] 04/09/2020, 2:21 PM

## 2019-10-21 ENCOUNTER — Encounter: Payer: Self-pay | Admitting: Obstetrics

## 2019-10-21 ENCOUNTER — Telehealth (INDEPENDENT_AMBULATORY_CARE_PROVIDER_SITE_OTHER): Payer: Medicaid Other | Admitting: Obstetrics

## 2019-10-21 VITALS — BP 125/83

## 2019-10-21 DIAGNOSIS — R519 Headache, unspecified: Secondary | ICD-10-CM

## 2019-10-21 DIAGNOSIS — Z3A15 15 weeks gestation of pregnancy: Secondary | ICD-10-CM

## 2019-10-21 DIAGNOSIS — O26892 Other specified pregnancy related conditions, second trimester: Secondary | ICD-10-CM

## 2019-10-21 DIAGNOSIS — Z8619 Personal history of other infectious and parasitic diseases: Secondary | ICD-10-CM

## 2019-10-21 DIAGNOSIS — Z3480 Encounter for supervision of other normal pregnancy, unspecified trimester: Secondary | ICD-10-CM

## 2019-10-21 NOTE — Progress Notes (Signed)
   TELEHEALTH OBSTETRICS PRENATAL VIRTUAL VIDEO VISIT ENCOUNTER NOTE  Provider location: [facility] for [facility] [place]   I connected with Amy Little on 10/21/19 at  2:00 PM EST by OB MyChart Video Encounter at home and verified that I am speaking with the correct person using two identifiers.   I discussed the limitations, risks, security and privacy concerns of performing an evaluation and management service virtually and the availability of in person appointments. I also discussed with the patient that there may be a patient responsible charge related to this service. The patient expressed understanding and agreed to proceed. Subjective:  Amy Little is a 29 y.o. [id number] at [redacted]w[redacted]d being seen today for ongoing prenatal care.  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  Patient reports headache.  Contractions: Not present. Vag. Bleeding: None.   . Denies any leaking of fluid.   The following portions of the patient's history were reviewed and updated as appropriate: allergies, current medications, past family history, past medical history, past social history, past surgical history and problem list.   Objective:   Vitals:   10/21/19 1344  BP: 125/83    Fetal Status:           General:  Alert, oriented and cooperative. Patient is in no acute distress.  Respiratory: Normal respiratory effort, no problems with respiration noted  Mental Status: Normal mood and affect. Normal behavior. Normal judgment and thought content.  Rest of physical exam deferred due to type of encounter  Imaging: No results found.  Assessment and Plan:  Pregnancy: [id number] at [redacted]w[redacted]d  1. Supervision of other normal pregnancy,  antepartum  2. History of herpes labialis - suppression starting in 3rd trimester  3. Frequent headaches - patient taking Goody's Powder for HA's.  She was advised not to take any NSAIDS during pregnancy, and that she can take Tylenol only for aches and pains and HA  Preterm labor symptoms and general obstetric precautions including but not limited to vaginal bleeding, contractions, leaking of fluid and fetal movement were reviewed in detail with the patient. I discussed the assessment and treatment plan with the patient. The patient was provided an opportunity to ask questions and all were answered. The patient agreed with the plan and demonstrated an understanding of the instructions. The patient was advised to call back or seek an in-person office evaluation/go to [facility] at [facility] for any urgent or concerning symptoms. Please refer to After Visit Summary for other counseling recommendations.   I provided 10 minutes of face-to-face time during this encounter.  Return in about 4 weeks (around 11/18/2019) for MyChart.  Future Appointments  Date Time Provider Department Center  11/11/2019  1:00 PM WH-MFC [place] 3 WH-MFCUS MFC-US  11/11/2019  2:10 PM WOC-WOCA LAB WOC-WOCA WOC    Coral Ceo, MD Center for [employer], [facility] 10/21/2019

## 2019-11-11 ENCOUNTER — Other Ambulatory Visit (HOSPITAL_COMMUNITY): Payer: Self-pay | Admitting: *Deleted

## 2019-11-11 ENCOUNTER — Ambulatory Visit (HOSPITAL_COMMUNITY)
Admission: RE | Admit: 2019-11-11 | Discharge: 2019-11-11 | Disposition: A | Discharge status: Home / Self Care | Payer: Medicaid Other | Source: Ambulatory Visit | Attending: Obstetrics and Gynecology | Admitting: Obstetrics and Gynecology

## 2019-11-11 ENCOUNTER — Other Ambulatory Visit: Payer: Self-pay

## 2019-11-11 ENCOUNTER — Other Ambulatory Visit: Payer: Medicaid Other

## 2019-11-11 DIAGNOSIS — Z3480 Encounter for supervision of other normal pregnancy, unspecified trimester: Secondary | ICD-10-CM

## 2019-11-11 DIAGNOSIS — O4402 Placenta previa specified as without hemorrhage, second trimester: Secondary | ICD-10-CM

## 2019-11-11 DIAGNOSIS — Z3A18 18 weeks gestation of pregnancy: Secondary | ICD-10-CM | POA: Diagnosis not present

## 2019-11-12 ENCOUNTER — Other Ambulatory Visit: Payer: Self-pay | Admitting: Obstetrics and Gynecology

## 2019-11-12 DIAGNOSIS — Z3480 Encounter for supervision of other normal pregnancy, unspecified trimester: Secondary | ICD-10-CM

## 2019-11-13 LAB — AFP, SERUM, OPEN SPINA BIFIDA
AFP MoM: 1.15
AFP Value: 43 ng/mL
Gest. Age on Collection Date: 18.1 weeks
Maternal Age At EDD: 29.3 yr
OSBR Risk 1 IN: 7603
Test Results:: NEGATIVE
Weight: 196 [lb_av]

## 2019-11-18 ENCOUNTER — Telehealth: Payer: Medicaid Other | Admitting: Obstetrics

## 2019-11-18 NOTE — Progress Notes (Signed)
Tried to call patient by telephone.  No answer.

## 2019-11-21 ENCOUNTER — Telehealth (INDEPENDENT_AMBULATORY_CARE_PROVIDER_SITE_OTHER): Payer: Medicaid Other | Admitting: Obstetrics

## 2019-11-21 ENCOUNTER — Encounter: Payer: Self-pay | Admitting: Obstetrics

## 2019-11-21 VITALS — BP 130/81

## 2019-11-21 DIAGNOSIS — O0992 Supervision of high risk pregnancy, unspecified, second trimester: Secondary | ICD-10-CM

## 2019-11-21 DIAGNOSIS — O4420 Partial placenta previa NOS or without hemorrhage, unspecified trimester: Secondary | ICD-10-CM

## 2019-11-21 DIAGNOSIS — O4422 Partial placenta previa NOS or without hemorrhage, second trimester: Secondary | ICD-10-CM

## 2019-11-21 DIAGNOSIS — Z3A19 19 weeks gestation of pregnancy: Secondary | ICD-10-CM

## 2019-11-21 DIAGNOSIS — O0990 Supervision of high risk pregnancy, unspecified, unspecified trimester: Secondary | ICD-10-CM

## 2019-11-21 NOTE — Progress Notes (Signed)
Pt is on the phone preparing for virtual visit with provider. [redacted]w[redacted]d. Pt reports mild HA that goes away with Tylenol. Pt reports achy lower abd pain on the R side.

## 2019-11-21 NOTE — Progress Notes (Addendum)
TELEHEALTH OBSTETRICS PRENATAL VIRTUAL VIDEO VISIT ENCOUNTER NOTE  Provider location: Center for [facility] [place]   I connected with Amy Little on 11/21/19 at  2:30 PM EST by OB MyChart Video Encounter at home and verified that I am speaking with the correct person using two identifiers.   I discussed the limitations, risks, security and privacy concerns of performing an evaluation and management service virtually and the availability of in person appointments. I also discussed with the patient that there may be a patient responsible charge related to this service. The patient expressed understanding and agreed to proceed. Subjective:  Amy Little is a 29 y.o. T5H7416 at [redacted]w[redacted]d being seen today for ongoing prenatal care.  She is currently monitored for the following issues for this high-risk pregnancy and has ASCUS with positive high risk HPV cervical pap smear; Asthma; History of herpes labialis; MDD (major depressive disorder), recurrent episode, moderate (HCC); and Supervision of other normal pregnancy, antepartum on their problem list.  Patient reports backache.  Contractions: Not present. Vag. Bleeding: None.  Movement: Present. Denies any leaking of fluid.   The following portions of the patient's history were reviewed and updated as appropriate: allergies, current medications, past family history, past medical history, past social history, past surgical history and problem list.   Objective:   Vitals:   11/21/19 1415  BP: 130/81    Fetal Status:     Movement: Present     General:  Alert, oriented and cooperative. Patient is in no acute distress.  Respiratory: Normal respiratory effort, no problems with respiration noted  Mental Status: Normal mood and affect. Normal behavior. Normal judgment and thought content.  Rest of physical exam deferred due to type of encounter  Imaging: [place] MFM OB DETAIL +14 WK  Result Date:  11/12/2019 ----------------------------------------------------------------------  OBSTETRICS REPORT                    (Corrected Final 11/12/2019 11:02 am) ---------------------------------------------------------------------- Patient Info  ID #:       [id number]                          D.O.B.:  1991-09-11 (28 yrs)  Name:       Amy Little                   Visit Date: 11/11/2019 12:47 pm ---------------------------------------------------------------------- Performed By  Performed By:     Percell Boston          Ref. Address:      [address]                                                              [phone]  [place]                                                              [address]  Attending:        Noralee Space MD        [facility]  Referred By:      [facility] ---------------------------------------------------------------------- Orders   #  Description                          Code         Ordered By   1  [place] MFM OB DETAIL +14 WK              76811.01     PEGGY CONSTANT  ----------------------------------------------------------------------   #  Order #                    Accession #                 Episode #   1  [id number]                  [id number]                  [id number]  ---------------------------------------------------------------------- Indications   [redacted] weeks gestation of pregnancy                Z3A.18   Encounter for antenatal screening for          Z36.3   malformations (low risk NIPS, 8.2FF)   Placenta previa specified as without           O44.02   hemorrhage, second trimester  ---------------------------------------------------------------------- Fetal Evaluation  Num Of Fetuses:          1  Fetal Heart Rate(bpm):   145  Cardiac  Activity:        Observed  Presentation:            Breech  Placenta:                Posterior Previa  P. Cord Insertion:       Visualized, central  Amniotic Fluid  AFI FV:      Within normal limits                              Largest Pocket(cm)                              3.67 ---------------------------------------------------------------------- Biometry  BPD:      39.1  mm     G. Age:  17w 6d         39  %    CI:  70.07   %    70 - 86                                                          FL/HC:       18.3  %    15.8 - 18  HC:       149   mm     G. Age:  18w 0d         34  %    HC/AC:       1.11       1.07 - 1.29  AC:      134.5  mm     G. Age:  18w 6d         73  %    FL/BPD:      69.6  %  FL:       27.2  mm     G. Age:  18w 2d         50  %    FL/AC:       20.2  %    20 - 24  HUM:      25.5  mm     G. Age:  18w 0d         51  %  CER:        18  mm     G. Age:  18w 0d         44  %  NFT:       3.3  mm  CM:        4.8  mm  Est. FW:     245   gm     0 lb 9 oz     70  % ---------------------------------------------------------------------- OB History  Gravidity:    4         Term:   2        Prem:   0        SAB:   1  TOP:          0       Ectopic:  0        Living: 2 ---------------------------------------------------------------------- Gestational Age  LMP:           18w 1d        Date:  07/07/19                 EDD:   04/12/20  U/S Today:     18w 2d                                        EDD:   04/11/20  Best:          18w 1d     Det. By:  LMP  (07/07/19)          EDD:   04/12/20 ---------------------------------------------------------------------- Anatomy  Cranium:               Appears normal         LVOT:                   Not well visualized  Cavum:  Appears normal         Aortic Arch:            Appears normal  Ventricles:            Appears normal         Ductal Arch:            Not well visualized  Choroid Plexus:        Appears normal         Diaphragm:              Appears  normal  Cerebellum:            Appears normal         Stomach:                Appears normal, left                                                                        sided  Posterior Fossa:       Appears normal         Abdomen:                Appears normal  Nuchal Fold:           Appears normal         Abdominal Wall:         Appears nml (cord                                                                        insert, abd wall)  Face:                  Profile nl; orbits not Cord Vessels:           Appears normal (3                         well visualized                                vessel cord)  Lips:                  Appears normal         Kidneys:                Appear normal  Palate:                Not well visualized    Bladder:                Appears normal  Thoracic:              Appears normal         Spine:                  Appears normal  Heart:  Appears normal         Upper Extremities:      Appears normal                         (4CH, axis, and                         situs)  RVOT:                  Not well visualized    Lower Extremities:      Appears normal  Other:  Feet visualized. Technically difficult due to fetal position. ---------------------------------------------------------------------- Cervix Uterus Adnexa  Cervix  Length:              4  cm.  Normal appearance by transabdominal scan.  Uterus  No abnormality visualized.  Left Ovary  No adnexal mass visualized.  Right Ovary  No adnexal mass visualized.  Cul De Sac  No free fluid seen.  Adnexa  No abnormality visualized. ---------------------------------------------------------------------- Impression  We performed fetal anatomy scan. No makers of  aneuploidies or fetal structural defects are seen. Fetal  biometry is consistent with her previously-established dates.  Amniotic fluid is normal and good fetal activity is seen.  Patient understands the limitations of ultrasound in detecting  fetal anomalies.  Placental  edge reaches the internal os (previa). Patient does  not give history of vaginal bleeding. I reassured the patient  that in Little cases, placenta previa resolves with advancing  gestation.  On cell-free fetal DNA screening, the risks of fetal  aneuploidies are not increased. ---------------------------------------------------------------------- Recommendations  -An appointment was made for her to return in 4 weeks for  completion of fetal anatomy and to evaluate the placental  position. Transvaginal ultrasound to be performed if low-lying  placenta is suspected on transabdominal scan. ----------------------------------------------------------------------                       Tama High, MD Electronically Signed Corrected Final Report  11/12/2019 11:02 am ----------------------------------------------------------------------   Assessment and Plan:  Pregnancy: [id number] at [redacted]w[redacted]d  1. Supervision of high risk pregnancy, antepartum  2. Partial placenta previa - follow up ultrasound scheduled - patient understands previa precautions, and that there is a > 90% chance that this will resolve by 28 wks  Preterm labor symptoms and general obstetric precautions including but not limited to vaginal bleeding, contractions, leaking of fluid and fetal movement were reviewed in detail with the patient. I discussed the assessment and treatment plan with the patient. The patient was provided an opportunity to ask questions and all were answered. The patient agreed with the plan and demonstrated an understanding of the instructions. The patient was advised to call back or seek an in-person office evaluation/go to MAU at [facility] for any urgent or concerning symptoms. Please refer to After Visit Summary for other counseling recommendations.   I provided 15 minutes of face-to-face time during this encounter.  Return in about 4 weeks (around 12/19/2019) for MyChart.  Future Appointments  Date Time  Provider [place]  12/09/2019  1:45 PM [place] MFC-US  12/09/2019  1:45 PM WH-MFC [place] 2 WH-MFCUS MFC-US    Chike Farrington, [place] for [facility], [facility] 11-21-2019

## 2019-12-09 ENCOUNTER — Ambulatory Visit (HOSPITAL_COMMUNITY): Payer: Medicaid Other | Admitting: *Deleted

## 2019-12-09 ENCOUNTER — Encounter (HOSPITAL_COMMUNITY): Payer: Self-pay | Admitting: *Deleted

## 2019-12-09 ENCOUNTER — Ambulatory Visit (HOSPITAL_COMMUNITY)
Admission: RE | Admit: 2019-12-09 | Discharge: 2019-12-09 | Disposition: A | Discharge status: Home / Self Care | Payer: Medicaid Other | Source: Ambulatory Visit | Attending: Obstetrics and Gynecology | Admitting: Obstetrics and Gynecology

## 2019-12-09 ENCOUNTER — Other Ambulatory Visit: Payer: Self-pay

## 2019-12-09 ENCOUNTER — Other Ambulatory Visit (HOSPITAL_COMMUNITY): Payer: Self-pay | Admitting: *Deleted

## 2019-12-09 DIAGNOSIS — O4400 Placenta previa specified as without hemorrhage, unspecified trimester: Secondary | ICD-10-CM

## 2019-12-09 DIAGNOSIS — Z3480 Encounter for supervision of other normal pregnancy, unspecified trimester: Secondary | ICD-10-CM | POA: Diagnosis present

## 2019-12-09 DIAGNOSIS — Z362 Encounter for other antenatal screening follow-up: Secondary | ICD-10-CM

## 2019-12-09 DIAGNOSIS — Z3A22 22 weeks gestation of pregnancy: Secondary | ICD-10-CM

## 2019-12-09 DIAGNOSIS — O4402 Placenta previa specified as without hemorrhage, second trimester: Secondary | ICD-10-CM | POA: Diagnosis not present

## 2019-12-19 ENCOUNTER — Telehealth: Payer: Medicaid Other | Admitting: Obstetrics and Gynecology

## 2019-12-19 ENCOUNTER — Telehealth: Payer: Self-pay

## 2019-12-19 NOTE — Telephone Encounter (Signed)
Attempted to reach pt for virtual ROB visit today LVM x 2

## 2019-12-19 NOTE — Progress Notes (Signed)
Unable to reach patient for ROB. Voicemail left x2. Patient will be rescheduled

## 2019-12-24 ENCOUNTER — Telehealth (INDEPENDENT_AMBULATORY_CARE_PROVIDER_SITE_OTHER): Payer: Medicaid Other | Admitting: Obstetrics and Gynecology

## 2019-12-24 ENCOUNTER — Encounter: Payer: Self-pay | Admitting: Obstetrics and Gynecology

## 2019-12-24 VITALS — BP 135/83 | HR 81

## 2019-12-24 DIAGNOSIS — O4402 Placenta previa specified as without hemorrhage, second trimester: Secondary | ICD-10-CM

## 2019-12-24 DIAGNOSIS — O4400 Placenta previa specified as without hemorrhage, unspecified trimester: Secondary | ICD-10-CM | POA: Insufficient documentation

## 2019-12-24 DIAGNOSIS — Z8619 Personal history of other infectious and parasitic diseases: Secondary | ICD-10-CM

## 2019-12-24 DIAGNOSIS — Z3A24 24 weeks gestation of pregnancy: Secondary | ICD-10-CM

## 2019-12-24 DIAGNOSIS — Z3480 Encounter for supervision of other normal pregnancy, unspecified trimester: Secondary | ICD-10-CM

## 2019-12-24 NOTE — Progress Notes (Signed)
TELEHEALTH OBSTETRICS PRENATAL VIRTUAL VIDEO VISIT ENCOUNTER NOTE  Provider location: [facility] for [facility] [place]   I connected with Amy Little on 12/24/19 at  2:45 PM EDT by MyChart Video Encounter at home and verified that I am speaking with the correct person using two identifiers.   I discussed the limitations, risks, security and privacy concerns of performing an evaluation and management service virtually and the availability of in person appointments. I also discussed with the patient that there may be a patient responsible charge related to this service. The patient expressed understanding and agreed to proceed. Subjective:  Amy Little is a 29 y.o. S9H7342 at [redacted]w[redacted]d being seen today for ongoing prenatal care.  She is currently monitored for the following issues for this high-risk pregnancy and has ASCUS with positive high risk HPV cervical pap smear; Asthma; History of herpes labialis; MDD (major depressive disorder), recurrent episode, moderate (HCC); Supervision of other normal pregnancy, antepartum; and Placenta previa on their problem list.  Patient reports no complaints.  Contractions: Not present. Vag. Bleeding: None.  Movement: Present. Denies any leaking of fluid.   The following portions of the patient's history were reviewed and updated as appropriate: allergies, current medications, past family history, past medical history, past social history, past surgical history and problem list.   Objective:   Vitals:   12/24/19 1343  BP: 135/83  Pulse: 81    Fetal Status:     Movement: Present     General:  Alert, oriented and cooperative. Patient is in no acute distress.  Respiratory: Normal respiratory effort, no problems with respiration noted  Mental Status: Normal mood and affect. Normal behavior. Normal judgment and thought content.  Rest of physical exam deferred due to type of encounter  Imaging: [place] MFM OB FOLLOW UP  Result Date:  12/09/2019 ----------------------------------------------------------------------  OBSTETRICS REPORT                       (Signed Final 12/09/2019 05:26 pm) ---------------------------------------------------------------------- Patient Info  ID #:       [id number]                          D.O.B.:  16-May-1991 (29 yrs)  Name:       Amy Little                   Visit Date: 12/09/2019 03:05 pm ---------------------------------------------------------------------- Performed By  Performed By:     Sandi Mealy        Ref. Address:     [address]                                                             [phone]  [place]                                                             [address]  Attending:        Ma Rings MD         [facility]  Referred By:      [facility] ---------------------------------------------------------------------- Orders   #  Description                          Code         Ordered By   1  [place] MFM OB FOLLOW UP                  46962.95     Noralee Space  ----------------------------------------------------------------------   #  Order #                    Accession #                 Episode #   1  [id number]                  [id number]                  [id number]  ---------------------------------------------------------------------- Indications   Placenta previa specified as without           O44.02   hemorrhage, second trimester   Encounter for antenatal screening for          Z36.3   malformations (low risk NIPS, 8.2FF)   [redacted] weeks gestation of pregnancy                Z3A.22  ---------------------------------------------------------------------- Fetal Evaluation  Num Of Fetuses:         1  Fetal Heart Rate(bpm):  155  Cardiac Activity:        Observed  Presentation:           Variable  Placenta:               Posterior Previa  P. Cord Insertion:      Previously Visualized  Amniotic Fluid  AFI FV:      Within normal limits                              Largest Pocket(cm)                              7.21 ---------------------------------------------------------------------- Biometry  BPD:      50.6  mm     G. Age:  21w 2d         18  %    CI:        66.45   %  70 - 86                                                          FL/HC:      18.4   %    18.4 - 20.2  HC:       199   mm     G. Age:  22w 0d         35  %    HC/AC:      1.11        1.06 - 1.25  AC:      179.2  mm     G. Age:  22w 6d         64  %    FL/BPD:     72.5   %    71 - 87  FL:       36.7  mm     G. Age:  21w 5d         24  %    FL/AC:      20.5   %    20 - 24  HUM:      36.2  mm     G. Age:  22w 5d         59  %  LV:          4  mm  Est. FW:     486  gm      1 lb 1 oz     48  % ---------------------------------------------------------------------- OB History  Gravidity:    4         Term:   2        Prem:   0        SAB:   1  TOP:          0       Ectopic:  0        Living: 2 ---------------------------------------------------------------------- Gestational Age  LMP:           22w 1d        Date:  07/07/19                 EDD:   04/12/20  U/S Today:     22w 0d                                        EDD:   04/13/20  Best:          22w 1d     Det. By:  LMP  (07/07/19)          EDD:   04/12/20 ---------------------------------------------------------------------- Anatomy  Cranium:               Appears normal         LVOT:                   Appears normal  Cavum:                 Appears normal         Aortic Arch:            Appears normal  Ventricles:  Appears normal         Ductal Arch:            Appears normal  Choroid Plexus:        Previously seen        Diaphragm:              Appears normal  Cerebellum:            Previously seen        Stomach:                Appears  normal, left                                                                        sided  Posterior Fossa:       Previously seen        Abdomen:                Appears normal  Nuchal Fold:           Not applicable (>20    Abdominal Wall:         Previously seen                         wks GA)  Face:                  Appears normal         Cord Vessels:           Previously seen                         (orbits and profile)  Lips:                  Previously seen        Kidneys:                Previously seen  Palate:                Previously seen        Bladder:                Previously seen  Thoracic:              Appears normal         Spine:                  Previously seen  Heart:                 Previously seen        Upper Extremities:      Previously seen  RVOT:                  Not well visualized    Lower Extremities:      Previously seen ---------------------------------------------------------------------- Cervix Uterus Adnexa  Cervix  Length:           4.41  cm.  Normal appearance by transabdominal scan. ---------------------------------------------------------------------- Comments  This patient was seen for a follow up growth scan due to  placenta previa that was noted during her last ultrasound  exam.  She denies any  problems since her last exam.  She was informed that the fetal growth and amniotic fluid  level appears appropriate for her gestational age.  A posterior placenta previa continues to be noted on today's  exam.  Precautions associated with placenta previa were  discussed.  A follow up exam was scheduled in 6 weeks assess the  placental location.  She was reassured that I anticipate that  the placenta previa will Little likely resolve later in her  pregnancy. ----------------------------------------------------------------------                   Ma Rings, MD Electronically Signed Final Report   12/09/2019 05:26 pm  ----------------------------------------------------------------------   Assessment and Plan:  Pregnancy: [id number] at [redacted]w[redacted]d 1. Supervision of other normal pregnancy, antepartum Stable Glucola next visit  2. History of herpes labialis Suppression at 36 weeks  3. Placenta previa in second trimester F/U U/S scheduled  Preterm labor symptoms and general obstetric precautions including but not limited to vaginal bleeding, contractions, leaking of fluid and fetal movement were reviewed in detail with the patient. I discussed the assessment and treatment plan with the patient. The patient was provided an opportunity to ask questions and all were answered. The patient agreed with the plan and demonstrated an understanding of the instructions. The patient was advised to call back or seek an in-person office evaluation/go to MAU at [facility] for any urgent or concerning symptoms. Please refer to After Visit Summary for other counseling recommendations.   I provided 8 minutes of face-to-face time during this encounter.  Return in about 4 weeks (around 01/21/2020) for OB visit, face to face, fasting appt for Glucola, any provider.  Future Appointments  Date Time Provider Department Center  12/27/2019 11:45 AM [facility] PEC-PEC PEC  01/20/2020  1:45 PM WH-MFC NURSE WH-MFC MFC-US  01/20/2020  1:45 PM WH-MFC [place] 2 WH-MFCUS MFC-US    Hermina Staggers, MD Center for [facility], [facility]

## 2019-12-27 ENCOUNTER — Ambulatory Visit: Payer: Medicaid Other | Attending: Internal Medicine

## 2019-12-27 DIAGNOSIS — Z23 Encounter for immunization: Secondary | ICD-10-CM

## 2019-12-27 NOTE — Progress Notes (Signed)
   Covid-19 Vaccination Clinic  Name:  Amy Little    MRN: [id number] DOB: [birth year]  12/27/2019  [name] was observed post Covid-19 immunization for 15 minutes without incident. She was provided with Vaccine Information Sheet and instruction to access the V-Safe system.   [name] was instructed to call 911 with any severe reactions post vaccine: Marland Kitchen Difficulty breathing  . Swelling of face and throat  . A fast heartbeat  . A bad rash all over body  . Dizziness and weakness   Immunizations Administered    Name Date Dose VIS Date Route   Moderna COVID-19 Vaccine 12/27/2019 11:50 AM 0.5 mL 09/10/2019 Intramuscular   Manufacturer: Moderna   Lot: [id number]   NDC: 95974-718-55

## 2020-01-20 ENCOUNTER — Other Ambulatory Visit: Payer: Self-pay

## 2020-01-20 ENCOUNTER — Encounter (HOSPITAL_COMMUNITY): Payer: Self-pay | Admitting: *Deleted

## 2020-01-20 ENCOUNTER — Ambulatory Visit (HOSPITAL_COMMUNITY): Payer: Medicaid Other | Admitting: *Deleted

## 2020-01-20 ENCOUNTER — Ambulatory Visit (HOSPITAL_COMMUNITY)
Admission: RE | Admit: 2020-01-20 | Discharge: 2020-01-20 | Disposition: A | Discharge status: Home / Self Care | Payer: Medicaid Other | Source: Ambulatory Visit | Attending: Obstetrics and Gynecology | Admitting: Obstetrics and Gynecology

## 2020-01-20 ENCOUNTER — Other Ambulatory Visit (HOSPITAL_COMMUNITY): Payer: Self-pay | Admitting: Obstetrics

## 2020-01-20 DIAGNOSIS — Z3480 Encounter for supervision of other normal pregnancy, unspecified trimester: Secondary | ICD-10-CM | POA: Diagnosis present

## 2020-01-20 DIAGNOSIS — Z3A28 28 weeks gestation of pregnancy: Secondary | ICD-10-CM

## 2020-01-20 DIAGNOSIS — O4400 Placenta previa specified as without hemorrhage, unspecified trimester: Secondary | ICD-10-CM

## 2020-01-20 DIAGNOSIS — O4403 Placenta previa specified as without hemorrhage, third trimester: Secondary | ICD-10-CM

## 2020-01-20 DIAGNOSIS — Z362 Encounter for other antenatal screening follow-up: Secondary | ICD-10-CM

## 2020-01-21 ENCOUNTER — Inpatient Hospital Stay (HOSPITAL_BASED_OUTPATIENT_CLINIC_OR_DEPARTMENT_OTHER): Payer: Medicaid Other

## 2020-01-21 ENCOUNTER — Encounter (HOSPITAL_COMMUNITY): Payer: Self-pay | Admitting: Obstetrics and Gynecology

## 2020-01-21 ENCOUNTER — Ambulatory Visit (INDEPENDENT_AMBULATORY_CARE_PROVIDER_SITE_OTHER): Payer: Medicaid Other | Admitting: Advanced Practice Midwife

## 2020-01-21 ENCOUNTER — Other Ambulatory Visit: Payer: Medicaid Other

## 2020-01-21 ENCOUNTER — Inpatient Hospital Stay (HOSPITAL_COMMUNITY)
Admission: AD | Admit: 2020-01-21 | Discharge: 2020-01-21 | Disposition: A | Discharge status: Home / Self Care | Payer: Medicaid Other | Attending: Obstetrics and Gynecology | Admitting: Obstetrics and Gynecology

## 2020-01-21 VITALS — BP 136/84 | HR 98 | Wt 208.0 lb

## 2020-01-21 DIAGNOSIS — O368130 Decreased fetal movements, third trimester, not applicable or unspecified: Secondary | ICD-10-CM | POA: Insufficient documentation

## 2020-01-21 DIAGNOSIS — Z3480 Encounter for supervision of other normal pregnancy, unspecified trimester: Secondary | ICD-10-CM

## 2020-01-21 DIAGNOSIS — O368120 Decreased fetal movements, second trimester, not applicable or unspecified: Secondary | ICD-10-CM

## 2020-01-21 DIAGNOSIS — Z3A28 28 weeks gestation of pregnancy: Secondary | ICD-10-CM | POA: Diagnosis not present

## 2020-01-21 DIAGNOSIS — O368390 Maternal care for abnormalities of the fetal heart rate or rhythm, unspecified trimester, not applicable or unspecified: Secondary | ICD-10-CM

## 2020-01-21 DIAGNOSIS — Z87891 Personal history of nicotine dependence: Secondary | ICD-10-CM | POA: Diagnosis not present

## 2020-01-21 DIAGNOSIS — O1203 Gestational edema, third trimester: Secondary | ICD-10-CM

## 2020-01-21 DIAGNOSIS — O368393 Maternal care for abnormalities of the fetal heart rate or rhythm, unspecified trimester, fetus 3: Secondary | ICD-10-CM

## 2020-01-21 HISTORY — DX: Female pelvic inflammatory disease, unspecified: N73.9

## 2020-01-21 NOTE — Progress Notes (Signed)
   PRENATAL VISIT NOTE  Subjective:  Amy Little is a 29 y.o. [id number] at [redacted]w[redacted]d being seen today for ongoing prenatal care.  She is currently monitored for the following issues for this low-risk pregnancy and has ASCUS with positive high risk HPV cervical pap smear; Asthma; History of herpes labialis; MDD (major depressive disorder), recurrent episode, moderate (HCC); Supervision of other normal pregnancy, antepartum; and Placenta previa on their problem list.  Patient reports nausea from glucose test today, swelling in her hands sometimes, and decreased fetal movement this morning.  Contractions: Not present. Vag. Bleeding: None.  Movement: Present. Denies leaking of fluid.   The following portions of the patient's history were reviewed and updated as appropriate: allergies, current medications, past family history, past medical history, past social history, past surgical history and problem list.   Objective:   Vitals:   01/21/20 0939  BP: 136/84  Pulse: 98  Weight: 208 lb (94.3 kg)    Fetal Status: Fetal Heart Rate (bpm): 158   Movement: Present     General:  Alert, oriented and cooperative. Patient is in no acute distress.  Skin: Skin is warm and dry. No rash noted.   Cardiovascular: Normal heart rate noted  Respiratory: Normal respiratory effort, no problems with respiration noted  Abdomen: Soft, gravid, appropriate for gestational age.  Pain/Pressure: Absent     Pelvic: Cervical exam deferred        Extremities: Normal range of motion.  Edema: Trace  Mental Status: Normal mood and affect. Normal behavior. Normal judgment and thought content.   Assessment and Plan:  Pregnancy: S3M1962 at [redacted]w[redacted]d 1. Supervision of other normal pregnancy, antepartum --Anticipatory guidance about next visits/weeks of pregnancy given. --Next visit in 4 weeks, virtual  - Glucose Tolerance, 2 Hours w/1 Hour - CBC - HIV antibody (with reflex) - RPR  2. Decreased fetal movements in third  trimester, single or unspecified fetus --Usually has coffee in the am but did not with glucose testing today but less movement than usual, even after glucose drink. --NST today with 10 x 10 accels but with variable x 1, lasting 25-30 seconds down to 120s. --Per Dr Mayford Knife, can do modified BPP with AFI today but unable to perform in office due to staffing.  Pt does not want to go to [place] office for [place] without her husband.  --Pt to MAU for further monitoring/BPP   3. Edema during pregnancy in third trimester --Swelling of fingers/hands occurs sometimes at night when sleeping.  Not every night, hands will be shiny, hard to close fist with swelling.   --Discussed watching sodium the day before to see if swelling improves.  No s/sx of carpal tunnel.  Increase PO fluids --BP wnl, pt not taking at home so encouraged to take weekly on home cuff and enter into [employer].  Preterm labor symptoms and general obstetric precautions including but not limited to vaginal bleeding, contractions, leaking of fluid and fetal movement were reviewed in detail with the patient. Please refer to After Visit Summary for other counseling recommendations.   Return in about 4 weeks (around 02/18/2020).  Future Appointments  Date Time Provider Department Center  01/28/2020 11:30 AM [facility] PEC-PEC PEC    Sharen Counter, [place]

## 2020-01-21 NOTE — MAU Provider Note (Addendum)
History     CSN: [id number]  Arrival date and time: 01/21/20 1207   First Provider Initiated Contact with Patient 01/21/20 1300      Chief Complaint  Patient presents with  . Decreased Fetal Movement   HPI Amy Little is a 29 year old female [redacted]w[redacted]d, [id number] presenting with decreased fetal movement. Patient went to [facility] office for 1 hour glucose this morning. States she did not drink coffee this morning like she usually does and states this might be the reason she could not feel the baby move. Pt was told to come to MAU for BPP because husband would be allowed in the room with her. Denies vaginal bleeding, abdominal pain, and loss of fluid. Confirms that she has felt the baby move since being in the MAU.  Per Lattie Haw Leftwich-Kirby's note from [place] office, "NST today with 10 x 10 accels but with variable x 1, lasting 25-30 seconds down to 120s. Pt to MAU for further monitoring/BPP"  OB History     Gravida  4   Para  2   Term  2   Preterm      AB  1   Living  2      SAB  1   TAB      Ectopic      Multiple  0   Live Births  2           Past Medical History:  Diagnosis Date  . Asthma   . Bipolar disorder ([place])    "years ago, not sure if it's true"  . Depression   . Herpes genitalia    rare outbreaks  . HPV in female   . PID (pelvic inflammatory disease)   . Vaginal Pap smear, abnormal     Past Surgical History:  Procedure Laterality Date  . WISDOM TOOTH EXTRACTION      Family History  Problem Relation Age of Onset  . Thyroid disease Mother   . Depression Mother   . Anxiety disorder Mother   . ADD / ADHD Mother   . COPD Father   . Cancer Maternal Grandmother   . Cancer Maternal Grandfather   . COPD Paternal Grandmother     Social History   Tobacco Use  . Smoking status: Former Smoker    Packs/day: 0.50  . Smokeless tobacco: Never Used  . Tobacco comment: quit Dec 2020  Substance Use Topics  . Alcohol use: Not Currently    Comment: 1  beer/wine ocassionally  . Drug use: No    Allergies: No Known Allergies  Medications Prior to Admission  Medication Sig Dispense Refill Last Dose  . acetaminophen (TYLENOL) 500 MG tablet Take 500 mg by mouth every 6 (six) hours as needed for headache.      . albuterol (PROVENTIL HFA;VENTOLIN HFA) 108 (90 Base) MCG/ACT inhaler Inhale 2 puffs into the lungs every 6 (six) hours as needed for wheezing or shortness of breath.     Marland Kitchen albuterol (PROVENTIL) (2.5 MG/3ML) 0.083% nebulizer solution Take 3 mLs (2.5 mg total) by nebulization every 6 (six) hours as needed for wheezing or shortness of breath. (Patient not taking: Reported on 09/23/2019) 75 mL 0   . Blood Pressure Monitoring (BLOOD PRESSURE CUFF) MISC 1 Device by Does not apply route once a week. (Patient not taking: Reported on 11/21/2019) 1 each 0   . Prenatal Vit-DSS-Fe Fum-FA (PRENATAL 19) tablet Take by mouth.     . valACYclovir (VALTREX) 500 MG tablet as needed.  Review of Systems  Constitutional: Negative for appetite change and fatigue.  Gastrointestinal: Negative for abdominal pain.  Genitourinary: Negative for pelvic pain, vaginal bleeding, vaginal discharge and vaginal pain.   Physical Exam   Blood pressure 113/62, pulse 99, temperature 98.4 F (36.9 C), temperature source Oral, resp. rate 16, last menstrual period 07/07/2019.  Physical Exam  Constitutional: She is oriented to person, place, and time. She appears well-developed and well-nourished. No distress.  HENT:  Head: Normocephalic and atraumatic.  Right Ear: External ear normal.  Left Ear: External ear normal.  Eyes: Pupils are equal, round, and reactive to light. Conjunctivae and EOM are normal.  Respiratory: Effort normal. No respiratory distress.  Musculoskeletal:        General: Normal range of motion.     Cervical back: Normal range of motion.  Neurological: She is alert and oriented to person, place, and time.  Skin: Skin is warm and dry. She is not  diaphoretic.  Psychiatric: She has a normal mood and affect. Her behavior is normal. Judgment and thought content normal.    MAU Course  Procedures  MDM BPP 8/8 Impression  Biophysical profile performed secondary to decreased fetal movement.  Biophysical profile is 8/8  Good fetal movement and amniotic fluid  Assessment and Plan   1. BPP 8/8 Follow-up with prenatal care at [facility] as scheduled.  Decreased fetal movements in second trimester, single or unspecified fetus  Supervision of other normal pregnancy, antepartum   Nicole Kindred, PA-S 01/21/2020, 2:28 PM    Attestation of Supervision of Student:  I confirm that I have verified the information documented in the physician assistant student's note and that I have also personally reperformed the history, physical exam and all medical decision making activities.  I have verified that all services and findings are accurately documented in this student's note; and I agree with management and plan as outlined in the documentation. I have also made any necessary editorial changes.  -NST: baseline is 145, mod var, present acel, neg decels, no contractions.  -BPP is 8/8; no labs or cervical exam done as patient had no other complaints.  -Keep folllow up appt at [facility] as planned.    Marylene Land, CNM [facility] [employer] 01/21/2020 6:34 PM

## 2020-01-21 NOTE — Patient Instructions (Signed)
Third Trimester of Pregnancy The third trimester is from week 28 through week 40 (months 7 through 9). The third trimester is a time when the unborn baby (fetus) is growing rapidly. At the end of the ninth month, the fetus is about 20 inches in length and weighs 6-10 pounds. Body changes during your third trimester Your body will continue to go through many changes during pregnancy. The changes vary from woman to woman. During the third trimester:  Your weight will continue to increase. You can expect to gain 25-35 pounds (11-16 kg) by the end of the pregnancy.  You may begin to get stretch marks on your hips, abdomen, and breasts.  You may urinate more often because the fetus is moving lower into your pelvis and pressing on your bladder.  You may develop or continue to have heartburn. This is caused by increased hormones that slow down muscles in the digestive tract.  You may develop or continue to have constipation because increased hormones slow digestion and cause the muscles that push waste through your intestines to relax.  You may develop hemorrhoids. These are swollen veins (varicose veins) in the rectum that can itch or be painful.  You may develop swollen, bulging veins (varicose veins) in your legs.  You may have increased body aches in the pelvis, back, or thighs. This is due to weight gain and increased hormones that are relaxing your joints.  You may have changes in your hair. These can include thickening of your hair, rapid growth, and changes in texture. Some women also have hair loss during or after pregnancy, or hair that feels dry or thin. Your hair will most likely return to normal after your baby is born.  Your breasts will continue to grow and they will continue to become tender. A yellow fluid (colostrum) may leak from your breasts. This is the first milk you are producing for your baby.  Your belly button may stick out.  You may notice more swelling in your hands,  face, or ankles.  You may have increased tingling or numbness in your hands, arms, and legs. The skin on your belly may also feel numb.  You may feel short of breath because of your expanding uterus.  You may have more problems sleeping. This can be caused by the size of your belly, increased need to urinate, and an increase in your body's metabolism.  You may notice the fetus "dropping," or moving lower in your abdomen (lightening).  You may have increased vaginal discharge.  You may notice your joints feel loose and you may have pain around your pelvic bone. What to expect at prenatal visits You will have prenatal exams every 2 weeks until week 36. Then you will have weekly prenatal exams. During a routine prenatal visit:  You will be weighed to make sure you and the baby are growing normally.  Your blood pressure will be taken.  Your abdomen will be measured to track your baby's growth.  The fetal heartbeat will be listened to.  Any test results from the previous visit will be discussed.  You may have a cervical check near your due date to see if your cervix has softened or thinned (effaced).  You will be tested for Group B streptococcus. This happens between 35 and 37 weeks. Your health care provider may ask you:  What your birth plan is.  How you are feeling.  If you are feeling the baby move.  If you have had any abnormal   symptoms, such as leaking fluid, bleeding, severe headaches, or abdominal cramping.  If you are using any tobacco products, including cigarettes, chewing tobacco, and electronic cigarettes.  If you have any questions. Other tests or screenings that may be performed during your third trimester include:  Blood tests that check for low iron levels (anemia).  Fetal testing to check the health, activity level, and growth of the fetus. Testing is done if you have certain medical conditions or if there are problems during the pregnancy.  Nonstress test  (NST). This test checks the health of your baby to make sure there are no signs of problems, such as the baby not getting enough oxygen. During this test, a belt is placed around your belly. The baby is made to move, and its heart rate is monitored during movement. What is false labor? False labor is a condition in which you feel small, irregular tightenings of the muscles in the womb (contractions) that usually go away with rest, changing position, or drinking water. These are called Braxton Hicks contractions. Contractions may last for hours, days, or even weeks before true labor sets in. If contractions come at regular intervals, become more frequent, increase in intensity, or become painful, you should see your health care provider. What are the signs of labor?  Abdominal cramps.  Regular contractions that start at 10 minutes apart and become stronger and more frequent with time.  Contractions that start on the top of the uterus and spread down to the lower abdomen and back.  Increased pelvic pressure and dull back pain.  A watery or bloody mucus discharge that comes from the vagina.  Leaking of amniotic fluid. This is also known as your "water breaking." It could be a slow trickle or a gush. Let your health care provider know if it has a color or strange odor. If you have any of these signs, call your health care provider right away, even if it is before your due date. Follow these instructions at home: Medicines  Follow your health care provider's instructions regarding medicine use. Specific medicines may be either safe or unsafe to take during pregnancy.  Take a prenatal vitamin that contains at least 600 micrograms (mcg) of folic acid.  If you develop constipation, try taking a stool softener if your health care provider approves. Eating and drinking   Eat a balanced diet that includes fresh fruits and vegetables, whole grains, good sources of protein such as meat, eggs, or tofu,  and low-fat dairy. Your health care provider will help you determine the amount of weight gain that is right for you.  Avoid raw meat and uncooked cheese. These carry germs that can cause birth defects in the baby.  If you have low calcium intake from food, talk to your health care provider about whether you should take a daily calcium supplement.  Eat four or five small meals rather than three large meals a day.  Limit foods that are high in fat and processed sugars, such as fried and sweet foods.  To prevent constipation: ? Drink enough fluid to keep your urine clear or pale yellow. ? Eat foods that are high in fiber, such as fresh fruits and vegetables, whole grains, and beans. Activity  Exercise only as directed by your health care provider. Most women can continue their usual exercise routine during pregnancy. Try to exercise for 30 minutes at least 5 days a week. Stop exercising if you experience uterine contractions.  Avoid heavy lifting.  Do   not exercise in extreme heat or humidity, or at high altitudes.  Wear low-heel, comfortable shoes.  Practice good posture.  You may continue to have sex unless your health care provider tells you otherwise. Relieving pain and discomfort  Take frequent breaks and rest with your legs elevated if you have leg cramps or low back pain.  Take warm sitz baths to soothe any pain or discomfort caused by hemorrhoids. Use hemorrhoid cream if your health care provider approves.  Wear a good support bra to prevent discomfort from breast tenderness.  If you develop varicose veins: ? Wear support pantyhose or compression stockings as told by your healthcare provider. ? Elevate your feet for 15 minutes, 3-4 times a day. Prenatal care  Write down your questions. Take them to your prenatal visits.  Keep all your prenatal visits as told by your health care provider. This is important. Safety  Wear your seat belt at all times when driving.  Make  a list of emergency phone numbers, including numbers for family, friends, the hospital, and police and fire departments. General instructions  Avoid cat litter boxes and soil used by cats. These carry germs that can cause birth defects in the baby. If you have a cat, ask someone to clean the litter box for you.  Do not travel far distances unless it is absolutely necessary and only with the approval of your health care provider.  Do not use hot tubs, steam rooms, or saunas.  Do not drink alcohol.  Do not use any products that contain nicotine or tobacco, such as cigarettes and e-cigarettes. If you need help quitting, ask your health care provider.  Do not use any medicinal herbs or unprescribed drugs. These chemicals affect the formation and growth of the baby.  Do not douche or use tampons or scented sanitary pads.  Do not cross your legs for long periods of time.  To prepare for the arrival of your baby: ? Take prenatal classes to understand, practice, and ask questions about labor and delivery. ? Make a trial run to the hospital. ? Visit the hospital and tour the maternity area. ? Arrange for maternity or paternity leave through employers. ? Arrange for family and friends to take care of pets while you are in the hospital. ? Purchase a rear-facing car seat and make sure you know how to install it in your car. ? Pack your hospital bag. ? Prepare the baby's nursery. Make sure to remove all pillows and stuffed animals from the baby's crib to prevent suffocation.  Visit your dentist if you have not gone during your pregnancy. Use a soft toothbrush to brush your teeth and be gentle when you floss. Contact a health care provider if:  You are unsure if you are in labor or if your water has broken.  You become dizzy.  You have mild pelvic cramps, pelvic pressure, or nagging pain in your abdominal area.  You have lower back pain.  You have persistent nausea, vomiting, or  diarrhea.  You have an unusual or bad smelling vaginal discharge.  You have pain when you urinate. Get help right away if:  Your water breaks before 37 weeks.  You have regular contractions less than 5 minutes apart before 37 weeks.  You have a fever.  You are leaking fluid from your vagina.  You have spotting or bleeding from your vagina.  You have severe abdominal pain or cramping.  You have rapid weight loss or weight gain.  You have   shortness of breath with chest pain.  You notice sudden or extreme swelling of your face, hands, ankles, feet, or legs.  Your baby makes fewer than 10 movements in 2 hours.  You have severe headaches that do not go away when you take medicine.  You have vision changes. Summary  The third trimester is from week 28 through week 40, months 7 through 9. The third trimester is a time when the unborn baby (fetus) is growing rapidly.  During the third trimester, your discomfort may increase as you and your baby continue to gain weight. You may have abdominal, leg, and back pain, sleeping problems, and an increased need to urinate.  During the third trimester your breasts will keep growing and they will continue to become tender. A yellow fluid (colostrum) may leak from your breasts. This is the first milk you are producing for your baby.  False labor is a condition in which you feel small, irregular tightenings of the muscles in the womb (contractions) that eventually go away. These are called Braxton Hicks contractions. Contractions may last for hours, days, or even weeks before true labor sets in.  Signs of labor can include: abdominal cramps; regular contractions that start at 10 minutes apart and become stronger and more frequent with time; watery or bloody mucus discharge that comes from the vagina; increased pelvic pressure and dull back pain; and leaking of amniotic fluid. This information is not intended to replace advice given to you by your  health care provider. Make sure you discuss any questions you have with your health care provider. Document Revised: 01/17/2019 Document Reviewed: 11/01/2016 Elsevier Patient Education  2020 Elsevier Inc.  

## 2020-01-21 NOTE — Discharge Instructions (Signed)
Fetal Movement Counts Patient Name: ________________________________________________ Patient Due Date: ____________________ What is a fetal movement count?  A fetal movement count is the number of times that you feel your baby move during a certain amount of time. This may also be called a fetal kick count. A fetal movement count is recommended for every pregnant woman. You may be asked to start counting fetal movements as early as week 28 of your pregnancy. Pay attention to when your baby is most active. You may notice your baby's sleep and wake cycles. You may also notice things that make your baby move more. You should do a fetal movement count:  When your baby is normally most active.  At the same time each day. A good time to count movements is while you are resting, after having something to eat and drink. How do I count fetal movements? 1. Find a quiet, comfortable area. Sit, or lie down on your side. 2. Write down the date, the start time and stop time, and the number of movements that you felt between those two times. Take this information with you to your health care visits. 3. Write down your start time when you feel the first movement. 4. Count kicks, flutters, swishes, rolls, and jabs. You should feel at least 10 movements. 5. You may stop counting after you have felt 10 movements, or if you have been counting for 2 hours. Write down the stop time. 6. If you do not feel 10 movements in 2 hours, contact your health care provider for further instructions. Your health care provider may want to do additional tests to assess your baby's well-being. Contact a health care provider if:  You feel fewer than 10 movements in 2 hours.  Your baby is not moving like he or she usually does. Date: ____________ Start time: ____________ Stop time: ____________ Movements: ____________ Date: ____________ Start time: ____________ Stop time: ____________ Movements: ____________ Date: ____________  Start time: ____________ Stop time: ____________ Movements: ____________ Date: ____________ Start time: ____________ Stop time: ____________ Movements: ____________ Date: ____________ Start time: ____________ Stop time: ____________ Movements: ____________ Date: ____________ Start time: ____________ Stop time: ____________ Movements: ____________ Date: ____________ Start time: ____________ Stop time: ____________ Movements: ____________ Date: ____________ Start time: ____________ Stop time: ____________ Movements: ____________ Date: ____________ Start time: ____________ Stop time: ____________ Movements: ____________ This information is not intended to replace advice given to you by your health care provider. Make sure you discuss any questions you have with your health care provider. Document Revised: 05/16/2019 Document Reviewed: 05/16/2019 Elsevier Patient Education  2020 Elsevier Inc.  

## 2020-01-21 NOTE — MAU Note (Signed)
Decreased fetal movement.  Sent from office. Had variable while on monitor.  For further eval and BPP.

## 2020-01-22 LAB — CBC
Hematocrit: 36 % (ref 34.0–46.6)
Hemoglobin: 12.2 g/dL (ref 11.1–15.9)
MCH: 31.6 pg (ref 26.6–33.0)
MCHC: 33.9 g/dL (ref 31.5–35.7)
MCV: 93 fL (ref 79–97)
Platelets: 212 10*3/uL (ref 150–450)
RBC: 3.86 x10E6/uL (ref 3.77–5.28)
RDW: 12.4 % (ref 11.7–15.4)
WBC: 9.3 10*3/uL (ref 3.4–10.8)

## 2020-01-22 LAB — RPR: RPR Ser Ql: NONREACTIVE

## 2020-01-22 LAB — GLUCOSE TOLERANCE, 2 HOURS W/ 1HR
Glucose, 1 hour: 136 mg/dL (ref 65–179)
Glucose, 2 hour: 127 mg/dL (ref 65–152)
Glucose, Fasting: 81 mg/dL (ref 65–91)

## 2020-01-22 LAB — HIV ANTIBODY (ROUTINE TESTING W REFLEX): HIV Screen 4th Generation wRfx: NONREACTIVE

## 2020-01-23 ENCOUNTER — Telehealth: Payer: Self-pay

## 2020-01-23 NOTE — Telephone Encounter (Signed)
Rec'vd VM from pt regarding a rf? LVM for pt to c/b

## 2020-01-27 ENCOUNTER — Other Ambulatory Visit: Payer: Self-pay | Admitting: Advanced Practice Midwife

## 2020-01-27 ENCOUNTER — Telehealth: Payer: Self-pay | Admitting: Advanced Practice Midwife

## 2020-01-27 DIAGNOSIS — R12 Heartburn: Secondary | ICD-10-CM

## 2020-01-27 DIAGNOSIS — O26893 Other specified pregnancy related conditions, third trimester: Secondary | ICD-10-CM

## 2020-01-27 MED ORDER — FAMOTIDINE 20 MG PO TABS: 20.0000 mg | ORAL_TABLET | Freq: Two times a day (BID) | ORAL | 5 refills | Status: AC

## 2020-01-27 NOTE — Telephone Encounter (Signed)
Called pt to notify her of Pepcid 20 mg BID PRN prescription sent to her pharmacy.  Pt states understanding and denies other concerns today.  Her next appt is virtual OB appt on 02/18/20 with me.

## 2020-01-28 ENCOUNTER — Ambulatory Visit: Payer: Medicaid Other | Attending: Internal Medicine

## 2020-01-28 DIAGNOSIS — Z23 Encounter for immunization: Secondary | ICD-10-CM

## 2020-01-28 NOTE — Progress Notes (Signed)
   Covid-19 Vaccination Clinic  Name:  Amy Little    MRN: [id number] DOB: 1991-02-03  01/28/2020  Amy Little was observed post Covid-19 immunization for 15 minutes without incident. She was provided with Vaccine Information Sheet and instruction to access the V-Safe system.   Amy Little was instructed to call 911 with any severe reactions post vaccine: Marland Kitchen Difficulty breathing  . Swelling of face and throat  . A fast heartbeat  . A bad rash all over body  . Dizziness and weakness   Immunizations Administered    Name Date Dose VIS Date Route   Moderna COVID-19 Vaccine 01/28/2020 11:35 AM 0.5 mL 09/2019 Intramuscular   Manufacturer: Moderna   Lot: [id number]   NDC: 45364-680-32

## 2020-02-18 ENCOUNTER — Encounter: Payer: Medicaid Other | Admitting: Advanced Practice Midwife

## 2020-02-25 ENCOUNTER — Other Ambulatory Visit: Payer: Self-pay

## 2020-02-25 ENCOUNTER — Ambulatory Visit (INDEPENDENT_AMBULATORY_CARE_PROVIDER_SITE_OTHER): Payer: Medicaid Other | Admitting: Nurse Practitioner

## 2020-02-25 VITALS — BP 124/80 | HR 99 | Wt 213.0 lb

## 2020-02-25 DIAGNOSIS — Z8742 Personal history of other diseases of the female genital tract: Secondary | ICD-10-CM

## 2020-02-25 DIAGNOSIS — Z23 Encounter for immunization: Secondary | ICD-10-CM | POA: Diagnosis not present

## 2020-02-25 DIAGNOSIS — J4530 Mild persistent asthma, uncomplicated: Secondary | ICD-10-CM

## 2020-02-25 DIAGNOSIS — Z3480 Encounter for supervision of other normal pregnancy, unspecified trimester: Secondary | ICD-10-CM

## 2020-02-25 DIAGNOSIS — Z8619 Personal history of other infectious and parasitic diseases: Secondary | ICD-10-CM

## 2020-02-25 MED ORDER — VALACYCLOVIR HCL 500 MG PO TABS: 500.0000 mg | ORAL_TABLET | Freq: Two times a day (BID) | ORAL | 1 refills | Status: DC

## 2020-02-25 NOTE — Addendum Note (Signed)
Addended by: Marya Landry D on: 02/25/2020 03:11 PM   Modules accepted: Orders

## 2020-02-25 NOTE — Progress Notes (Signed)
    Subjective:  Amy Little is a 29 y.o. [id number] at [redacted]w[redacted]d being seen today for ongoing prenatal care.  She is currently monitored for the following issues for this low-risk pregnancy and has Hx of abnormal cervical Pap smear; Asthma; History of herpes labialis; MDD (major depressive disorder), recurrent episode, moderate (HCC); Supervision of other normal pregnancy, antepartum; and GAD (generalized anxiety disorder) on their problem list.  Patient reports shortness of breath that inhaler does not help to resolve, one short episode of blurred vision in one eye only with a normal BP.  Contractions: Irritability. Vag. Bleeding: None.  Movement: Present. Denies leaking of fluid.   The following portions of the patient's history were reviewed and updated as appropriate: allergies, current medications, past family history, past medical history, past social history, past surgical history and problem list. Problem list updated.  Objective:   Vitals:   02/25/20 1426  BP: 124/80  Pulse: 99  Weight: 213 lb (96.6 kg)    Fetal Status: Fetal Heart Rate (bpm): 135   Movement: Present     General:  Alert, oriented and cooperative. Patient is in no acute distress.  Skin: Skin is warm and dry. No rash noted.   Cardiovascular: Normal heart rate noted  Respiratory: Normal respiratory effort, no problems with respiration noted  Abdomen: Soft, gravid, appropriate for gestational age. Pain/Pressure: Present     Pelvic:  Cervical exam deferred        Extremities: Normal range of motion.  Edema: Trace  Mental Status: Normal mood and affect. Normal behavior. Normal judgment and thought content.   Urinalysis:      Assessment and Plan:  Pregnancy: [id number] at [redacted]w[redacted]d  1. Hx of abnormal cervical Pap smear   2. Supervision of other normal pregnancy, antepartum Wants TDAP today Had one episode of blurred vision in one eye with normal BP and no edema or headache after lots of screen time.  Resolved after  rest. Virtual visit in 2 weeks and will have an in person visit one week afterwards to have vaginal swabs done.  3. Mild persistent asthma, unspecified whether complicated Nebulizer is not helping with shortness of breath Not having any wheezing Reviewed medications to take for allergies Reviewed that shortness of breath may be normal at 33 weeks due to less room for lung expansion  4. History of herpes labialis Ordered Valtrex 500 mg PO BID for suppression prior to birth  Preterm labor symptoms and general obstetric precautions including but not limited to vaginal bleeding, contractions, leaking of fluid and fetal movement were reviewed in detail with the patient. Please refer to After Visit Summary for other counseling recommendations.  Return in about 2 weeks (around 03/10/2020) for virtual ROB.  Nolene Bernheim, RN, MSN, NP-BC Nurse Practitioner, [facility] for [employer], [facility] 02/25/2020 2:53 PM ]

## 2020-03-10 ENCOUNTER — Telehealth (INDEPENDENT_AMBULATORY_CARE_PROVIDER_SITE_OTHER): Payer: Medicaid Other | Admitting: Obstetrics & Gynecology

## 2020-03-10 ENCOUNTER — Encounter: Payer: Self-pay | Admitting: Obstetrics & Gynecology

## 2020-03-10 DIAGNOSIS — Z3480 Encounter for supervision of other normal pregnancy, unspecified trimester: Secondary | ICD-10-CM

## 2020-03-10 DIAGNOSIS — O98313 Other infections with a predominantly sexual mode of transmission complicating pregnancy, third trimester: Secondary | ICD-10-CM

## 2020-03-10 DIAGNOSIS — Z3A35 35 weeks gestation of pregnancy: Secondary | ICD-10-CM

## 2020-03-10 DIAGNOSIS — O99343 Other mental disorders complicating pregnancy, third trimester: Secondary | ICD-10-CM | POA: Diagnosis not present

## 2020-03-10 DIAGNOSIS — O3443 Maternal care for other abnormalities of cervix, third trimester: Secondary | ICD-10-CM | POA: Diagnosis not present

## 2020-03-10 NOTE — Progress Notes (Signed)
   TELEHEALTH OBSTETRICS VISIT ENCOUNTER NOTE  I connected with Amy Little on 03/10/20 at  3:30 PM EDT by telephone at home and verified that I am speaking with the correct person using two identifiers.   I discussed the limitations, risks, security and privacy concerns of performing an evaluation and management service by telephone and the availability of in person appointments. I also discussed with the patient that there may be a patient responsible charge related to this service. The patient expressed understanding and agreed to proceed.  Subjective:  Amy Little is a 29 y.o. [id number] at [redacted]w[redacted]d being followed for ongoing prenatal care.  She is currently monitored for the following issues for this high-risk pregnancy and has Hx of abnormal cervical Pap smear; Asthma; History of herpes labialis; MDD (major depressive disorder), recurrent episode, moderate (HCC); Supervision of other normal pregnancy, antepartum; and GAD (generalized anxiety disorder) on their problem list.  Patient reports backache and occasional contractions. Reports fetal movement. Denies any contractions, bleeding or leaking of fluid.   The following portions of the patient's history were reviewed and updated as appropriate: allergies, current medications, past family history, past medical history, past social history, past surgical history and problem list.   Objective:   General:  Alert, oriented and cooperative.   Mental Status: Normal mood and affect perceived. Normal judgment and thought content.  Rest of physical exam deferred due to type of encounter  Assessment and Plan:  Pregnancy: [id number] at [redacted]w[redacted]d 1. Supervision of other normal pregnancy, antepartum Cultures next visit.   Preterm labor symptoms and general obstetric precautions including but not limited to vaginal bleeding, contractions, leaking of fluid and fetal movement were reviewed in detail with the patient.  I discussed the assessment and treatment  plan with the patient. The patient was provided an opportunity to ask questions and all were answered. The patient agreed with the plan and demonstrated an understanding of the instructions. The patient was advised to call back or seek an in-person office evaluation/go to MAU at [facility] for any urgent or concerning symptoms. Please refer to After Visit Summary for other counseling recommendations.   I provided 12 minutes of non-face-to-face time during this encounter.  No follow-ups on file.  Future Appointments  Date Time Provider Department Center  03/17/2020  2:40 PM Marylene Land, CNM [facility] None    Malachy Chamber, MD Center for [facility], [facility]

## 2020-03-10 NOTE — Progress Notes (Signed)
Pt is on the phone preparing for virtual visit with provider, [redacted]w[redacted]d.

## 2020-03-17 ENCOUNTER — Other Ambulatory Visit: Payer: Self-pay

## 2020-03-17 ENCOUNTER — Ambulatory Visit (INDEPENDENT_AMBULATORY_CARE_PROVIDER_SITE_OTHER): Payer: Medicaid Other | Admitting: Student

## 2020-03-17 ENCOUNTER — Other Ambulatory Visit (HOSPITAL_COMMUNITY)
Admission: RE | Admit: 2020-03-17 | Discharge: 2020-03-17 | Disposition: A | Discharge status: Home / Self Care | Payer: Medicaid Other | Source: Ambulatory Visit | Attending: Student | Admitting: Student

## 2020-03-17 VITALS — BP 126/86 | HR 88 | Wt 215.0 lb

## 2020-03-17 DIAGNOSIS — Z3A36 36 weeks gestation of pregnancy: Secondary | ICD-10-CM

## 2020-03-17 DIAGNOSIS — Z3483 Encounter for supervision of other normal pregnancy, third trimester: Secondary | ICD-10-CM

## 2020-03-17 DIAGNOSIS — Z3480 Encounter for supervision of other normal pregnancy, unspecified trimester: Secondary | ICD-10-CM | POA: Diagnosis not present

## 2020-03-17 NOTE — Progress Notes (Signed)
Patient ID: Amy Little, female   DOB: 17-Jun-1991, 29 y.o.   MRN: [id number]   -still taking valtrex -answered questions about GBS -unsure about method ; ocp vs. iud , does not want nexplanon.     PRENATAL VISIT NOTE  Subjective:  Amy Little is a 29 y.o. [id number] at [redacted]w[redacted]d being seen today for ongoing prenatal care.  She is currently monitored for the following issues for this low-risk pregnancy and has Hx of abnormal cervical Pap smear; Asthma; History of herpes labialis; MDD (major depressive disorder), recurrent episode, moderate (HCC); Supervision of other normal pregnancy, antepartum; and GAD (generalized anxiety disorder) on their problem list.  Patient reports some nausea and dizzness at night; also has thrown up in the evening. Thinks it is because she ate too much, but she also gets sick if she doesn't eat enough. She is unsure about mehtods; does not want nexplanon and is worried about weight gain with IUD. does not think she can take a pill daily. . She has also noticed more of an increased discharge; no odor or pain. She wants to know if that's normal. She denies any HSV outbreaks; is taking valtrex daily.   Contractions: Not present. Vag. Bleeding: None.  Movement: Present. Denies leaking of fluid.   The following portions of the patient's history were reviewed and updated as appropriate: allergies, current medications, past family history, past medical history, past social history, past surgical history and problem list.   Objective:   Vitals:   03/17/20 1501  BP: 126/86  Pulse: 88  Weight: 215 lb (97.5 kg)    Fetal Status: Fetal Heart Rate (bpm): 134 Fundal Height: 38 cm Movement: Present  Presentation: Vertex  General:  Alert, oriented and cooperative. Patient is in no acute distress.  Skin: Skin is warm and dry. No rash noted.   Cardiovascular: Normal heart rate noted  Respiratory: Normal respiratory effort, no problems with respiration noted  Abdomen: Soft, gravid,  appropriate for gestational age.  Pain/Pressure: Present     Pelvic: Cervical exam performed in the presence of a chaperone        Extremities: Normal range of motion.  Edema: None  Mental Status: Normal mood and affect. Normal behavior. Normal judgment and thought content.   Assessment and Plan:  Pregnancy: [id number] at [redacted]w[redacted]d 1. Supervision of other normal pregnancy, antepartum -Vertex by [place] and exam, baby is low in pelvis but cervix is very posterior so not checked due to patient discomfort.  -discussed IUD vs. BC, patient will think about it.  -recommended smaller meals in the evenings.  - Strep Gp B NAA - Cervicovaginal ancillary only( [place])  Term labor symptoms and general obstetric precautions including but not limited to vaginal bleeding, contractions, leaking of fluid and fetal movement were reviewed in detail with the patient. Please refer to After Visit Summary for other counseling recommendations.   Return in about 1 week (around 03/24/2020), or for LROB on My chart and then in person visit at 38 and 39 weeks.  Future Appointments  Date Time Provider Department Center  03/24/2020  2:45 PM Brock Bad, MD [facility] None    Charlesetta Garibaldi [place], [place]

## 2020-03-17 NOTE — Progress Notes (Signed)
ROB    CC: Vaginal discharge  Pt notes nausea x 4 days around 11 pm every night. Notes Dizziness on yesterday morning patient states she felt as if she was about to faint.

## 2020-03-17 NOTE — Patient Instructions (Signed)
Levonorgestrel intrauterine device (IUD) What is this medicine? LEVONORGESTREL IUD (LEE voe nor jes trel) is a contraceptive (birth control) device. The device is placed inside the uterus by a healthcare professional. It is used to prevent pregnancy. This device can also be used to treat heavy bleeding that occurs during your period. This medicine may be used for other purposes; ask your health care provider or pharmacist if you have questions. COMMON BRAND NAME(S): Kyleena, LILETTA, Mirena, Skyla What should I tell my health care provider before I take this medicine? They need to know if you have any of these conditions:  abnormal Pap smear  cancer of the breast, uterus, or cervix  diabetes  endometritis  genital or pelvic infection now or in the past  have more than one sexual partner or your partner has more than one partner  heart disease  history of an ectopic or tubal pregnancy  immune system problems  IUD in place  liver disease or tumor  problems with blood clots or take blood-thinners  seizures  use intravenous drugs  uterus of unusual shape  vaginal bleeding that has not been explained  an unusual or allergic reaction to levonorgestrel, other hormones, silicone, or polyethylene, medicines, foods, dyes, or preservatives  pregnant or trying to get pregnant  breast-feeding How should I use this medicine? This device is placed inside the uterus by a health care professional. Talk to your pediatrician regarding the use of this medicine in children. Special care may be needed. Overdosage: If you think you have taken too much of this medicine contact a poison control center or emergency room at once. NOTE: This medicine is only for you. Do not share this medicine with others. What if I miss a dose? This does not apply. Depending on the brand of device you have inserted, the device will need to be replaced every 3 to 6 years if you wish to continue using this type  of birth control. What may interact with this medicine? Do not take this medicine with any of the following medications:  amprenavir  bosentan  fosamprenavir This medicine may also interact with the following medications:  aprepitant  armodafinil  barbiturate medicines for inducing sleep or treating seizures  bexarotene  boceprevir  griseofulvin  medicines to treat seizures like carbamazepine, ethotoin, felbamate, oxcarbazepine, phenytoin, topiramate  modafinil  pioglitazone  rifabutin  rifampin  rifapentine  some medicines to treat HIV infection like atazanavir, efavirenz, indinavir, lopinavir, nelfinavir, tipranavir, ritonavir  St. John's wort  warfarin This list may not describe all possible interactions. Give your health care provider a list of all the medicines, herbs, non-prescription drugs, or dietary supplements you use. Also tell them if you smoke, drink alcohol, or use illegal drugs. Some items may interact with your medicine. What should I watch for while using this medicine? Visit your doctor or health care professional for regular check ups. See your doctor if you or your partner has sexual contact with others, becomes HIV positive, or gets a sexual transmitted disease. This product does not protect you against HIV infection (AIDS) or other sexually transmitted diseases. You can check the placement of the IUD yourself by reaching up to the top of your vagina with clean fingers to feel the threads. Do not pull on the threads. It is a good habit to check placement after each menstrual period. Call your doctor right away if you feel more of the IUD than just the threads or if you cannot feel the threads at   all. The IUD may come out by itself. You may become pregnant if the device comes out. If you notice that the IUD has come out use a backup birth control method like condoms and call your health care provider. Using tampons will not change the position of the  IUD and are okay to use during your period. This IUD can be safely scanned with magnetic resonance imaging (MRI) only under specific conditions. Before you have an MRI, tell your healthcare provider that you have an IUD in place, and which type of IUD you have in place. What side effects may I notice from receiving this medicine? Side effects that you should report to your doctor or health care professional as soon as possible:  allergic reactions like skin rash, itching or hives, swelling of the face, lips, or tongue  fever, flu-like symptoms  genital sores  high blood pressure  no menstrual period for 6 weeks during use  pain, swelling, warmth in the leg  pelvic pain or tenderness  severe or sudden headache  signs of pregnancy  stomach cramping  sudden shortness of breath  trouble with balance, talking, or walking  unusual vaginal bleeding, discharge  yellowing of the eyes or skin Side effects that usually do not require medical attention (report to your doctor or health care professional if they continue or are bothersome):  acne  breast pain  change in sex drive or performance  changes in weight  cramping, dizziness, or faintness while the device is being inserted  headache  irregular menstrual bleeding within first 3 to 6 months of use  nausea This list may not describe all possible side effects. Call your doctor for medical advice about side effects. You may report side effects to FDA at 1-800-FDA-1088. Where should I keep my medicine? This does not apply. NOTE: This sheet is a summary. It may not cover all possible information. If you have questions about this medicine, talk to your doctor, pharmacist, or health care provider.  2020 Elsevier/Gold Standard (2018-08-07 13:22:01)  

## 2020-03-18 LAB — CERVICOVAGINAL ANCILLARY ONLY
Bacterial Vaginitis (gardnerella): NEGATIVE
Candida Glabrata: NEGATIVE
Candida Vaginitis: NEGATIVE
Chlamydia: NEGATIVE
Comment: NEGATIVE
Comment: NEGATIVE
Comment: NEGATIVE
Comment: NEGATIVE
Comment: NEGATIVE
Comment: NORMAL
Neisseria Gonorrhea: NEGATIVE
Trichomonas: NEGATIVE

## 2020-03-19 LAB — STREP GP B NAA: Strep Gp B NAA: POSITIVE — AB

## 2020-03-20 ENCOUNTER — Encounter: Payer: Self-pay | Admitting: Student

## 2020-03-20 DIAGNOSIS — B951 Streptococcus, group B, as the cause of diseases classified elsewhere: Secondary | ICD-10-CM | POA: Insufficient documentation

## 2020-03-24 ENCOUNTER — Telehealth (INDEPENDENT_AMBULATORY_CARE_PROVIDER_SITE_OTHER): Payer: Medicaid Other | Admitting: Obstetrics

## 2020-03-24 ENCOUNTER — Encounter: Payer: Self-pay | Admitting: Obstetrics

## 2020-03-24 DIAGNOSIS — Z3483 Encounter for supervision of other normal pregnancy, third trimester: Secondary | ICD-10-CM

## 2020-03-24 DIAGNOSIS — Z3480 Encounter for supervision of other normal pregnancy, unspecified trimester: Secondary | ICD-10-CM

## 2020-03-24 DIAGNOSIS — Z3A37 37 weeks gestation of pregnancy: Secondary | ICD-10-CM

## 2020-03-24 NOTE — Progress Notes (Signed)
   TELEHEALTH OBSTETRICS VISIT ENCOUNTER NOTE  I connected with Amy Little on 03/24/20 at  2:45 PM EDT by telephone at home and verified that I am speaking with the correct person using two identifiers.   I discussed the limitations, risks, security and privacy concerns of performing an evaluation and management service by telephone and the availability of in person appointments. I also discussed with the patient that there may be a patient responsible charge related to this service. The patient expressed understanding and agreed to proceed.  Subjective:  Amy Little is a 29 y.o. [id number] at [redacted]w[redacted]d being followed for ongoing prenatal care.  She is currently monitored for the following issues for this low-risk pregnancy and has Hx of abnormal cervical Pap smear; Asthma; History of herpes labialis; MDD (major depressive disorder), recurrent episode, moderate (HCC); Supervision of other normal pregnancy, antepartum; GAD (generalized anxiety disorder); and Positive GBS test on their problem list.  Patient reports heartburn. Reports fetal movement. Denies any contractions, bleeding or leaking of fluid.   The following portions of the patient's history were reviewed and updated as appropriate: allergies, current medications, past family history, past medical history, past social history, past surgical history and problem list.   Objective:   General:  Alert, oriented and cooperative.   Mental Status: Normal mood and affect perceived. Normal judgment and thought content.  Rest of physical exam deferred due to type of encounter  Assessment and Plan:  Pregnancy: [id number] at [redacted]w[redacted]d 1. Supervision of other normal pregnancy, antepartum   Term labor symptoms and general obstetric precautions including but not limited to vaginal bleeding, contractions, leaking of fluid and fetal movement were reviewed in detail with the patient.  I discussed the assessment and treatment plan with the patient. The  patient was provided an opportunity to ask questions and all were answered. The patient agreed with the plan and demonstrated an understanding of the instructions. The patient was advised to call back or seek an in-person office evaluation/go to MAU at [facility] for any urgent or concerning symptoms. Please refer to After Visit Summary for other counseling recommendations.   I provided 10 minutes of non-face-to-face time during this encounter.  Return in about 1 week (around 03/31/2020) for Telephone OB.   Coral Ceo, MD Center for [facility], [facility] 03/24/20

## 2020-03-24 NOTE — Progress Notes (Signed)
Virtual ROB   CC: None  

## 2020-03-31 ENCOUNTER — Telehealth (INDEPENDENT_AMBULATORY_CARE_PROVIDER_SITE_OTHER): Payer: Medicaid Other | Admitting: Obstetrics

## 2020-03-31 ENCOUNTER — Encounter: Payer: Self-pay | Admitting: Obstetrics

## 2020-03-31 DIAGNOSIS — Z3480 Encounter for supervision of other normal pregnancy, unspecified trimester: Secondary | ICD-10-CM

## 2020-03-31 DIAGNOSIS — Z3A38 38 weeks gestation of pregnancy: Secondary | ICD-10-CM

## 2020-03-31 DIAGNOSIS — Z3483 Encounter for supervision of other normal pregnancy, third trimester: Secondary | ICD-10-CM

## 2020-03-31 NOTE — Progress Notes (Signed)
S/w pt for virtual visit, pt reports fetal movement, denies contractions.

## 2020-03-31 NOTE — Progress Notes (Signed)
   TELEHEALTH OBSTETRICS VISIT ENCOUNTER NOTE  I connected with Amy Little on 03/31/20 at  1:30 PM EDT by telephone at home and verified that I am speaking with the correct person using two identifiers.   I discussed the limitations, risks, security and privacy concerns of performing an evaluation and management service by telephone and the availability of in person appointments. I also discussed with the patient that there may be a patient responsible charge related to this service. The patient expressed understanding and agreed to proceed.  Subjective:  Amy Little is a 29 y.o. [id number] at [redacted]w[redacted]d being followed for ongoing prenatal care.  She is currently monitored for the following issues for this low-risk pregnancy and has Hx of abnormal cervical Pap smear; Asthma; History of herpes labialis; MDD (major depressive disorder), recurrent episode, moderate (HCC); Supervision of other normal pregnancy, antepartum; GAD (generalized anxiety disorder); and Positive GBS test on their problem list.  Patient reports no complaints. Reports fetal movement. Denies any contractions, bleeding or leaking of fluid.   The following portions of the patient's history were reviewed and updated as appropriate: allergies, current medications, past family history, past medical history, past social history, past surgical history and problem list.   Objective:   General:  Alert, oriented and cooperative.   Mental Status: Normal mood and affect perceived. Normal judgment and thought content.  Rest of physical exam deferred due to type of encounter  Assessment and Plan:  Pregnancy: [id number] at [redacted]w[redacted]d 1. Supervision of other normal pregnancy, antepartum   Term labor symptoms and general obstetric precautions including but not limited to vaginal bleeding, contractions, leaking of fluid and fetal movement were reviewed in detail with the patient.  I discussed the assessment and treatment plan with the patient. The  patient was provided an opportunity to ask questions and all were answered. The patient agreed with the plan and demonstrated an understanding of the instructions. The patient was advised to call back or seek an in-person office evaluation/go to MAU at [facility] for any urgent or concerning symptoms. Please refer to After Visit Summary for other counseling recommendations.   I provided 10 minutes of non-face-to-face time during this encounter.  Return in about 1 week (around 04/07/2020) for ROB.  Future Appointments  Date Time Provider Department Center  04/07/2020  1:30 PM Brock Bad, MD [facility] None    Coral Ceo, MD Center for [facility], [facility] 03/31/20

## 2020-04-07 ENCOUNTER — Encounter: Payer: Self-pay | Admitting: Obstetrics

## 2020-04-07 ENCOUNTER — Ambulatory Visit (INDEPENDENT_AMBULATORY_CARE_PROVIDER_SITE_OTHER): Payer: Medicaid Other | Admitting: Obstetrics

## 2020-04-07 ENCOUNTER — Other Ambulatory Visit: Payer: Self-pay

## 2020-04-07 VITALS — BP 114/79 | HR 92 | Wt 217.4 lb

## 2020-04-07 DIAGNOSIS — Z3480 Encounter for supervision of other normal pregnancy, unspecified trimester: Secondary | ICD-10-CM

## 2020-04-07 DIAGNOSIS — M545 Low back pain, unspecified: Secondary | ICD-10-CM

## 2020-04-07 DIAGNOSIS — O26892 Other specified pregnancy related conditions, second trimester: Secondary | ICD-10-CM

## 2020-04-07 DIAGNOSIS — Z3A39 39 weeks gestation of pregnancy: Secondary | ICD-10-CM

## 2020-04-07 LAB — POCT URINALYSIS DIPSTICK
Bilirubin, UA: NEGATIVE
Blood, UA: NEGATIVE
Glucose, UA: NEGATIVE
Ketones, UA: NEGATIVE
Leukocytes, UA: NEGATIVE
Nitrite, UA: NEGATIVE
Protein, UA: NEGATIVE
Spec Grav, UA: 1.015 (ref 1.010–1.025)
Urobilinogen, UA: 0.2 E.U./dL
pH, UA: 7.5 (ref 5.0–8.0)

## 2020-04-07 NOTE — Progress Notes (Signed)
Subjective:  Amy Little is a 29 y.o. [id number] at [redacted]w[redacted]d being seen today for ongoing prenatal care.  She is currently monitored for the following issues for this low-risk pregnancy and has Hx of abnormal cervical Pap smear; Asthma; History of herpes labialis; MDD (major depressive disorder), recurrent episode, moderate (HCC); Supervision of other normal pregnancy, antepartum; GAD (generalized anxiety disorder); and Positive GBS test on their problem list.  Patient reports backache and occasional contractions.  Contractions: Irregular. Vag. Bleeding: None.  Movement: Present. Denies leaking of fluid.   The following portions of the patient's history were reviewed and updated as appropriate: allergies, current medications, past family history, past medical history, past social history, past surgical history and problem list. Problem list updated.  Objective:   Vitals:   04/07/20 1348  BP: 114/79  Pulse: 92  Weight: 217 lb 6.4 oz (98.6 kg)    Fetal Status: Fetal Heart Rate (bpm): 140   Movement: Present     General:  Alert, oriented and cooperative. Patient is in no acute distress.  Skin: Skin is warm and dry. No rash noted.   Cardiovascular: Normal heart rate noted  Respiratory: Normal respiratory effort, no problems with respiration noted  Abdomen: Soft, gravid, appropriate for gestational age. Pain/Pressure: Present     Pelvic:  Cervical exam performed      2 cm / 50% / -2 / Vtx  Extremities: Normal range of motion.  Edema: Trace  Mental Status: Normal mood and affect. Normal behavior. Normal judgment and thought content.   Urinalysis:      Assessment and Plan:  Pregnancy: [id number] at [redacted]w[redacted]d  1. Supervision of other normal pregnancy, antepartum  2. Acute low back pain, unspecified back pain laterality, unspecified whether sciatica present Rx: - POCT Urinalysis Dipstick - Culture, OB Urine   Term labor symptoms and general obstetric precautions including but not limited to  vaginal bleeding, contractions, leaking of fluid and fetal movement were reviewed in detail with the patient. Please refer to After Visit Summary for other counseling recommendations.   Return in about 1 week (around 04/14/2020) for ROB.   Brock Bad, MD  04/07/20

## 2020-04-07 NOTE — Progress Notes (Signed)
Pt is here for ROB, [redacted]w[redacted]d.

## 2020-04-08 ENCOUNTER — Telehealth (HOSPITAL_COMMUNITY): Payer: Self-pay | Admitting: *Deleted

## 2020-04-08 ENCOUNTER — Encounter (HOSPITAL_COMMUNITY): Payer: Self-pay | Admitting: *Deleted

## 2020-04-08 ENCOUNTER — Other Ambulatory Visit: Payer: Self-pay

## 2020-04-08 ENCOUNTER — Encounter (HOSPITAL_COMMUNITY): Payer: Self-pay | Admitting: Obstetrics & Gynecology

## 2020-04-08 ENCOUNTER — Inpatient Hospital Stay (HOSPITAL_COMMUNITY)
Admission: AD | Admit: 2020-04-08 | Discharge: 2020-04-11 | DRG: 806 | Disposition: A | Discharge status: Home / Self Care | Payer: Medicaid Other | Attending: Obstetrics & Gynecology | Admitting: Obstetrics & Gynecology

## 2020-04-08 DIAGNOSIS — Z3A39 39 weeks gestation of pregnancy: Secondary | ICD-10-CM

## 2020-04-08 DIAGNOSIS — Z3480 Encounter for supervision of other normal pregnancy, unspecified trimester: Secondary | ICD-10-CM

## 2020-04-08 DIAGNOSIS — A6000 Herpesviral infection of urogenital system, unspecified: Secondary | ICD-10-CM | POA: Diagnosis present

## 2020-04-08 DIAGNOSIS — O9952 Diseases of the respiratory system complicating childbirth: Secondary | ICD-10-CM | POA: Diagnosis present

## 2020-04-08 DIAGNOSIS — F411 Generalized anxiety disorder: Secondary | ICD-10-CM | POA: Diagnosis present

## 2020-04-08 DIAGNOSIS — Z87891 Personal history of nicotine dependence: Secondary | ICD-10-CM

## 2020-04-08 DIAGNOSIS — B951 Streptococcus, group B, as the cause of diseases classified elsewhere: Secondary | ICD-10-CM | POA: Diagnosis present

## 2020-04-08 DIAGNOSIS — O4292 Full-term premature rupture of membranes, unspecified as to length of time between rupture and onset of labor: Principal | ICD-10-CM | POA: Diagnosis present

## 2020-04-08 DIAGNOSIS — O479 False labor, unspecified: Secondary | ICD-10-CM

## 2020-04-08 DIAGNOSIS — J45909 Unspecified asthma, uncomplicated: Secondary | ICD-10-CM | POA: Diagnosis present

## 2020-04-08 DIAGNOSIS — Z20822 Contact with and (suspected) exposure to covid-19: Secondary | ICD-10-CM | POA: Diagnosis present

## 2020-04-08 DIAGNOSIS — O99824 Streptococcus B carrier state complicating childbirth: Secondary | ICD-10-CM | POA: Diagnosis present

## 2020-04-08 DIAGNOSIS — Z8619 Personal history of other infectious and parasitic diseases: Secondary | ICD-10-CM | POA: Diagnosis present

## 2020-04-08 DIAGNOSIS — O9832 Other infections with a predominantly sexual mode of transmission complicating childbirth: Secondary | ICD-10-CM | POA: Diagnosis present

## 2020-04-08 NOTE — Telephone Encounter (Signed)
Preadmission screen  

## 2020-04-08 NOTE — MAU Note (Signed)
Ctxs since yesterday. Ctx closer tonight every 10-36mins. Having more pelvic pressure. 2cm and 50% in office yesterday. Denies VB or LOF. Baby was not moving as much tonight but then felt more movement while in lobby

## 2020-04-09 ENCOUNTER — Encounter (HOSPITAL_COMMUNITY): Payer: Self-pay | Admitting: Obstetrics & Gynecology

## 2020-04-09 ENCOUNTER — Inpatient Hospital Stay (HOSPITAL_COMMUNITY): Payer: Medicaid Other | Admitting: Anesthesiology

## 2020-04-09 DIAGNOSIS — O99820 Streptococcus B carrier state complicating pregnancy: Secondary | ICD-10-CM

## 2020-04-09 DIAGNOSIS — O471 False labor at or after 37 completed weeks of gestation: Secondary | ICD-10-CM

## 2020-04-09 DIAGNOSIS — Z3A39 39 weeks gestation of pregnancy: Secondary | ICD-10-CM

## 2020-04-09 DIAGNOSIS — A6000 Herpesviral infection of urogenital system, unspecified: Secondary | ICD-10-CM | POA: Diagnosis present

## 2020-04-09 DIAGNOSIS — Z87891 Personal history of nicotine dependence: Secondary | ICD-10-CM | POA: Diagnosis not present

## 2020-04-09 DIAGNOSIS — J45909 Unspecified asthma, uncomplicated: Secondary | ICD-10-CM | POA: Diagnosis present

## 2020-04-09 DIAGNOSIS — O4202 Full-term premature rupture of membranes, onset of labor within 24 hours of rupture: Secondary | ICD-10-CM | POA: Diagnosis not present

## 2020-04-09 DIAGNOSIS — Z20822 Contact with and (suspected) exposure to covid-19: Secondary | ICD-10-CM | POA: Diagnosis present

## 2020-04-09 DIAGNOSIS — O9832 Other infections with a predominantly sexual mode of transmission complicating childbirth: Secondary | ICD-10-CM | POA: Diagnosis present

## 2020-04-09 DIAGNOSIS — O26893 Other specified pregnancy related conditions, third trimester: Secondary | ICD-10-CM | POA: Diagnosis present

## 2020-04-09 DIAGNOSIS — O9952 Diseases of the respiratory system complicating childbirth: Secondary | ICD-10-CM | POA: Diagnosis present

## 2020-04-09 DIAGNOSIS — O4292 Full-term premature rupture of membranes, unspecified as to length of time between rupture and onset of labor: Secondary | ICD-10-CM | POA: Diagnosis present

## 2020-04-09 DIAGNOSIS — O99824 Streptococcus B carrier state complicating childbirth: Secondary | ICD-10-CM | POA: Diagnosis not present

## 2020-04-09 LAB — CBC
HCT: 33.5 % — ABNORMAL LOW (ref 36.0–46.0)
Hemoglobin: 11.2 g/dL — ABNORMAL LOW (ref 12.0–15.0)
MCH: 31.2 pg (ref 26.0–34.0)
MCHC: 33.4 g/dL (ref 30.0–36.0)
MCV: 93.3 fL (ref 80.0–100.0)
Platelets: 205 10*3/uL (ref 150–400)
RBC: 3.59 MIL/uL — ABNORMAL LOW (ref 3.87–5.11)
RDW: 13.5 % (ref 11.5–15.5)
WBC: 9.9 10*3/uL (ref 4.0–10.5)
nRBC: 0 % (ref 0.0–0.2)

## 2020-04-09 LAB — RPR: RPR Ser Ql: NONREACTIVE

## 2020-04-09 LAB — CULTURE, OB URINE

## 2020-04-09 LAB — URINE CULTURE, OB REFLEX

## 2020-04-09 LAB — TYPE AND SCREEN
ABO/RH(D): A POS
Antibody Screen: NEGATIVE

## 2020-04-09 LAB — SARS CORONAVIRUS 2 BY RT PCR (HOSPITAL ORDER, PERFORMED IN ~~LOC~~ HOSPITAL LAB): SARS Coronavirus 2: NEGATIVE

## 2020-04-09 LAB — ABO/RH: ABO/RH(D): A POS

## 2020-04-09 MED ORDER — PHENYLEPHRINE 40 MCG/ML (10ML) SYRINGE FOR IV PUSH (FOR BLOOD PRESSURE SUPPORT)
80.0000 ug | PREFILLED_SYRINGE | INTRAVENOUS | Status: DC | PRN
  Filled 2020-04-09: qty 10

## 2020-04-09 MED ORDER — IBUPROFEN 600 MG PO TABS
600.0000 mg | ORAL_TABLET | Freq: Four times a day (QID) | ORAL | Status: DC
  Administered 2020-04-09 – 2020-04-11 (×8): 600 mg via ORAL
  Filled 2020-04-09 (×8): qty 1

## 2020-04-09 MED ORDER — OXYCODONE HCL 5 MG PO TABS
10.0000 mg | ORAL_TABLET | ORAL | Status: DC | PRN
  Administered 2020-04-09 – 2020-04-10 (×2): 10 mg via ORAL
  Filled 2020-04-09 (×2): qty 2

## 2020-04-09 MED ORDER — SENNOSIDES-DOCUSATE SODIUM 8.6-50 MG PO TABS
2.0000 | ORAL_TABLET | ORAL | Status: DC
  Administered 2020-04-09 – 2020-04-10 (×2): 2 via ORAL
  Filled 2020-04-09 (×2): qty 2

## 2020-04-09 MED ORDER — ACETAMINOPHEN 325 MG PO TABS
650.0000 mg | ORAL_TABLET | ORAL | Status: DC | PRN
  Administered 2020-04-09 – 2020-04-10 (×4): 650 mg via ORAL
  Filled 2020-04-09 (×4): qty 2

## 2020-04-09 MED ORDER — EPHEDRINE 5 MG/ML INJ: 10.0000 mg | INTRAVENOUS | Status: DC | PRN

## 2020-04-09 MED ORDER — TETANUS-DIPHTH-ACELL PERTUSSIS 5-2.5-18.5 LF-MCG/0.5 IM SUSP: 0.5000 mL | Freq: Once | INTRAMUSCULAR | Status: DC

## 2020-04-09 MED ORDER — FENTANYL-BUPIVACAINE-NACL 0.5-0.125-0.9 MG/250ML-% EP SOLN
EPIDURAL | Status: AC
  Filled 2020-04-09: qty 250

## 2020-04-09 MED ORDER — FENTANYL CITRATE (PF) 100 MCG/2ML IJ SOLN: 50.0000 ug | INTRAMUSCULAR | Status: DC | PRN

## 2020-04-09 MED ORDER — BENZOCAINE-MENTHOL 20-0.5 % EX AERO
1.0000 "application " | INHALATION_SPRAY | CUTANEOUS | Status: DC | PRN
  Administered 2020-04-09 – 2020-04-11 (×2): 1 via TOPICAL
  Filled 2020-04-09 (×3): qty 56

## 2020-04-09 MED ORDER — PRENATAL MULTIVITAMIN CH
1.0000 | ORAL_TABLET | Freq: Every day | ORAL | Status: DC
  Administered 2020-04-10 – 2020-04-11 (×2): 1 via ORAL
  Filled 2020-04-09 (×2): qty 1

## 2020-04-09 MED ORDER — OXYTOCIN-SODIUM CHLORIDE 30-0.9 UT/500ML-% IV SOLN: 1.0000 m[IU]/min | INTRAVENOUS | Status: DC

## 2020-04-09 MED ORDER — TERBUTALINE SULFATE 1 MG/ML IJ SOLN: 0.2500 mg | Freq: Once | INTRAMUSCULAR | Status: DC | PRN

## 2020-04-09 MED ORDER — SIMETHICONE 80 MG PO CHEW: 80.0000 mg | CHEWABLE_TABLET | ORAL | Status: DC | PRN

## 2020-04-09 MED ORDER — WITCH HAZEL-GLYCERIN EX PADS: 1.0000 "application " | MEDICATED_PAD | CUTANEOUS | Status: DC | PRN

## 2020-04-09 MED ORDER — DIBUCAINE (PERIANAL) 1 % EX OINT: 1.0000 "application " | TOPICAL_OINTMENT | CUTANEOUS | Status: DC | PRN

## 2020-04-09 MED ORDER — SOD CITRATE-CITRIC ACID 500-334 MG/5ML PO SOLN: 30.0000 mL | ORAL | Status: DC | PRN

## 2020-04-09 MED ORDER — LACTATED RINGERS IV SOLN
500.0000 mL | INTRAVENOUS | Status: DC | PRN
  Administered 2020-04-09: 1000 mL via INTRAVENOUS

## 2020-04-09 MED ORDER — ONDANSETRON HCL 4 MG/2ML IJ SOLN: 4.0000 mg | INTRAMUSCULAR | Status: DC | PRN

## 2020-04-09 MED ORDER — MEASLES, MUMPS & RUBELLA VAC IJ SOLR: 0.5000 mL | Freq: Once | INTRAMUSCULAR | Status: DC

## 2020-04-09 MED ORDER — OXYTOCIN-SODIUM CHLORIDE 30-0.9 UT/500ML-% IV SOLN
2.5000 [IU]/h | INTRAVENOUS | Status: DC
  Filled 2020-04-09: qty 500

## 2020-04-09 MED ORDER — ONDANSETRON HCL 4 MG/2ML IJ SOLN
4.0000 mg | Freq: Four times a day (QID) | INTRAMUSCULAR | Status: DC | PRN
  Administered 2020-04-09: 4 mg via INTRAVENOUS
  Filled 2020-04-09: qty 2

## 2020-04-09 MED ORDER — PHENYLEPHRINE 40 MCG/ML (10ML) SYRINGE FOR IV PUSH (FOR BLOOD PRESSURE SUPPORT)
80.0000 ug | PREFILLED_SYRINGE | INTRAVENOUS | Status: DC | PRN
  Administered 2020-04-09: 80 ug via INTRAVENOUS

## 2020-04-09 MED ORDER — SODIUM CHLORIDE (PF) 0.9 % IJ SOLN
INTRAMUSCULAR | Status: DC | PRN
  Administered 2020-04-09: 12 mL/h via EPIDURAL

## 2020-04-09 MED ORDER — ALBUTEROL SULFATE (2.5 MG/3ML) 0.083% IN NEBU: 2.5000 mg | INHALATION_SOLUTION | Freq: Four times a day (QID) | RESPIRATORY_TRACT | Status: DC | PRN

## 2020-04-09 MED ORDER — LACTATED RINGERS IV SOLN: INTRAVENOUS | Status: DC

## 2020-04-09 MED ORDER — ACETAMINOPHEN 325 MG PO TABS
650.0000 mg | ORAL_TABLET | ORAL | Status: DC | PRN
  Administered 2020-04-09: 650 mg via ORAL
  Filled 2020-04-09: qty 2

## 2020-04-09 MED ORDER — EPHEDRINE 5 MG/ML INJ
10.0000 mg | INTRAVENOUS | Status: DC | PRN
  Filled 2020-04-09: qty 2

## 2020-04-09 MED ORDER — OXYTOCIN BOLUS FROM INFUSION
333.0000 mL | Freq: Once | INTRAVENOUS | Status: AC
  Administered 2020-04-09: 333 mL via INTRAVENOUS

## 2020-04-09 MED ORDER — LIDOCAINE HCL (PF) 1 % IJ SOLN
INTRAMUSCULAR | Status: DC | PRN
  Administered 2020-04-09: 10 mL via EPIDURAL

## 2020-04-09 MED ORDER — PENICILLIN G POT IN DEXTROSE 60000 UNIT/ML IV SOLN
3.0000 10*6.[IU] | INTRAVENOUS | Status: DC
  Administered 2020-04-09: 3 10*6.[IU] via INTRAVENOUS
  Filled 2020-04-09: qty 50

## 2020-04-09 MED ORDER — LIDOCAINE HCL (PF) 1 % IJ SOLN: 30.0000 mL | INTRAMUSCULAR | Status: DC | PRN

## 2020-04-09 MED ORDER — LACTATED RINGERS IV SOLN
500.0000 mL | Freq: Once | INTRAVENOUS | Status: AC
  Administered 2020-04-09: 500 mL via INTRAVENOUS

## 2020-04-09 MED ORDER — DOCUSATE SODIUM 100 MG PO CAPS
100.0000 mg | ORAL_CAPSULE | Freq: Two times a day (BID) | ORAL | Status: DC
  Administered 2020-04-09 – 2020-04-11 (×4): 100 mg via ORAL
  Filled 2020-04-09 (×4): qty 1

## 2020-04-09 MED ORDER — COCONUT OIL OIL: 1.0000 "application " | TOPICAL_OIL | Status: DC | PRN

## 2020-04-09 MED ORDER — ZOLPIDEM TARTRATE 5 MG PO TABS: 5.0000 mg | ORAL_TABLET | Freq: Every evening | ORAL | Status: DC | PRN

## 2020-04-09 MED ORDER — OXYTOCIN-SODIUM CHLORIDE 30-0.9 UT/500ML-% IV SOLN
1.0000 m[IU]/min | INTRAVENOUS | Status: DC
  Administered 2020-04-09: 2 m[IU]/min via INTRAVENOUS

## 2020-04-09 MED ORDER — FENTANYL-BUPIVACAINE-NACL 0.5-0.125-0.9 MG/250ML-% EP SOLN: 12.0000 mL/h | EPIDURAL | Status: DC | PRN

## 2020-04-09 MED ORDER — DIPHENHYDRAMINE HCL 25 MG PO CAPS: 25.0000 mg | ORAL_CAPSULE | Freq: Four times a day (QID) | ORAL | Status: DC | PRN

## 2020-04-09 MED ORDER — DIPHENHYDRAMINE HCL 50 MG/ML IJ SOLN: 12.5000 mg | INTRAMUSCULAR | Status: DC | PRN

## 2020-04-09 MED ORDER — OXYCODONE HCL 5 MG PO TABS
5.0000 mg | ORAL_TABLET | ORAL | Status: DC | PRN
  Administered 2020-04-10 (×2): 5 mg via ORAL
  Filled 2020-04-09 (×2): qty 1

## 2020-04-09 MED ORDER — SODIUM CHLORIDE 0.9 % IV SOLN
5.0000 10*6.[IU] | Freq: Once | INTRAVENOUS | Status: AC
  Administered 2020-04-09: 5 10*6.[IU] via INTRAVENOUS
  Filled 2020-04-09: qty 5

## 2020-04-09 MED ORDER — ONDANSETRON HCL 4 MG PO TABS: 4.0000 mg | ORAL_TABLET | ORAL | Status: DC | PRN

## 2020-04-09 NOTE — Progress Notes (Signed)
Baby moving and pt aware of FM 

## 2020-04-09 NOTE — Progress Notes (Signed)
Dr Crissie Reese notified of pt's repeat sve. Will admit to [facility]

## 2020-04-09 NOTE — Anesthesia Procedure Notes (Signed)
Epidural Patient location during procedure: OB Start time: 04/09/2020 6:15 AM End time: 04/09/2020 6:24 AM  Staffing Anesthesiologist: Lucretia Kern, MD Performed: anesthesiologist   Preanesthetic Checklist Completed: patient identified, IV checked, risks and benefits discussed, monitors and equipment checked, pre-op evaluation and timeout performed  Epidural Patient position: sitting Prep: DuraPrep Patient monitoring: heart rate, continuous pulse ox and blood pressure Approach: midline Location: L3-L4 Injection technique: LOR air  Needle:  Needle type: Tuohy  Needle gauge: 17 G Needle length: 9 cm Needle insertion depth: 7 cm Catheter type: closed end flexible Catheter size: 19 Gauge Catheter at skin depth: 12 cm Test dose: negative  Assessment Events: blood not aspirated, injection not painful, no injection resistance, no paresthesia and negative IV test  Additional Notes Reason for block:procedure for pain

## 2020-04-09 NOTE — Progress Notes (Signed)
PT lives away and would like to be rechecked one more time. SHe is GBS + and concerned about getting antibiotics in time. Will recheck in an hour or so

## 2020-04-09 NOTE — Discharge Summary (Signed)
Postpartum Discharge Summary    Patient Name: Amy Little DOB: Oct 25, 1990 MRN: [id number]  Date of admission: 04/08/2020 Delivery date:04/09/2020  Delivering provider: Krystal Eaton B  Date of discharge: 04/11/2020  Admitting diagnosis: Normal labor [O80, Z37.9] Intrauterine pregnancy: [redacted]w[redacted]d    Secondary diagnosis:  Active Problems:   History of herpes labialis   Supervision of other normal pregnancy, antepartum   GAD (generalized anxiety disorder)   Positive GBS test   Normal labor  Additional problems: None    Discharge diagnosis: Term Pregnancy Delivered                                              Post partum procedures:None Augmentation: Pitocin Complications: None  Hospital course: Onset of Labor With Vaginal Delivery      29y.o. yo [id number] 350w4das admitted in Latent Labor on 04/08/2020. Patient had an uncomplicated labor course as follows:  Membrane Rupture Time/Date: 6:25 AM ,04/09/2020   Delivery Method:Vaginal, Spontaneous  Episiotomy:   Lacerations:  2nd degree;Perineal  Patient had an uncomplicated postpartum course.  She is ambulating, tolerating a regular diet, passing flatus, and urinating well. Patient is discharged home in stable condition on 04/11/20.  Newborn Data: Birth date:04/09/2020  Birth time:1:16 PM  Gender:Female  Living status:Living  Apgars:9 ,9  Weight:4306 g   Magnesium Sulfate received: No BMZ received: No Rhophylac:N/A MMR:N/A T-DaP:Given postpartum Flu: N/A Transfusion:No  Physical exam  Vitals:   04/10/20 0531 04/10/20 1515 04/10/20 2239 04/11/20 0519  BP: 116/75 (!) 105/52 (!) 115/58 122/80  Pulse: 73 89 79 80  Resp:  '18 18 18  ' Temp: 97.8 F (36.6 C) 98.6 F (37 C) 98.4 F (36.9 C) 97.6 F (36.4 C)  TempSrc: Oral  Oral Oral  SpO2:  96%    Weight:      Height:       General: alert, cooperative and no distress Lochia: appropriate Uterine Fundus: firm Incision: N/A DVT Evaluation: No evidence of DVT seen on physical  exam. No significant calf/ankle edema. Labs: Lab Results  Component Value Date   WBC 9.9 04/09/2020   HGB 11.2 (L) 04/09/2020   HCT 33.5 (L) 04/09/2020   MCV 93.3 04/09/2020   PLT 205 04/09/2020   CMP Latest Ref Rng & Units 09/15/2019  Glucose 70 - 99 mg/dL 101(H)  BUN 6 - 20 mg/dL 7  Creatinine 0.44 - 1.00 mg/dL 0.51  Sodium 135 - 145 mmol/L 135  Potassium 3.5 - 5.1 mmol/L 3.7  Chloride 98 - 111 mmol/L 103  CO2 22 - 32 mmol/L 24  Calcium 8.9 - 10.3 mg/dL 9.2  Total Protein 6.5 - 8.1 g/dL -  Total Bilirubin 0.3 - 1.2 mg/dL -  Alkaline Phos 38 - 126 U/L -  AST 15 - 41 U/L -  ALT 14 - 54 U/L -   Edinburgh Score: Edinburgh Postnatal Depression Scale Screening Tool 04/09/2020  I have been able to laugh and see the funny side of things. 0  I have looked forward with enjoyment to things. 0  I have blamed myself unnecessarily when things went wrong. 1  I have been anxious or worried for no good reason. 3  I have felt scared or panicky for no good reason. 1  Things have been getting on top of me. 1  I have been so unhappy that I have had difficulty  sleeping. 0  I have felt sad or miserable. 0  I have been so unhappy that I have been crying. 0  The thought of harming myself has occurred to me. 0  Edinburgh Postnatal Depression Scale Total 6     After visit meds:  Allergies as of 04/11/2020   No Known Allergies     Medication List    STOP taking these medications   Blood Pressure Cuff Misc   valACYclovir 500 MG tablet Commonly known as: Valtrex     TAKE these medications   acetaminophen 500 MG tablet Commonly known as: TYLENOL Take 500 mg by mouth every 6 (six) hours as needed for headache.   albuterol 108 (90 Base) MCG/ACT inhaler Commonly known as: VENTOLIN HFA Inhale 2 puffs into the lungs every 6 (six) hours as needed for wheezing or shortness of breath.   albuterol (2.5 MG/3ML) 0.083% nebulizer solution Commonly known as: PROVENTIL Take 3 mLs (2.5 mg total) by  nebulization every 6 (six) hours as needed for wheezing or shortness of breath.   famotidine 20 MG tablet Commonly known as: Pepcid Take 1 tablet (20 mg total) by mouth 2 (two) times daily.   ibuprofen 600 MG tablet Commonly known as: ADVIL Take 1 tablet (600 mg total) by mouth every 6 (six) hours.   polyethylene glycol powder 17 GM/SCOOP powder Commonly known as: GLYCOLAX/MIRALAX Take 17 g by mouth daily as needed.   Prenatal 19 tablet Take 1 tablet by mouth daily.        Discharge home in stable condition Infant Feeding: Bottle Infant Disposition:home with mother Discharge instruction: per After Visit Summary and Postpartum booklet. Activity: Advance as tolerated. Pelvic rest for 6 weeks.  Diet: routine diet Future Appointments: Future Appointments  Date Time Provider [place]  05/08/2020 10:00 AM Woodroe Mode, MD [place] None   Follow up Visit:   Please schedule this patient for a In person postpartum visit in 4 weeks with the following provider: Any provider. Additional Postpartum F/U:IUD placement  Low risk pregnancy complicated by: NONE Delivery mode:  Vaginal, Spontaneous  Anticipated Birth Control:  IUD   04/11/2020 Clarnce Flock, MD

## 2020-04-09 NOTE — Progress Notes (Signed)
Dr Crissie Reese notified of pt's gradual change in cervical dilation and pt hesitant to go home due to living away and positive GBS. Will have M Williams CNM reck pt in an hour or so since she last checked pt

## 2020-04-09 NOTE — Anesthesia Preprocedure Evaluation (Signed)
Anesthesia Evaluation  Patient identified by MRN, date of birth, ID band Patient awake    Reviewed: Allergy & Precautions, H&P , NPO status , Patient's Chart, lab work & pertinent test results  History of Anesthesia Complications Negative for: history of anesthetic complications  Airway Mallampati: II  TM Distance: >3 FB Neck ROM: full    Dental no notable dental hx.    Pulmonary asthma , former smoker,    Pulmonary exam normal        Cardiovascular negative cardio ROS Normal cardiovascular exam Rhythm:regular Rate:Normal     Neuro/Psych negative neurological ROS  negative psych ROS   GI/Hepatic negative GI ROS, Neg liver ROS,   Endo/Other  negative endocrine ROS  Renal/GU negative Renal ROS  negative genitourinary   Musculoskeletal   Abdominal   Peds  Hematology negative hematology ROS (+)   Anesthesia Other Findings   Reproductive/Obstetrics (+) Pregnancy                             Anesthesia Physical Anesthesia Plan  ASA: II  Anesthesia Plan: Epidural   Post-op Pain Management:    Induction:   PONV Risk Score and Plan:   Airway Management Planned:   Additional Equipment:   Intra-op Plan:   Post-operative Plan:   Informed Consent: I have reviewed the patients History and Physical, chart, labs and discussed the procedure including the risks, benefits and alternatives for the proposed anesthesia with the patient or authorized representative who has indicated his/her understanding and acceptance.       Plan Discussed with:   Anesthesia Plan Comments:         Anesthesia Quick Evaluation  

## 2020-04-09 NOTE — Progress Notes (Signed)
Amy Little is a 29 y.o. [id number] at [redacted]w[redacted]d admitted for PROM, early labor  Subjective: Patient comfortable in room, with epidural in place. Discussed plan of labor, starting augmentation, patient agrees with plan.  Objective: BP 112/66    Pulse 94    Temp 97.6 F (36.4 C) (Oral)    Resp 18    Ht 5\' 7"  (1.702 m)    Wt 99.3 kg    LMP 07/07/2019 (Exact Date)    SpO2 97%    BMI 34.29 kg/m  No intake/output data recorded.  FHT:  FHR: 135 bpm, variability: moderate,  accelerations:  Present,  decelerations:  Absent UC:   irregular, every 4-6 minutes  SVE:   Dilation: 4 Effacement (%): 60 Station: -3 Exam by:: J.Cox, RN   Labs: Lab Results  Component Value Date   WBC 9.9 04/09/2020   HGB 11.2 (L) 04/09/2020   HCT 33.5 (L) 04/09/2020   MCV 93.3 04/09/2020   PLT 205 04/09/2020    Assessment / Plan: PROM, early labor, minimal progression  Labor: Adding pitocin for augmentation of labor Fetal Wellbeing:  Category I Pain Control:  Epidural Pre-eclampsia: N/A I/D:  GBS Pos, about to begin second dose of PCN Anticipated MOD:  NSVD  06/10/2020, DO [facility] 04/09/2020, 10:08 AM

## 2020-04-09 NOTE — Progress Notes (Signed)
Patient given pneumonia VIS. 

## 2020-04-09 NOTE — Progress Notes (Signed)
Pt feels FM but still states it is not as hard of movements like usual

## 2020-04-09 NOTE — MAU Note (Signed)
Covid swab obtained without difficulty and pt tol well. No symptoms. Has had Maderna Vaccine x 2

## 2020-04-09 NOTE — H&P (Signed)
LABOR AND DELIVERY ADMISSION HISTORY AND PHYSICAL NOTE  Amy Little is a 29 y.o. female [phone] with IUP at [redacted]w[redacted]d by 10wk [place] presenting for early labor.   She reports positive fetal movement. She denies leakage of fluid, vaginal bleeding, or contractions.   She denies any recent rashes.  She plans on bottle feeding. Her contraception plan is: outpatient IUD.  Prenatal History/Complications: PNC at [facility]:  @[redacted]w[redacted]d , CWD, normal anatomy, cephalic presentation, posterior placenta, 64%ile, EFW 1290g  Pregnancy complications:  - hx HSV - GAD - GBS positive  Past Medical History: Past Medical History:  Diagnosis Date  . Asthma   . Bipolar disorder (HCC)    "years ago, not sure if it's true"  . Depression   . Herpes genitalia    rare outbreaks  . HPV in female   . PID (pelvic inflammatory disease)   . Vaginal Pap smear, abnormal     Past Surgical History: Past Surgical History:  Procedure Laterality Date  . WISDOM TOOTH EXTRACTION      Obstetrical History: OB History    Gravida  4   Para  2   Term  2   Preterm      AB  1   Living  2     SAB  1   TAB      Ectopic      Multiple  0   Live Births  2           Social History: Social History   Socioeconomic History  . Marital status: Married    Spouse name: Not on file  . Number of children: Not on file  . Years of education: Not on file  . Highest education level: Not on file  Occupational History  . Not on file  Tobacco Use  . Smoking status: Former Smoker    Packs/day: 0.50  . Smokeless tobacco: Never Used  . Tobacco comment: quit Dec 2020  Vaping Use  . Vaping Use: Never used  Substance and Sexual Activity  . Alcohol use: Not Currently    Comment: 1 beer/wine ocassionally  . Drug use: No  . Sexual activity: Yes    Partners: Male    Birth control/protection: None  Other Topics Concern  . Not on file  Social History Narrative  . Not on file   Social Determinants of Health    Financial Resource Strain:   . Difficulty of Paying Living Expenses:   Food Insecurity:   . Worried About Jan 2021 in the Last Year:   . Programme researcher, broadcasting/film/video in the Last Year:   Transportation Needs:   . Barista (Medical):   Freight forwarder Lack of Transportation (Non-Medical):   Physical Activity:   . Days of Exercise per Week:   . Minutes of Exercise per Session:   Stress:   . Feeling of Stress :   Social Connections:   . Frequency of Communication with Friends and Family:   . Frequency of Social Gatherings with Friends and Family:   . Attends Religious Services:   . Active Member of Clubs or Organizations:   . Attends [employer] Meetings:   Banker Marital Status:     Family History: Family History  Problem Relation Age of Onset  . Thyroid disease Mother   . Depression Mother   . Anxiety disorder Mother   . ADD / ADHD Mother   . COPD Father   . Cancer Maternal Grandmother   .  Cancer Maternal Grandfather   . COPD Paternal Grandmother     Allergies: No Known Allergies  Medications Prior to Admission  Medication Sig Dispense Refill Last Dose  . acetaminophen (TYLENOL) 500 MG tablet Take 500 mg by mouth every 6 (six) hours as needed for headache.    04/07/2020 at Unknown time  . albuterol (PROVENTIL HFA;VENTOLIN HFA) 108 (90 Base) MCG/ACT inhaler Inhale 2 puffs into the lungs every 6 (six) hours as needed for wheezing or shortness of breath.   04/07/2020 at Unknown time  . famotidine (PEPCID) 20 MG tablet Take 1 tablet (20 mg total) by mouth 2 (two) times daily. 60 tablet 5 Past Week at Unknown time  . Prenatal Vit-DSS-Fe Fum-FA (PRENATAL 19) tablet Take by mouth.   04/07/2020 at Unknown time  . valACYclovir (VALTREX) 500 MG tablet Take 1 tablet (500 mg total) by mouth 2 (two) times daily. 60 tablet 1 04/07/2020 at Unknown time  . albuterol (PROVENTIL) (2.5 MG/3ML) 0.083% nebulizer solution Take 3 mLs (2.5 mg total) by nebulization every 6 (six) hours as needed  for wheezing or shortness of breath. (Patient not taking: Reported on 09/23/2019) 75 mL 0   . Blood Pressure Monitoring (BLOOD PRESSURE CUFF) MISC 1 Device by Does not apply route once a week. 1 each 0      Review of Systems  All systems reviewed and negative except as stated in HPI  Physical Exam Blood pressure (!) 113/57, pulse 83, temperature 98.4 F (36.9 C), resp. rate 18, height 5\' 7"  (1.702 m), weight 99.3 kg, last menstrual period 07/07/2019. General appearance: alert, oriented, NAD Lungs: normal respiratory effort Heart: regular rate Abdomen: soft, non-tender; gravid Extremities: No calf swelling or tenderness Presentation: cephalic by RN SVE  Fetal monitoringBaseline: 135 bpm, Variability: Good {> 6 bpm), Accelerations: Reactive and Decelerations: Absent Uterine activity: irregular q3-5 min  Dilation: 4 Effacement (%): 70, 60 Station: -3, -2 Exam by:: williams  Prenatal labs: ABO, Rh: A/Positive/-- (12/14 1436) Antibody: Negative (12/14 1436) Rubella: 2.94 (12/14 1436) RPR: Non Reactive (04/13 1101)  HBsAg: Negative (12/14 1436)  HIV: Non Reactive (04/13 1101)  GC/Chlamydia: neg/neg 03/17/2020  GBS: Positive/-- (06/08 0333)  2-hr GTT: normal 01/21/2020 01/23/2020) Genetic screening:  Low risk Panorama Anatomy ([phone]: normal  Prenatal Transfer Tool  Maternal Diabetes: No Genetic Screening: Normal Maternal Ultrasounds/Referrals: Normal Fetal Ultrasounds or other Referrals:  None Maternal Substance Abuse:  No Significant Maternal Medications:  Meds include: Other: Valtrex Significant Maternal Lab Results: Group B Strep positive  No results found for this or any previous visit (from the past 24 hour(s)).  Patient Active Problem List   Diagnosis Date Noted  . Normal labor 04/09/2020  . Positive GBS test 03/20/2020  . Supervision of other normal pregnancy, antepartum 08/30/2019  . GAD (generalized anxiety disorder) 06/24/2019  . MDD (major depressive disorder),  recurrent episode, moderate (HCC) 12/20/2017  . Asthma 10/25/2016  . History of herpes labialis 10/25/2016  . Hx of abnormal cervical Pap smear 05/06/2016    Assessment: Amy Little is a 29 y.o. 37 at [redacted]w[redacted]d here for early labor.  #Labor: Patient making cervical change in MAU and in early labor, in addition patient is term and lives over 40 minutes away. Treat GBS adequately then recheck, augment PRN with pit/AROM.  #Pain: IV pain meds PRN, epidural upon request #FWB: Cat I #GBS/ID: Positive, starting penicillin #COVID: swab pending #MOF: Bottle #MOC: outpatient IUD #Circ: Yes  #Hx HSV: no lesions on external exam, has been compliant with  valtrex suppression  Mary Sella [facility] 04/09/2020, 5:01 AM

## 2020-04-09 NOTE — MAU Provider Note (Signed)
Chief Complaint:  Contractions and Decreased Fetal Movement   First Provider Initiated Contact with Patient 04/09/20 0046     HPI: Amy Little is a 29 y.o. [id number] at 42w4dwho presents to maternity admissions reporting painful contractions and mentioned decreased fetal movement tonight. . She denies LOF, vaginal bleeding, vaginal itching/burning, urinary symptoms, h/a, dizziness, n/v, diarrhea, constipation or fever/chills.    Abdominal Pain This is a new problem. The current episode started today. The onset quality is gradual. The problem occurs intermittently. The problem has been unchanged. The quality of the pain is cramping. The abdominal pain does not radiate. Pertinent negatives include no constipation, diarrhea, dysuria, fever, frequency, myalgias, nausea or vomiting. Nothing aggravates the pain. The pain is relieved by nothing. She has tried nothing for the symptoms.    RN Note: Pt feels FM but still states it is not as hard of movements like usual  Past Medical History: Past Medical History:  Diagnosis Date  . Asthma   . Bipolar disorder (HCC)    "years ago, not sure if it's true"  . Depression   . Herpes genitalia    rare outbreaks  . HPV in female   . PID (pelvic inflammatory disease)   . Vaginal Pap smear, abnormal     Past obstetric history: OB History  Gravida Para Term Preterm AB Living  4 2 2   1 2   SAB TAB Ectopic Multiple Live Births  1     0 2    # Outcome Date GA Lbr Len/2nd Weight Sex Delivery Anes PTL Lv  4 Current           3 Term 11/30/16 [redacted]w[redacted]d 06:10 / 00:54 3340 g F Vag-Spont EPI  LIV  2 Term 2012 [redacted]w[redacted]d  4423 g M Vag-Spont EPI N LIV     Birth Comments: no diabetes  1 SAB             Past Surgical History: Past Surgical History:  Procedure Laterality Date  . WISDOM TOOTH EXTRACTION      Family History: Family History  Problem Relation Age of Onset  . Thyroid disease Mother   . Depression Mother   . Anxiety disorder Mother   . ADD /  ADHD Mother   . COPD Father   . Cancer Maternal Grandmother   . Cancer Maternal Grandfather   . COPD Paternal Grandmother     Social History: Social History   Tobacco Use  . Smoking status: Former Smoker    Packs/day: 0.50  . Smokeless tobacco: Never Used  . Tobacco comment: quit Dec 2020  Vaping Use  . Vaping Use: Never used  Substance Use Topics  . Alcohol use: Not Currently    Comment: 1 beer/wine ocassionally  . Drug use: No    Allergies: No Known Allergies  Meds:  Medications Prior to Admission  Medication Sig Dispense Refill Last Dose  . acetaminophen (TYLENOL) 500 MG tablet Take 500 mg by mouth every 6 (six) hours as needed for headache.    04/07/2020 at Unknown time  . albuterol (PROVENTIL HFA;VENTOLIN HFA) 108 (90 Base) MCG/ACT inhaler Inhale 2 puffs into the lungs every 6 (six) hours as needed for wheezing or shortness of breath.   04/07/2020 at Unknown time  . famotidine (PEPCID) 20 MG tablet Take 1 tablet (20 mg total) by mouth 2 (two) times daily. 60 tablet 5 Past Week at Unknown time  . Prenatal Vit-DSS-Fe Fum-FA (PRENATAL 19) tablet Take by mouth.   04/07/2020  at Unknown time  . valACYclovir (VALTREX) 500 MG tablet Take 1 tablet (500 mg total) by mouth 2 (two) times daily. 60 tablet 1 04/07/2020 at Unknown time  . albuterol (PROVENTIL) (2.5 MG/3ML) 0.083% nebulizer solution Take 3 mLs (2.5 mg total) by nebulization every 6 (six) hours as needed for wheezing or shortness of breath. (Patient not taking: Reported on 09/23/2019) 75 mL 0   . Blood Pressure Monitoring (BLOOD PRESSURE CUFF) MISC 1 Device by Does not apply route once a week. 1 each 0     I have reviewed patient's Past Medical Hx, Surgical Hx, Family Hx, Social Hx, medications and allergies.   ROS:  Review of Systems  Constitutional: Negative for fever.  Gastrointestinal: Positive for abdominal pain. Negative for constipation, diarrhea, nausea and vomiting.  Genitourinary: Negative for dysuria and  frequency.  Musculoskeletal: Negative for myalgias.   Other systems negative  Physical Exam   Patient Vitals for the past 24 hrs:  BP Temp Pulse Resp Height Weight  04/09/20 0003 127/83 -- 92 -- -- --  04/08/20 2341 134/80 -- -- -- -- --  04/08/20 2340 -- -- (!) 103 -- -- --  04/08/20 2335 -- 98.4 F (36.9 C) -- 18 5\' 7"  (1.702 m) 99.3 kg   Constitutional: Well-developed, well-nourished female in no acute distress.  Cardiovascular: normal rate and rhythm Respiratory: normal effort, clear to auscultation bilaterally GI: Abd soft, non-tender, gravid appropriate for gestational age.   No rebound or guarding. MS: Extremities nontender, no edema, normal ROM Neurologic: Alert and oriented x 4.  GU: Neg CVAT.  PELVIC EXAM:  0011hrs Dilation: 2 Effacement (%): 50 Cervical Position: Posterior Station: -3 Presentation: Vertex Exam by:: [email or web address] rNC No change after one hour (0135)  Dilation: 3.5 Effacement (%): 70 Cervical Position: Posterior Station: -3 Presentation: Vertex Exam by:: Ailsa Mireles CNM (0243hrs)  Dilation: 4 Effacement (%): 70, 60 Cervical Position: Middle Station: -3, -2 Presentation: Vertex Exam by:: Joy Reiger ([id number])  FHT:  Baseline 135 , moderate variability, accelerations present, no decelerations Contractions: q 4-5 mins Irregular    Labs: No results found for this or any previous visit (from the past 24 hour(s)).  A/Positive/-- (12/14 1436)  Imaging:  No results found.  MAU Course/MDM: NST reviewed and is very reactive, category I.  Patient feels movement now  Treatments in MAU included EFM  Cervix did change over 2+ hours but Fellow on Labor wants to watch and recheck a fourth time. .>> cervix did change more    Assessment: Single IUP at [redacted]w[redacted]d Uterine contractions with some change in cervix over time GBS Positive  Plan: Probable admission per decision of Labor team MD to follow  [redacted]w[redacted]d CNM, MSN Certified  Nurse-Midwife 04/09/2020 12:47 AM

## 2020-04-09 NOTE — Progress Notes (Signed)
Artelia Laroche CNM aware pt would like to be rechecked in an hour and agrees with POC

## 2020-04-10 NOTE — Clinical Social Work Maternal (Signed)
CLINICAL SOCIAL WORK MATERNAL/CHILD NOTE  Patient Details  Name: Amy Little MRN: [id number] Date of Birth: 03/10/1991  Date:  04/10/2020  Clinical Social Worker Initiating Note:  Alera Quevedo Date/Time: Initiated:  04/10/20/1001     Child's Name:  Jose Montoya Jr.   Biological Parents:  Mother, Father (Jose Montoya)   Need for Interpreter:  None   Reason for Referral:  Behavioral Health Concerns   Address:  [address] [place] [address]    Phone number:  [phone] (home)     Additional phone number:   Household Members/Support Persons (HM/SP):   Household Member/Support Person 1, Household Member/Support Person 2, Household Member/Support Person 3   HM/SP Name Relationship DOB or Age  HM/SP -1 Jose Montoya FOB    HM/SP -2 Victoria Montoya Daughter 11/30/2016  HM/SP -3 Cristian Lopez Son 11/08/2010  HM/SP -4        HM/SP -5        HM/SP -6        HM/SP -7        HM/SP -8          Natural Supports (not living in the home):  Extended Family   Professional Supports:     Employment: Unemployed   Type of Work:     Education:  Some College   Homebound arranged:    Financial Resources:  Medicaid   Other Resources:  Food Stamps , WIC   Cultural/Religious Considerations Which May Impact Care:    Strengths:  Ability to meet basic needs , Home prepared for child , Pediatrician chosen   Psychotropic Medications:         Pediatrician:    [place] (including [place])  Pediatrician List:   [place]    [place]    [place]    [place]    [place]    [place]  ([facility] [place])    Pediatrician Fax Number:    Risk Factors/Current Problems:  Mental Health Concerns    Cognitive State:  Able to Concentrate , Alert , Linear Thinking    Mood/Affect:  Calm , Interested    CSW Assessment:  CSW received consult for history of anxiety, depression and bipolar  disorder.  CSW met with MOB to offer support and complete assessment.    MOB sitting in bed tending to infant with FOB present at bedside, when CSW entered the room. CSW introduced self and received verbal permission from MOB to complete assessment with FOB present. CSW explained reason for consult to which MOB expressed understanding. CSW inquired about MOB's mental health history to which MOB acknowledged history of anxiety and depression as well as bipolar disorder but stated she never really explored last diagnosis further. Per MOB, she mostly feels anxiety and shared experiencing symptoms in the beginning of her pregnancy. MOB attributes symptoms to discontinuing Lexapro and Klonopin and reported symptoms eventually got better. MOB declined any current mental health concerns and denied interest in being restarted on medications or participating in therapy. MOB reported having good support from FOB and FOB's family. CSW provided education regarding the baby blues period vs. perinatal mood disorders, discussed treatment and gave resources for mental health follow up if concerns arise. CSW recommended self-evaluation during the postpartum time period using the New Mom Checklist from Postpartum Progress and encouraged MOB to contact a medical professional if symptoms are noted at any time. MOB denied any current SI or HI.   MOB confirmed having   all essential items for infant once discharged and stated infant would be sleeping in a bassinet once home. CSW provided review of Sudden Infant Death Syndrome (SIDS) precautions and safe sleeping habits.    CSW Plan/Description:  No Further Intervention Required/No Barriers to Discharge, Sudden Infant Death Syndrome (SIDS) Education, Perinatal Mood and Anxiety Disorder (PMADs) Education    Shania Bjelland, LCSW 04/10/2020, 11:32 AM 

## 2020-04-10 NOTE — Progress Notes (Signed)
Patient ID: Amy Little, female   DOB: August 09, 1991, 29 y.o.   MRN: [id number]  Post Partum Day 1 Subjective:  Amy Little is a 29 y.o. [id number] [redacted]w[redacted]d s/p SVD.  No acute events overnight.  Pt denies problems with ambulating, voiding or po intake.  She denies nausea or vomiting.  Pain is well controlled.   Lochia Minimal.  Plan for birth control is Unsure .  Method of Feeding:Formula- Good Start  Objective: Blood pressure (!) 105/52, pulse 89, temperature 98.6 F (37 C), resp. rate 18, height 5\' 7"  (1.702 m), weight 99.3 kg, last menstrual period 07/07/2019, SpO2 96 %, unknown if currently breastfeeding.  Physical Exam:  General: alert, cooperative and no distress Lochia:normal flow Chest: normal WOB Heart: Regular rate Abdomen: soft,  mild TTP (appropriate) Uterine Fundus: firm DVT Evaluation: No evidence of DVT seen on physical exam. Extremities: no edema  Recent Labs    04/09/20 0504  HGB 11.2*  HCT 33.5*    Assessment/Plan:  ASSESSMENT: Amy Little is a 29 y.o. 37 [redacted]w[redacted]d s/p SVD  Plan for discharge tomorrow and Circumcision prior to discharge- perform today SW consulted for history of anxiety/depression Continue routine PP care Breastfeeding support PRN   LOS: 1 day   [redacted]w[redacted]d 04/10/2020, 6:33 PM

## 2020-04-10 NOTE — Anesthesia Postprocedure Evaluation (Signed)
Anesthesia Post Note  Patient: [name]  Procedure(s) Performed: AN AD HOC LABOR EPIDURAL     Patient location during evaluation: Mother Baby Anesthesia Type: Epidural Level of consciousness: awake and alert and oriented Pain management: satisfactory to patient Vital Signs Assessment: post-procedure vital signs reviewed and stable Respiratory status: respiratory function stable Cardiovascular status: stable Postop Assessment: no headache, no backache, epidural receding, patient able to bend at knees, no signs of nausea or vomiting, adequate PO intake, no apparent nausea or vomiting and able to ambulate Anesthetic complications: no   No complications documented.  Last Vitals:  Vitals:   04/10/20 0206 04/10/20 0531  BP: 119/70 116/75  Pulse: 79 73  Resp:    Temp: 36.6 C 36.6 C  SpO2:      Last Pain:  Vitals:   04/10/20 0531  TempSrc: Oral  PainSc: 0-No pain   Pain Goal: Patients Stated Pain Goal: 0 (04/09/20 0538)                 Karleen Dolphin

## 2020-04-11 MED ORDER — POLYETHYLENE GLYCOL 3350 17 GM/SCOOP PO POWD
17.0000 g | Freq: Every day | ORAL | 1 refills | Status: AC | PRN
Start: 2020-04-11 — End: ?

## 2020-04-11 MED ORDER — IBUPROFEN 600 MG PO TABS: 600.0000 mg | ORAL_TABLET | Freq: Four times a day (QID) | ORAL | 0 refills | Status: AC

## 2020-04-11 NOTE — Discharge Instructions (Signed)

## 2020-04-14 ENCOUNTER — Emergency Department (HOSPITAL_BASED_OUTPATIENT_CLINIC_OR_DEPARTMENT_OTHER)
Admission: EM | Admit: 2020-04-14 | Discharge: 2020-04-14 | Disposition: A | Discharge status: Home / Self Care | Payer: Medicaid Other | Attending: Emergency Medicine | Admitting: Emergency Medicine

## 2020-04-14 ENCOUNTER — Other Ambulatory Visit: Payer: Self-pay

## 2020-04-14 ENCOUNTER — Encounter: Payer: Medicaid Other | Admitting: Obstetrics

## 2020-04-14 ENCOUNTER — Encounter (HOSPITAL_BASED_OUTPATIENT_CLINIC_OR_DEPARTMENT_OTHER): Payer: Self-pay

## 2020-04-14 DIAGNOSIS — J45909 Unspecified asthma, uncomplicated: Secondary | ICD-10-CM | POA: Insufficient documentation

## 2020-04-14 DIAGNOSIS — Z79899 Other long term (current) drug therapy: Secondary | ICD-10-CM | POA: Diagnosis not present

## 2020-04-14 DIAGNOSIS — F17200 Nicotine dependence, unspecified, uncomplicated: Secondary | ICD-10-CM | POA: Diagnosis not present

## 2020-04-14 DIAGNOSIS — M545 Low back pain, unspecified: Secondary | ICD-10-CM

## 2020-04-14 NOTE — ED Triage Notes (Signed)
Pt c/o pain to lower back that goes up spine to head started last night-denies injury-states she had vaginal delivery 7/1 with an epidural-NAD-slow gait

## 2020-04-14 NOTE — ED Notes (Signed)
Pt c/o Intermittent sharp pains that run from lower back to her neck. No pain currently. She had a vaginal delivery 7/1 with an epidural.

## 2020-04-14 NOTE — ED Provider Notes (Signed)
[facility] EMERGENCY DEPARTMENT Provider Note   CSN: [id number] Arrival date & time: 04/14/20  1444     History Chief Complaint  Patient presents with  . Back Pain    Lucilla Petrenko is a 29 y.o. female.  HPI      Arnitra Sokoloski is a 29 y.o. female, with a history of asthma, bipolar, presenting to the ED with back pain beginning last night. Patient states she underwent epidural associated with vaginal delivery on July 1.  She denies any complications afterward. Last night, she bent over from a standing position and felt pain from her lower back into her neck.  This pain was momentary. She had a momentary occurrence of this pain again today, again when she was bending over. She states she has had some dribbling incontinence and incontinence when her bladder is full since the delivery. She denies fever/chills, numbness, weakness, syncope, dizziness, persistent headache, positional headache, persistent back/neck pain, dysuria, difficulty urinating, N/V/D, or any other complaints.  Past Medical History:  Diagnosis Date  . Asthma   . Bipolar disorder (HCC)    "years ago, not sure if it's true"  . Depression   . Herpes genitalia    rare outbreaks  . HPV in female   . PID (pelvic inflammatory disease)   . Vaginal Pap smear, abnormal     Patient Active Problem List   Diagnosis Date Noted  . Normal labor 04/09/2020  . Positive GBS test 03/20/2020  . Supervision of other normal pregnancy, antepartum 08/30/2019  . GAD (generalized anxiety disorder) 06/24/2019  . MDD (major depressive disorder), recurrent episode, moderate (HCC) 12/20/2017  . Asthma 10/25/2016  . History of herpes labialis 10/25/2016  . Hx of abnormal cervical Pap smear 05/06/2016    Past Surgical History:  Procedure Laterality Date  . WISDOM TOOTH EXTRACTION       OB History    Gravida  4   Para  3   Term  3   Preterm      AB  1   Living  3     SAB  1   TAB      Ectopic       Multiple  0   Live Births  3           Family History  Problem Relation Age of Onset  . Thyroid disease Mother   . Depression Mother   . Anxiety disorder Mother   . ADD / ADHD Mother   . COPD Father   . Cancer Maternal Grandmother   . Cancer Maternal Grandfather   . COPD Paternal Grandmother     Social History   Tobacco Use  . Smoking status: Current Some Day Smoker    Packs/day: 0.50  . Smokeless tobacco: Never Used  Vaping Use  . Vaping Use: Never used  Substance Use Topics  . Alcohol use: Not Currently  . Drug use: No    Home Medications Prior to Admission medications   Medication Sig Start Date End Date Taking? Authorizing Provider  acetaminophen (TYLENOL) 500 MG tablet Take 500 mg by mouth every 6 (six) hours as needed for headache.     [provider]  albuterol (PROVENTIL HFA;VENTOLIN HFA) 108 (90 Base) MCG/ACT inhaler Inhale 2 puffs into the lungs every 6 (six) hours as needed for wheezing or shortness of breath.    [provider]  albuterol (PROVENTIL) (2.5 MG/3ML) 0.083% nebulizer solution Take 3 mLs (2.5 mg total) by nebulization every  6 (six) hours as needed for wheezing or shortness of breath. Patient not taking: Reported on 09/23/2019 08/04/18   Loren Racer, MD  famotidine (PEPCID) 20 MG tablet Take 1 tablet (20 mg total) by mouth 2 (two) times daily. 01/27/20   Leftwich-Kirby, Wilmer Floor, CNM  ibuprofen (ADVIL) 600 MG tablet Take 1 tablet (600 mg total) by mouth every 6 (six) hours. 04/11/20   Venora Maples, MD  polyethylene glycol powder (GLYCOLAX/MIRALAX) 17 GM/SCOOP powder Take 17 g by mouth daily as needed. 04/11/20   Venora Maples, MD  Prenatal Vit-DSS-Fe Fum-FA (PRENATAL 19) tablet Take 1 tablet by mouth daily.  08/13/19   [provider]    Allergies    Patient has no known allergies.  Review of Systems   Review of Systems  Constitutional: Negative for chills, diaphoresis and fever.  Respiratory: Negative for  shortness of breath.   Cardiovascular: Negative for chest pain.  Gastrointestinal: Negative for abdominal pain, constipation, diarrhea, nausea and vomiting.  Genitourinary: Negative for difficulty urinating and dysuria.  Musculoskeletal: Positive for back pain. Negative for neck pain and neck stiffness.  Neurological: Negative for dizziness, syncope, weakness, light-headedness, numbness and headaches.  All other systems reviewed and are negative.   Physical Exam Updated Vital Signs BP (!) 134/97 (BP Location: Left Arm)   Pulse 88   Temp 98.3 F (36.8 C) (Oral)   Resp 18   Ht 5\' 7"  (1.702 m)   Wt 92.1 kg   LMP 07/07/2019 (Exact Date)   SpO2 98%   BMI 31.79 kg/m   Physical Exam Vitals and nursing note reviewed.  Constitutional:      General: She is not in acute distress.    Appearance: She is well-developed. She is not diaphoretic.  HENT:     Head: Normocephalic and atraumatic.     Mouth/Throat:     Mouth: Mucous membranes are moist.     Pharynx: Oropharynx is clear.  Eyes:     Conjunctiva/sclera: Conjunctivae normal.  Cardiovascular:     Rate and Rhythm: Normal rate and regular rhythm.     Pulses: Normal pulses.          Radial pulses are 2+ on the right side and 2+ on the left side.       Posterior tibial pulses are 2+ on the right side and 2+ on the left side.     Heart sounds: Normal heart sounds.     Comments: Tactile temperature in the extremities appropriate and equal bilaterally. Pulmonary:     Effort: Pulmonary effort is normal. No respiratory distress.     Breath sounds: Normal breath sounds.  Abdominal:     Palpations: Abdomen is soft.     Tenderness: There is no abdominal tenderness. There is no guarding.  Musculoskeletal:     Cervical back: Neck supple.     Right lower leg: No edema.     Left lower leg: No edema.     Comments: The patient's entire back was examined and palpated.  No tenderness, swelling, wounds, color abnormality, or any other  abnormalities were noted. She can flex and extend the spine during my exam without recurrence of her pain.  Lymphadenopathy:     Cervical: No cervical adenopathy.  Skin:    General: Skin is warm and dry.  Neurological:     Mental Status: She is alert.     Deep Tendon Reflexes:     Reflex Scores:      Patellar reflexes are  2+ on the right side and 2+ on the left side.    Comments: No noted acute cognitive deficit. Sensation grossly intact to light touch in the extremities.   Grip strengths equal bilaterally.   Strength 5/5 in all extremities.  No gait disturbance.  Coordination intact.  Cranial nerves III-XII grossly intact.  Handles oral secretions without noted difficulty.  No noted phonation or speech deficit. No facial droop.   I also have the patient change positions multiple times.  She had no onset of back pain, neck pain, headache, or really any other symptoms.  Psychiatric:        Mood and Affect: Mood and affect normal.        Speech: Speech normal.        Behavior: Behavior normal.     ED Results / Procedures / Treatments   Labs (all labs ordered are listed, but only abnormal results are displayed) Labs Reviewed - No data to display  EKG None  Radiology No results found.  Procedures Procedures (including critical care time)  Medications Ordered in ED Medications - No data to display  ED Course  I have reviewed the triage vital signs and the nursing notes.  Pertinent labs & imaging results that were available during my care of the patient were reviewed by me and considered in my medical decision making (see chart for details).  Clinical Course as of Apr 14 1737  Tue Apr 14, 2020  1709 Spoke with Dr. Vergie Living, OBGYN.  Assure she doesn't have spinal headache, pyelo, abdominal tenderness. Call clinic tomorrow.   [SJ]  1718 Went to the patient's room to relay advice given by OB/GYN.  She is dressed and waiting in the hallway. Does not want to do anything  further.   [SJ]    Clinical Course User Index [SJ] Josely Moffat, Hillard Danker, PA-C   MDM Rules/Calculators/A&P                          Patient presents with momentary back pain. Patient is nontoxic appearing, afebrile, not tachycardic, not tachypneic, not hypotensive, excellent SPO2 on room air, and is in no apparent distress.   I have reviewed the patient's chart to obtain more information.   There was recommendation by OB/GYN for further work-up, specifically UA.  This was requested of the patient with explanation.  Patient stated she needed to get home to her baby. She will follow-up with OB/GYN in the clinic. Strict return precautions discussed.  Patient voices understanding of these instructions, accepts the plan, and is comfortable with discharge.  Findings and plan of care discussed with Vanetta Mulders, MD.   Final Clinical Impression(s) / ED Diagnoses Final diagnoses:  Acute midline low back pain without sciatica    Rx / DC Orders ED Discharge Orders    None       Concepcion Living 04/16/20 0528    Vanetta Mulders, MD 04/18/20 1515

## 2020-04-14 NOTE — Discharge Instructions (Signed)
Follow-up with OB/GYN.  Call the clinic tomorrow to set up an appointment. Return to the emergency department for numbness, weakness, passing out, abdominal pain, pain with urination, difficulty urinating, or any other major concerns.

## 2020-04-17 ENCOUNTER — Other Ambulatory Visit (HOSPITAL_COMMUNITY): Payer: Medicaid Other

## 2020-04-19 ENCOUNTER — Inpatient Hospital Stay (HOSPITAL_COMMUNITY): Payer: Medicaid Other

## 2020-04-19 ENCOUNTER — Inpatient Hospital Stay (HOSPITAL_COMMUNITY): Admission: RE | Admit: 2020-04-19 | Payer: Medicaid Other | Source: Home / Self Care | Admitting: Obstetrics

## 2020-05-08 ENCOUNTER — Ambulatory Visit: Payer: Medicaid Other | Admitting: Obstetrics & Gynecology

## 2020-05-12 ENCOUNTER — Ambulatory Visit: Payer: Medicaid Other | Admitting: Obstetrics and Gynecology

## 2020-05-21 ENCOUNTER — Other Ambulatory Visit: Payer: Self-pay | Admitting: Obstetrics and Gynecology

## 2020-05-21 ENCOUNTER — Ambulatory Visit (INDEPENDENT_AMBULATORY_CARE_PROVIDER_SITE_OTHER): Payer: Medicaid Other | Admitting: Obstetrics and Gynecology

## 2020-05-21 ENCOUNTER — Encounter: Payer: Self-pay | Admitting: Obstetrics and Gynecology

## 2020-05-21 ENCOUNTER — Other Ambulatory Visit: Payer: Self-pay

## 2020-05-21 DIAGNOSIS — Z01812 Encounter for preprocedural laboratory examination: Secondary | ICD-10-CM | POA: Diagnosis not present

## 2020-05-21 DIAGNOSIS — Z30430 Encounter for insertion of intrauterine contraceptive device: Secondary | ICD-10-CM | POA: Diagnosis not present

## 2020-05-21 LAB — POCT URINE PREGNANCY: Preg Test, Ur: NEGATIVE

## 2020-05-21 MED ORDER — PARAGARD INTRAUTERINE COPPER IU IUD: INTRAUTERINE_SYSTEM | Freq: Once | INTRAUTERINE | Status: AC

## 2020-05-21 NOTE — Progress Notes (Signed)
Post Partum Visit Note  Amy Little is a 29 y.o. [phone] female who presents for a postpartum visit. She is 6 weeks postpartum following a normal spontaneous vaginal delivery.  I have fully reviewed the prenatal and intrapartum course. The delivery was at [redacted]w[redacted]d gestational weeks.  Anesthesia: epidural. Postpartum course has been going okay. First few weeks were really tough. Baby is doing well. Baby is feeding by bottle - [employer] Good Start. Bleeding pt reports she stopped bleeding and then period returned 3 days ago. Bowel function is normal. Bladder function is normal. Patient is sexually active. Contraception method is IUD. Postpartum depression screening: negative.     The pregnancy intention screening data noted above was reviewed. Potential methods of contraception were discussed. The patient elected to proceed with IUD placed today. See separate procedure note for details.    Edinburgh Postnatal Depression Scale - 05/21/20 1359      Edinburgh Postnatal Depression Scale:  In the Past 7 Days   I have been able to laugh and see the funny side of things. 0    I have looked forward with enjoyment to things. 0    I have blamed myself unnecessarily when things went wrong. 0    I have been anxious or worried for no good reason. 0    I have felt scared or panicky for no good reason. 0    Things have been getting on top of me. 1    I have been so unhappy that I have had difficulty sleeping. 0    I have felt sad or miserable. 1    I have been so unhappy that I have been crying. 0    The thought of harming myself has occurred to me. 0    Edinburgh Postnatal Depression Scale Total 2            The following portions of the patient's history were reviewed and updated as appropriate: allergies, current medications, past family history, past medical history, past social history, past surgical history and problem list.  Review of Systems Pertinent items are noted in HPI.     Objective:  Blood pressure 115/81, pulse 81, weight 193 lb (87.5 kg), last menstrual period 05/18/2020, not currently breastfeeding.  General:  alert and appears stated age   Breasts:  inspection negative, no nipple discharge or bleeding, no masses or nodularity palpable  Lungs: clear to auscultation bilaterally  Heart:  regular rate and rhythm, S1, S2 normal, no murmur, click, rub or gallop  Abdomen: soft, non-tender; bowel sounds normal; no masses,  no organomegaly   Vulva:  normal  Vagina: normal vagina  Cervix:  multiparous appearance Os is visually dilated, scant bleeding noted from os (pt currently menstruating)   Corpus: normal  Adnexa:  normal adnexa  Rectal Exam: Not performed.        Assessment:    Normal postpartum exam. Pap smear not done at today's visit.   Plan:   Essential components of care per ACOG recommendations:  1.  Mood and well being: Patient with negative depression screening today. Reviewed local resources for support.  - Patient does not use tobacco.  - hx of drug use? No     2. Infant care and feeding:  -Patient currently breastmilk feeding? No   -Social determinants of health (SDOH) reviewed in EPIC. No concerns    3. Sexuality, contraception and birth spacing - Patient does not want a pregnancy in the next year.  Does not desire  any further pregnancies.  - Paragard IUD placed today.  - Discussed birth spacing of 18 months  4. Sleep and fatigue -Encouraged family/partner/community support of 4 hrs of uninterrupted sleep to help with mood and fatigue  5. Physical Recovery  - Discussed patients delivery and complications - Patient had a 2nd degree laceration, perineal healing reviewed. Patient expressed understanding - Patient has urinary incontinence? No    - Patient is not safe to resume physical and sexual activity  6.  Health Maintenance - Last pap smear done 09/23/2019 and was normal with negative HPV.   Gita Kudo, MD Center  for [facility], [facility]

## 2020-05-21 NOTE — Progress Notes (Signed)
° ° °  GYNECOLOGY CLINIC PROCEDURE NOTE  [name] is a 29 y.o. [phone] here for Paragard IUD insertion. No GYN concerns.  Last pap smear was on 09/23/19 and was normal.  IUD Insertion Procedure Note Patient identified, informed consent performed.  Discussed risks of irregular bleeding, cramping, infection, malpositioning or misplacement of the IUD outside the uterus which may require further procedure such as laparoscopy. Time out was performed.  Urine pregnancy test negative.  Speculum placed in the vagina.  Cervix visualized.  Cleaned with Betadine x 2.  Tenaculum not needed due to multiparous cervix. Uterus sounded to 8 cm.  Paragard IUD placed per manufacturer's recommendations.  Strings trimmed to 3 cm. Tenaculum was removed, good hemostasis noted.  Patient tolerated procedure well.   Patient was given post-procedure instructions.  She was advised to be have backup contraception for one week.  Patient was also asked to check IUD strings periodically and follow up in 4 weeks for IUD check.  Gita Kudo, MD 05/21/2020 2:30 PM

## 2020-06-22 ENCOUNTER — Ambulatory Visit: Payer: Medicaid Other | Admitting: Nurse Practitioner

## 2020-06-22 DIAGNOSIS — Z975 Presence of (intrauterine) contraceptive device: Secondary | ICD-10-CM | POA: Insufficient documentation

## 2020-08-03 ENCOUNTER — Other Ambulatory Visit (HOSPITAL_COMMUNITY)
Admission: RE | Admit: 2020-08-03 | Discharge: 2020-08-03 | Disposition: A | Discharge status: Home / Self Care | Payer: Medicaid Other | Source: Ambulatory Visit | Attending: Obstetrics and Gynecology | Admitting: Obstetrics and Gynecology

## 2020-08-03 ENCOUNTER — Encounter: Payer: Self-pay | Admitting: Obstetrics and Gynecology

## 2020-08-03 ENCOUNTER — Other Ambulatory Visit: Payer: Self-pay

## 2020-08-03 ENCOUNTER — Ambulatory Visit: Payer: Medicaid Other | Admitting: Obstetrics and Gynecology

## 2020-08-03 VITALS — BP 121/80 | HR 90 | Wt 194.7 lb

## 2020-08-03 DIAGNOSIS — Z30431 Encounter for routine checking of intrauterine contraceptive device: Secondary | ICD-10-CM | POA: Diagnosis not present

## 2020-08-03 DIAGNOSIS — Z8619 Personal history of other infectious and parasitic diseases: Secondary | ICD-10-CM

## 2020-08-03 DIAGNOSIS — Z113 Encounter for screening for infections with a predominantly sexual mode of transmission: Secondary | ICD-10-CM

## 2020-08-03 MED ORDER — VALACYCLOVIR HCL 1 G PO TABS
1000.0000 mg | ORAL_TABLET | Freq: Every day | ORAL | 2 refills | Status: AC
Start: 2020-08-03 — End: ?

## 2020-08-03 NOTE — Progress Notes (Signed)
Pt presents for IUD check  Reports husband can feel the device  Paragard inserted 05/21/2020  Requests rf on Valtrex  Normal pap 09/23/2019  Requests vaginal STD testing only

## 2020-08-03 NOTE — Progress Notes (Signed)
GYNECOLOGY OFFICE VISIT NOTE  History:  29 y.o. [id number] here today for concerns for IUD shift. She has occasional cramping in between periods and state her husband felt something. She is worried IUD is not in correct position. Also requesting Valtrex refill and STI testing.   Past Medical History:  Diagnosis Date  . Asthma   . Bipolar disorder (HCC)    "years ago, not sure if it's true"  . Depression   . Herpes genitalia    rare outbreaks  . HPV in female   . PID (pelvic inflammatory disease)   . Vaginal Pap smear, abnormal     Past Surgical History:  Procedure Laterality Date  . WISDOM TOOTH EXTRACTION       Current Outpatient Medications:  .  albuterol (PROVENTIL HFA;VENTOLIN HFA) 108 (90 Base) MCG/ACT inhaler, Inhale 2 puffs into the lungs every 6 (six) hours as needed for wheezing or shortness of breath. , Disp: , Rfl:  .  albuterol (PROVENTIL) (2.5 MG/3ML) 0.083% nebulizer solution, Take 3 mLs (2.5 mg total) by nebulization every 6 (six) hours as needed for wheezing or shortness of breath., Disp: 75 mL, Rfl: 0 .  acetaminophen (TYLENOL) 500 MG tablet, Take 500 mg by mouth every 6 (six) hours as needed for headache.  (Patient not taking: Reported on 05/21/2020), Disp: , Rfl:  .  famotidine (PEPCID) 20 MG tablet, Take 1 tablet (20 mg total) by mouth 2 (two) times daily. (Patient not taking: Reported on 05/21/2020), Disp: 60 tablet, Rfl: 5 .  ibuprofen (ADVIL) 600 MG tablet, Take 1 tablet (600 mg total) by mouth every 6 (six) hours. (Patient not taking: Reported on 08/03/2020), Disp: 30 tablet, Rfl: 0 .  polyethylene glycol powder (GLYCOLAX/MIRALAX) 17 GM/SCOOP powder, Take 17 g by mouth daily as needed. (Patient not taking: Reported on 05/21/2020), Disp: 510 g, Rfl: 1 .  Prenatal Vit-DSS-Fe Fum-FA (PRENATAL 19) tablet, Take 1 tablet by mouth daily.  (Patient not taking: Reported on 05/21/2020), Disp: , Rfl:  .  valACYclovir (VALTREX) 1000 MG tablet, Take 1 tablet (1,000 mg total)  by mouth daily. Take for 5 days, Disp: 5 tablet, Rfl: 2  The following portions of the patient's history were reviewed and updated as appropriate: allergies, current medications, past family history, past medical history, past social history, past surgical history and problem list.   Review of Systems:  Pertinent items noted in HPI and remainder of comprehensive ROS otherwise negative.   Objective:  Physical Exam BP 121/80   Pulse 90   Wt 194 lb 11.2 oz (88.3 kg)   LMP 07/27/2020 (Approximate)   BMI 30.49 kg/m  CONSTITUTIONAL: Well-developed, well-nourished female in no acute distress.  HENT:  Normocephalic, atraumatic. External right and left ear normal. Oropharynx is clear and moist EYES: Conjunctivae and EOM are normal. Pupils are equal, round, and reactive to light. No scleral icterus.  NECK: Normal range of motion, supple, no masses SKIN: Skin is warm and dry. No rash noted. Not diaphoretic. No erythema. No pallor. NEUROLOGIC: Alert and oriented to person, place, and time. Normal reflexes, muscle tone coordination. No cranial nerve deficit noted. PSYCHIATRIC: Normal mood and affect. Normal behavior. Normal judgment and thought content. CARDIOVASCULAR: Normal heart rate noted RESPIRATORY: Effort normal, no problems with respiration noted ABDOMEN: Soft, no distention noted.   PELVIC: Normal appearing external genitalia; normal appearing vaginal mucosa and cervix.  No abnormal discharge noted.  white IUD strings seen in place protruding from os MUSCULOSKELETAL: Normal range of motion. No  edema noted.  Exam done with chaperone present.  Labs and Imaging No results found.  Assessment & Plan:  1. IUD check up IUd appearing to be in place If continues to cause pain/issues, will order [place]  2. Routine screening for STI (sexually transmitted infection) Declines HIV/RPR - Cervicovaginal ancillary only( [place])  3. History of herpes labialis Rx refill send   Routine  preventative health maintenance measures emphasized. Please refer to After Visit Summary for other counseling recommendations.   Return if symptoms worsen or fail to improve.   Total face-to-face time with patient: 25 minutes. Over 50% of encounter was spent on counseling and coordination of care.   Baldemar Lenis, M.D. Attending [facility] [employer] [facility])

## 2020-08-04 LAB — CERVICOVAGINAL ANCILLARY ONLY
Chlamydia: NEGATIVE
Comment: NEGATIVE
Comment: NEGATIVE
Comment: NORMAL
Neisseria Gonorrhea: NEGATIVE
Trichomonas: NEGATIVE

## 2021-05-12 ENCOUNTER — Other Ambulatory Visit: Payer: Self-pay

## 2021-05-12 ENCOUNTER — Emergency Department (HOSPITAL_BASED_OUTPATIENT_CLINIC_OR_DEPARTMENT_OTHER)
Admission: EM | Admit: 2021-05-12 | Discharge: 2021-05-12 | Disposition: A | Discharge status: Home / Self Care | Payer: Medicaid Other | Attending: Emergency Medicine | Admitting: Emergency Medicine

## 2021-05-12 ENCOUNTER — Encounter (HOSPITAL_BASED_OUTPATIENT_CLINIC_OR_DEPARTMENT_OTHER): Payer: Self-pay | Admitting: Emergency Medicine

## 2021-05-12 DIAGNOSIS — H6091 Unspecified otitis externa, right ear: Secondary | ICD-10-CM | POA: Diagnosis not present

## 2021-05-12 DIAGNOSIS — R509 Fever, unspecified: Secondary | ICD-10-CM | POA: Diagnosis present

## 2021-05-12 DIAGNOSIS — U071 COVID-19: Secondary | ICD-10-CM | POA: Insufficient documentation

## 2021-05-12 DIAGNOSIS — F17210 Nicotine dependence, cigarettes, uncomplicated: Secondary | ICD-10-CM | POA: Insufficient documentation

## 2021-05-12 DIAGNOSIS — J45909 Unspecified asthma, uncomplicated: Secondary | ICD-10-CM | POA: Insufficient documentation

## 2021-05-12 DIAGNOSIS — H6691 Otitis media, unspecified, right ear: Secondary | ICD-10-CM | POA: Diagnosis not present

## 2021-05-12 DIAGNOSIS — H66001 Acute suppurative otitis media without spontaneous rupture of ear drum, right ear: Secondary | ICD-10-CM

## 2021-05-12 DIAGNOSIS — H60501 Unspecified acute noninfective otitis externa, right ear: Secondary | ICD-10-CM

## 2021-05-12 MED ORDER — AMOXICILLIN-POT CLAVULANATE 500-125 MG PO TABS: 1.0000 | ORAL_TABLET | Freq: Three times a day (TID) | ORAL | 0 refills | Status: AC

## 2021-05-12 MED ORDER — ACETAMINOPHEN 500 MG PO TABS
1000.0000 mg | ORAL_TABLET | Freq: Once | ORAL | Status: AC
  Administered 2021-05-12: 1000 mg via ORAL
  Filled 2021-05-12: qty 2

## 2021-05-12 MED ORDER — HYDROCODONE-ACETAMINOPHEN 5-325 MG PO TABS: 1.0000 | ORAL_TABLET | Freq: Four times a day (QID) | ORAL | 0 refills | Status: AC | PRN

## 2021-05-12 MED ORDER — NEOMYCIN-POLYMYXIN-HC 3.5-10000-1 OT SUSP
4.0000 [drp] | Freq: Four times a day (QID) | OTIC | 0 refills | Status: AC
Start: 2021-05-12 — End: ?

## 2021-05-12 NOTE — ED Provider Notes (Addendum)
[facility] EMERGENCY DEPARTMENT Provider Note   CSN: [id number] Arrival date & time: 05/12/21  0447     History Chief Complaint  Patient presents with   Otalgia    Amy Little is a 30 y.o. female.  Patient is a 30 year old female with history of asthma.  Patient presenting today for evaluation of right ear pain.  She tells me she tested positive yesterday for COVID-19.  In the night, she developed severe right ear pain.  There was no injury or trauma.  She does report low-grade fevers at home.  She denies any hearing loss.  The history is provided by the patient.  Otalgia Location:  Right Quality:  Throbbing Severity:  Moderate Onset quality:  Sudden Duration:  12 hours Timing:  Constant Progression:  Worsening     Past Medical History:  Diagnosis Date   Asthma    Bipolar disorder (HCC)    "years ago, not sure if it's true"   Depression    Herpes genitalia    rare outbreaks   HPV in female    PID (pelvic inflammatory disease)    Vaginal Pap smear, abnormal     Patient Active Problem List   Diagnosis Date Noted   IUD (intrauterine device) in place 06/22/2020   GAD (generalized anxiety disorder) 06/24/2019   MDD (major depressive disorder), recurrent episode, moderate (HCC) 12/20/2017   Asthma 10/25/2016   History of herpes labialis 10/25/2016   Hx of abnormal cervical Pap smear 05/06/2016    Past Surgical History:  Procedure Laterality Date   WISDOM TOOTH EXTRACTION       OB History     Gravida  4   Para  3   Term  3   Preterm      AB  1   Living  3      SAB  1   IAB      Ectopic      Multiple  0   Live Births  3           Family History  Problem Relation Age of Onset   Thyroid disease Mother    Depression Mother    Anxiety disorder Mother    ADD / ADHD Mother    COPD Father    Cancer Maternal Grandmother    Cancer Maternal Grandfather    COPD Paternal Grandmother     Social History   Tobacco Use    Smoking status: Some Days    Packs/day: 0.50    Types: Cigarettes   Smokeless tobacco: Never  Vaping Use   Vaping Use: Never used  Substance Use Topics   Alcohol use: Not Currently   Drug use: Yes    Types: Marijuana    Home Medications Prior to Admission medications   Medication Sig Start Date End Date Taking? Authorizing Provider  acetaminophen (TYLENOL) 500 MG tablet Take 500 mg by mouth every 6 (six) hours as needed for headache.  Patient not taking: Reported on 05/21/2020    [provider]  albuterol (PROVENTIL HFA;VENTOLIN HFA) 108 (90 Base) MCG/ACT inhaler Inhale 2 puffs into the lungs every 6 (six) hours as needed for wheezing or shortness of breath.     [provider]  albuterol (PROVENTIL) (2.5 MG/3ML) 0.083% nebulizer solution Take 3 mLs (2.5 mg total) by nebulization every 6 (six) hours as needed for wheezing or shortness of breath. 08/04/18   Loren Racer, MD  famotidine (PEPCID) 20 MG tablet Take 1 tablet (20 mg  total) by mouth 2 (two) times daily. Patient not taking: Reported on 05/21/2020 01/27/20   Sharen Counter A, CNM  ibuprofen (ADVIL) 600 MG tablet Take 1 tablet (600 mg total) by mouth every 6 (six) hours. Patient not taking: Reported on 08/03/2020 04/11/20   Venora Maples, MD  polyethylene glycol powder [facility]) 17 GM/SCOOP powder Take 17 g by mouth daily as needed. Patient not taking: Reported on 05/21/2020 04/11/20   Venora Maples, MD  Prenatal Vit-DSS-Fe Fum-FA (PRENATAL 19) tablet Take 1 tablet by mouth daily.  Patient not taking: Reported on 05/21/2020 08/13/19   [provider]  valACYclovir (VALTREX) 1000 MG tablet Take 1 tablet (1,000 mg total) by mouth daily. Take for 5 days 08/03/20   Conan Bowens, MD    Allergies    Patient has no known allergies.  Review of Systems   Review of Systems  HENT:  Positive for ear pain.   All other systems reviewed and are negative.  Physical Exam Updated Vital  Signs BP 138/76   Pulse 98   Temp 99 F (37.2 C) (Oral)   Resp 18   Ht 5\' 7"  (1.702 m)   Wt 88.5 kg   SpO2 98%   BMI 30.54 kg/m   Physical Exam Vitals and nursing note reviewed.  Constitutional:      General: She is not in acute distress.    Appearance: She is well-developed. She is not diaphoretic.  HENT:     Head: Normocephalic and atraumatic.     Left Ear: Tympanic membrane and ear canal normal.     Ears:     Comments: The right ear canal is erythematous.  There is pain with insertion of the speculum and manipulation of the external ear.  The TM itself is also erythematous and bulging. Cardiovascular:     Rate and Rhythm: Normal rate and regular rhythm.     Heart sounds: No murmur heard.   No friction rub. No gallop.  Pulmonary:     Effort: Pulmonary effort is normal. No respiratory distress.     Breath sounds: Normal breath sounds. No wheezing.  Abdominal:     General: Bowel sounds are normal. There is no distension.     Palpations: Abdomen is soft.     Tenderness: There is no abdominal tenderness.  Musculoskeletal:        General: Normal range of motion.     Cervical back: Normal range of motion and neck supple.  Skin:    General: Skin is warm and dry.  Neurological:     General: No focal deficit present.     Mental Status: She is alert and oriented to person, place, and time.    ED Results / Procedures / Treatments   Labs (all labs ordered are listed, but only abnormal results are displayed) Labs Reviewed - No data to display  EKG None  Radiology No results found.  Procedures Procedures   Medications Ordered in ED Medications - No data to display  ED Course  I have reviewed the triage vital signs and the nursing notes.  Pertinent labs & imaging results that were available during my care of the patient were reviewed by me and considered in my medical decision making (see chart for details).    MDM Rules/Calculators/A&P  Patient with recent  diagnosis of COVID-19, now with what appears to be an otitis externa/otitis media.  She will be treated with Cortisporin drops and Augmentin.  She will also be given  medicine for pain.  I attempted to access the [facility], however received an error code.  Final Clinical Impression(s) / ED Diagnoses Final diagnoses:  None    Rx / DC Orders ED Discharge Orders     None        Geoffery Lyons, MD 05/12/21 1287    Geoffery Lyons, MD 05/12/21 [phone]

## 2021-05-12 NOTE — Discharge Instructions (Addendum)
Begin taking Augmentin as prescribed and hydrocodone as prescribed as needed for pain..  Begin using Cortisporin drops as prescribed.  Follow-up with primary doctor in the next week for a recheck, and return to the ER symptoms significantly worsen or change.

## 2021-05-12 NOTE — ED Triage Notes (Signed)
Pt tested positive for COVID yesterday, reports feeling fine until tonight, now reports right ear pain and neck pain

## 2021-06-25 IMAGING — US US OB COMP LESS 14 WK
1 series · 14 of 28 positions shown · non-contrast
Comparison: None.

CLINICAL DATA: Vaginal bleeding in early pregnancy.

EXAM:
OBSTETRIC <14 WK ULTRASOUND
TECHNIQUE: Transabdominal ultrasound was performed for evaluation of the
gestation as well as the maternal uterus and adnexal regions.

[Series 1: us ob comp less 14 wk · 14 of 35 slices shown]
[im 2/35]
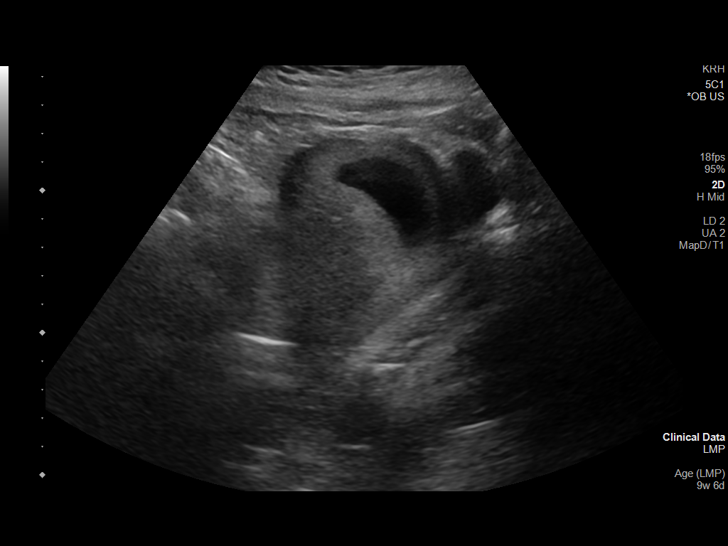
[im 4/35]
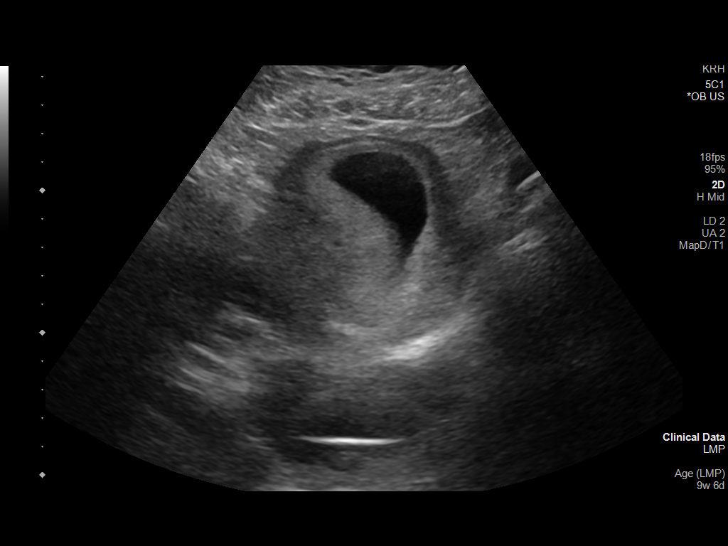
[im 7/35]
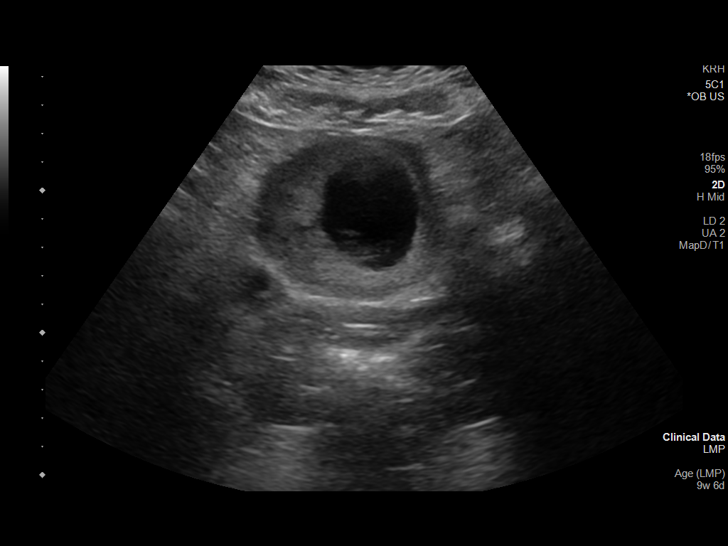
[im 9/35]
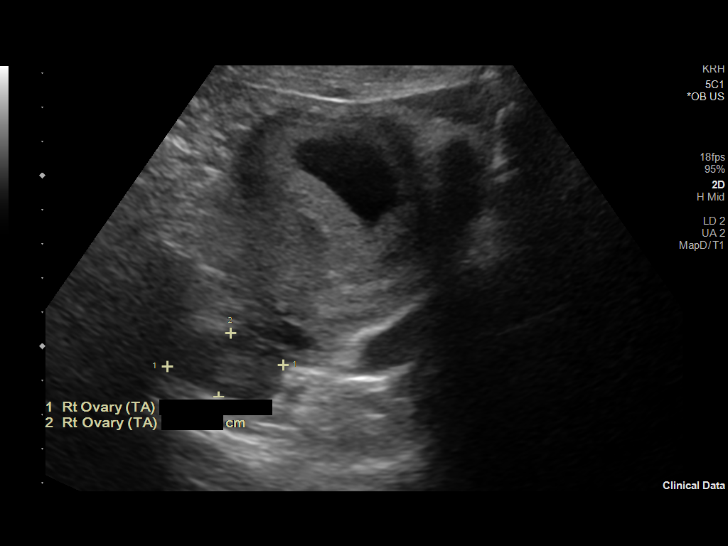
[im 12/35]
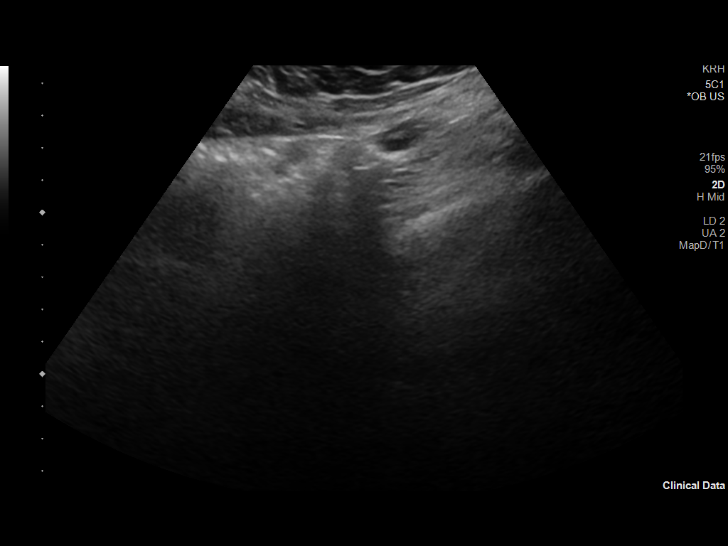
[im 14/35]
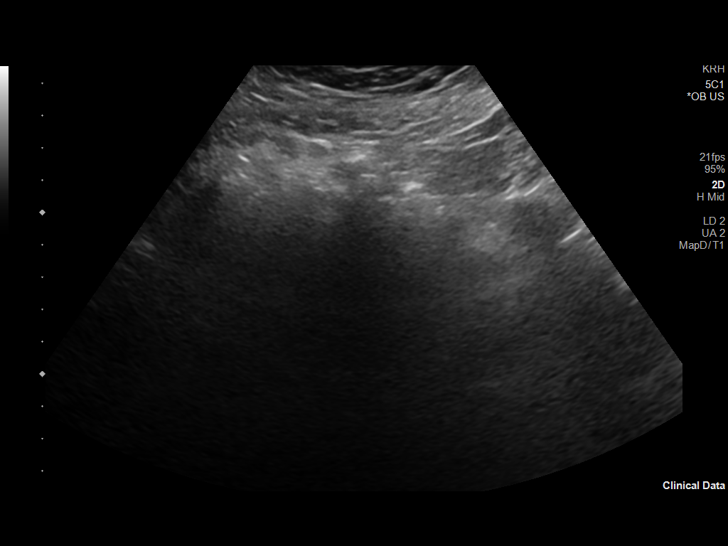
[im 17/35]
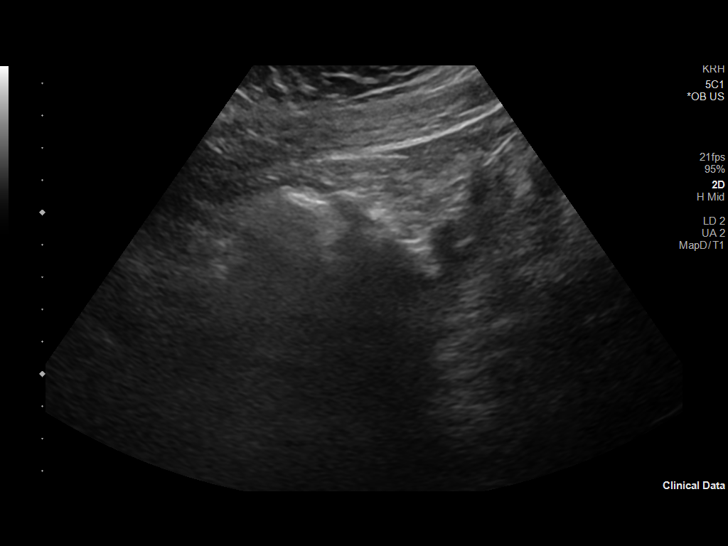
[im 19/35]
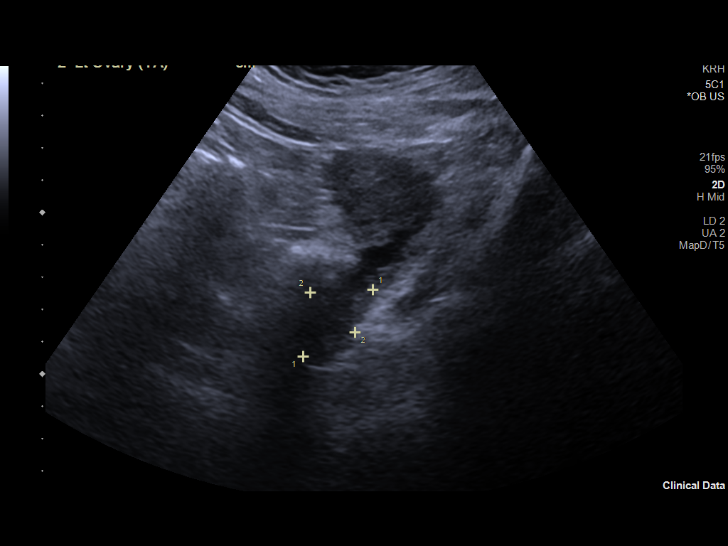
[im 22/35]
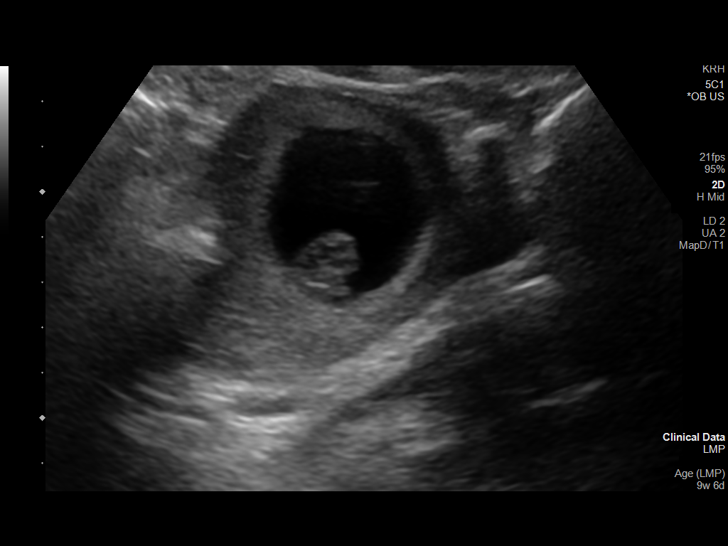
[im 24/35]
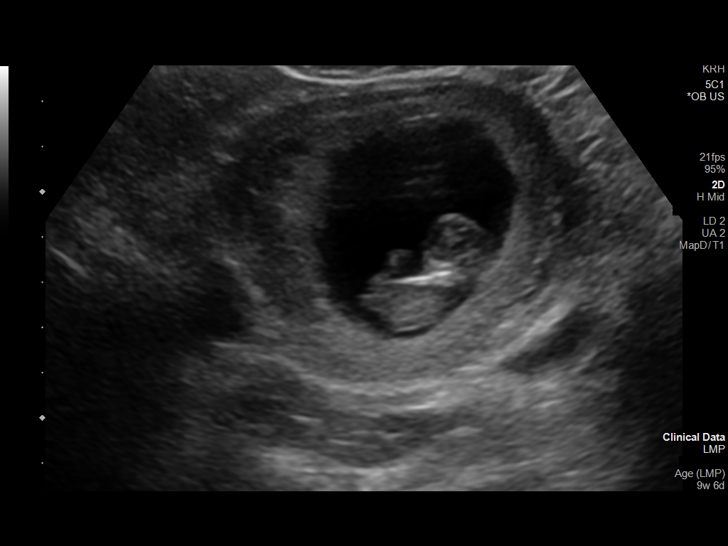
[im 27/35]
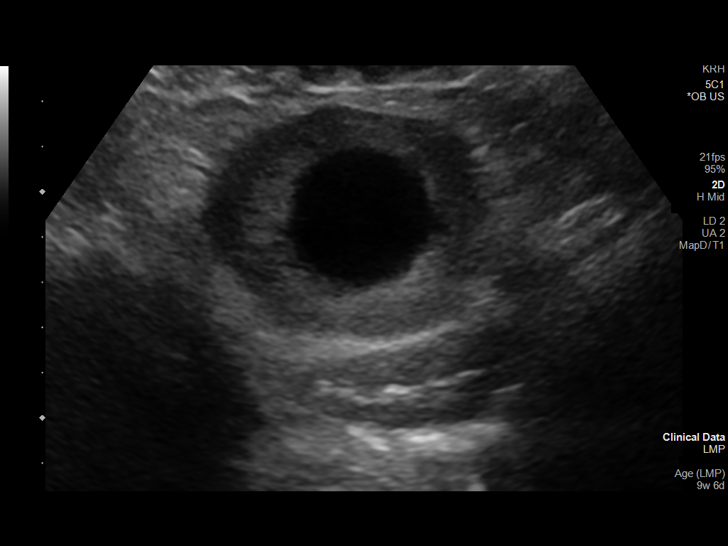
[im 29/35]
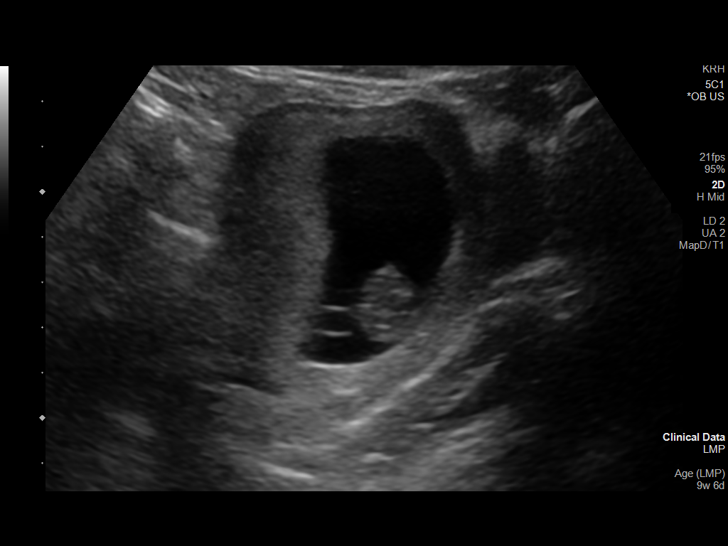
[im 32/35]
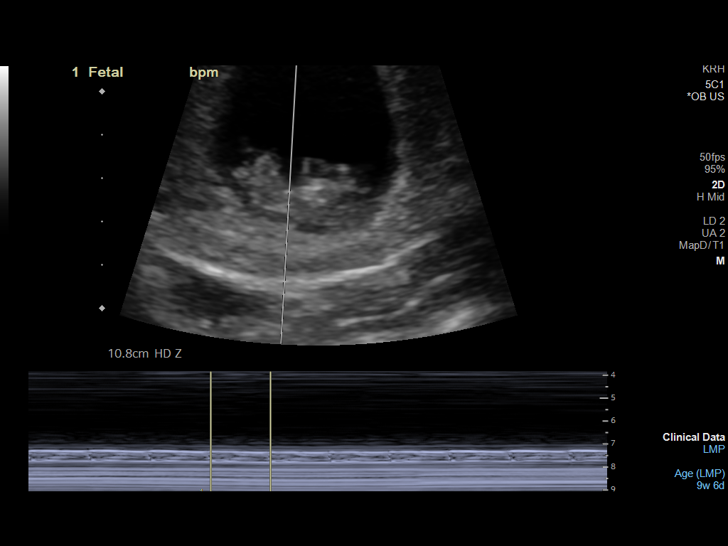
[im 35/35]
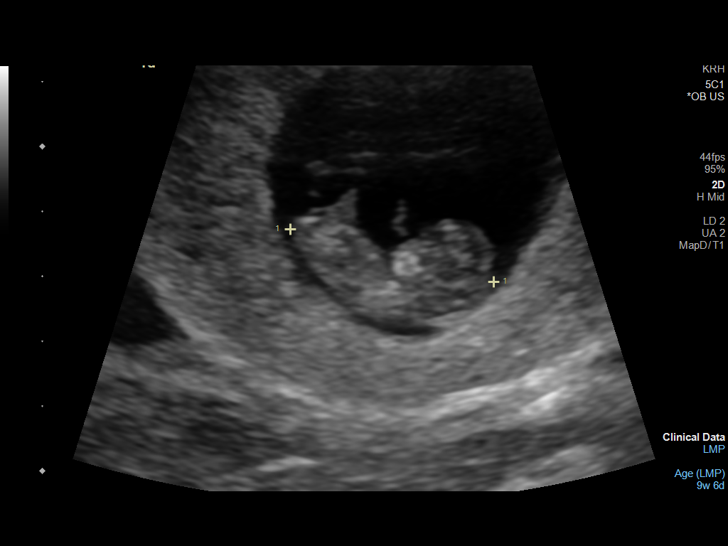

[14 of 28 positions shown; findings below may reference images not displayed]

FINDINGS: Intrauterine gestational sac: Single

Yolk sac:  Yes

Embryo:  Yes

Cardiac Activity: Yes

Heart Rate: 171 bpm

CRL:   32.4 mm   10 w 1 d                  US EDC: 04/11/2020

Subchorionic hemorrhage:  None visualized.

Maternal uterus/adnexae:

Subchorionic hemorrhage: None

Right ovary: Normal

Left ovary: Normal

Other :None

Free fluid:  None
IMPRESSION: 1. Single living intrauterine gestation with an estimated
gestational age of 10 weeks and 1 day.

## 2021-08-21 IMAGING — US US MFM OB DETAIL+14 WK
1 series · 13 of 28 positions shown · non-contrast
Comparison: none

[Series 1: us mfm ob detail+14 wk · 13 of 72 slices shown]
[im 3/72]
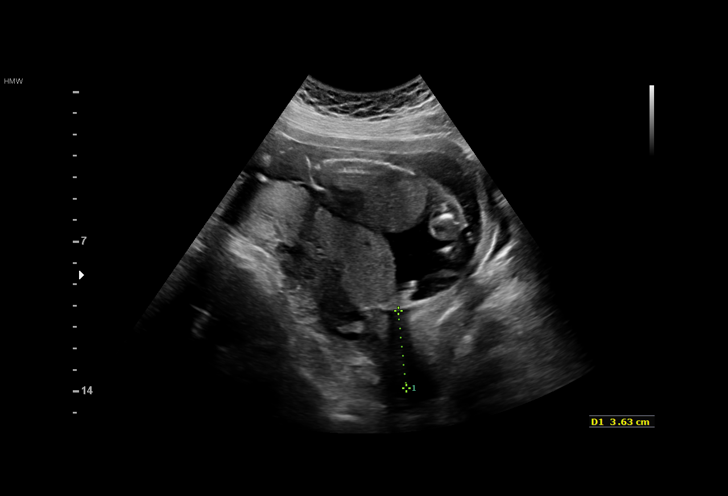
[im 8/72]
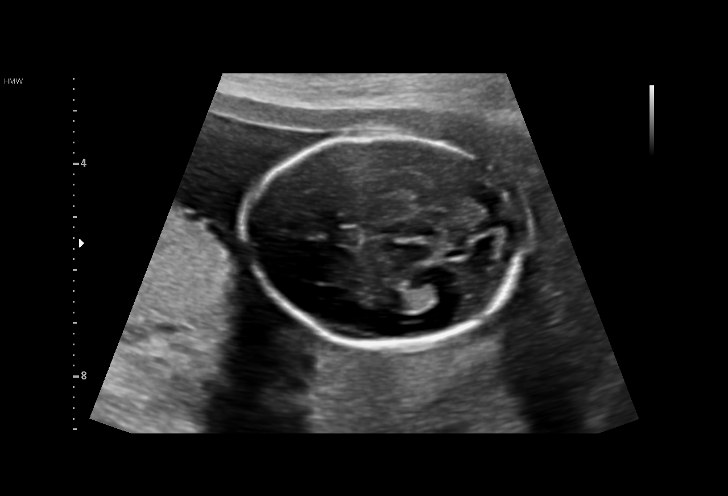
[im 14/72]
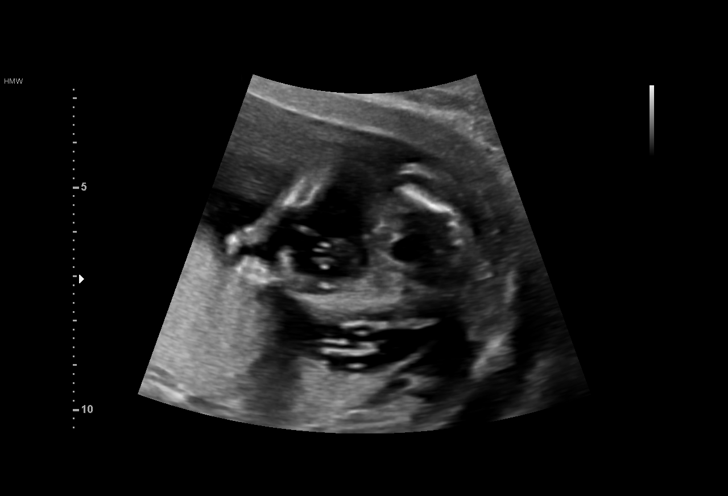
[im 19/72]
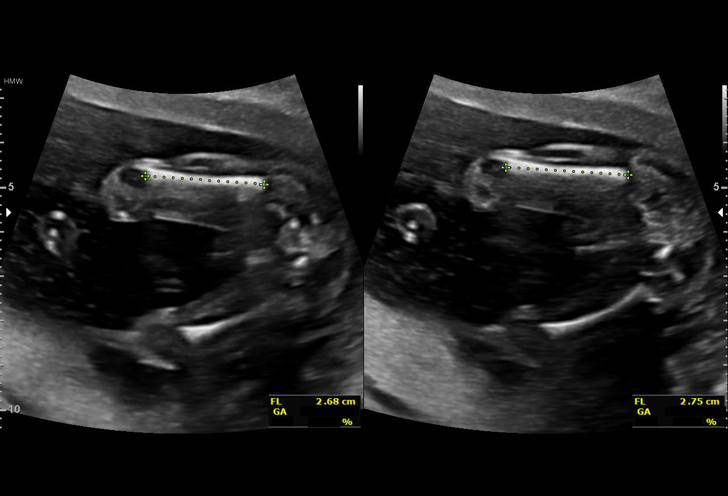
[im 24/72]
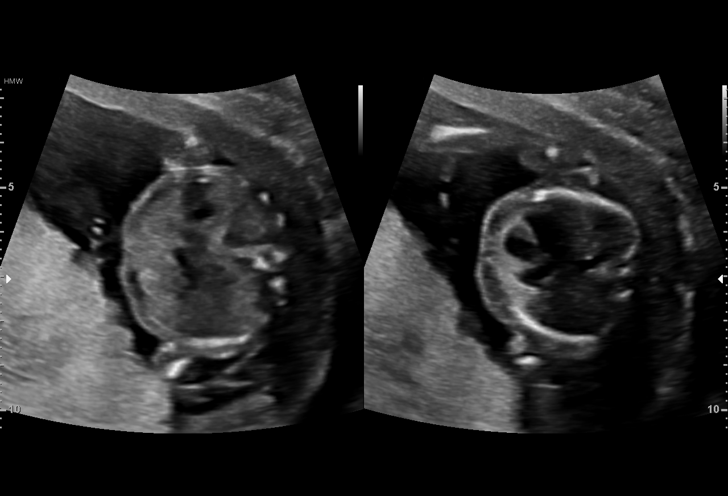
[im 29/72]
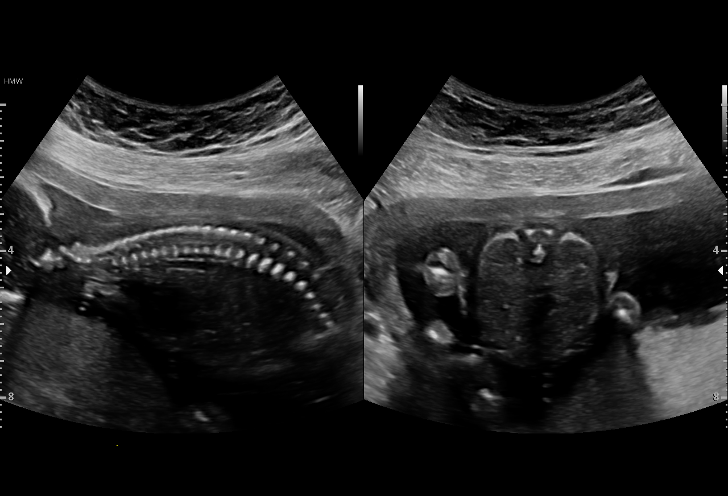
[im 37/72]
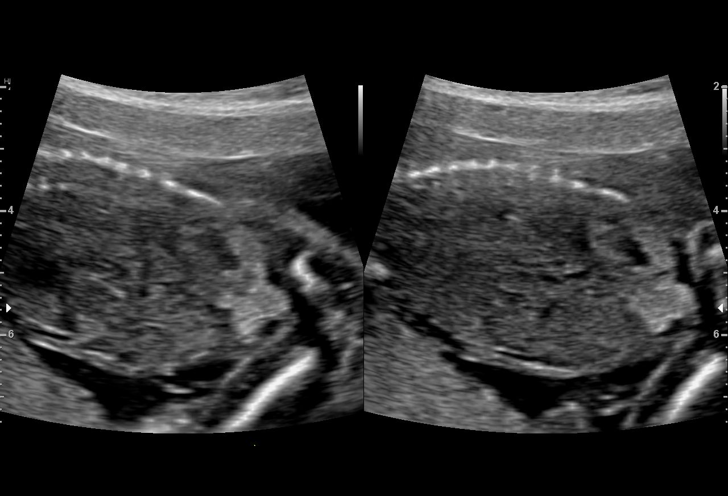
[im 43/72]
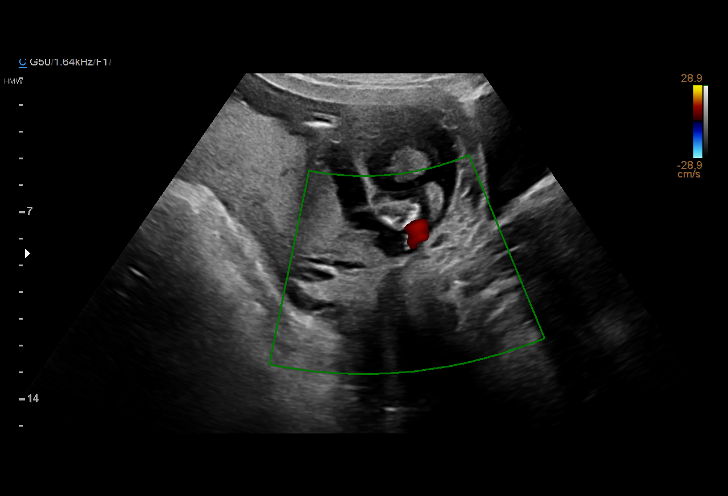
[im 48/72]
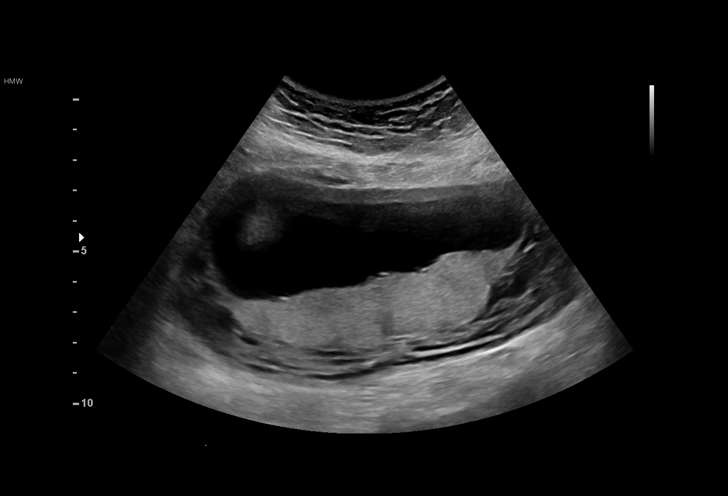
[im 53/72]
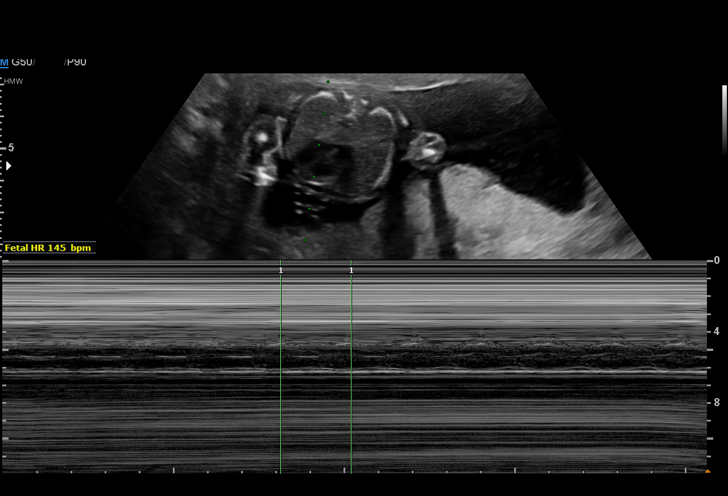
[im 58/72]
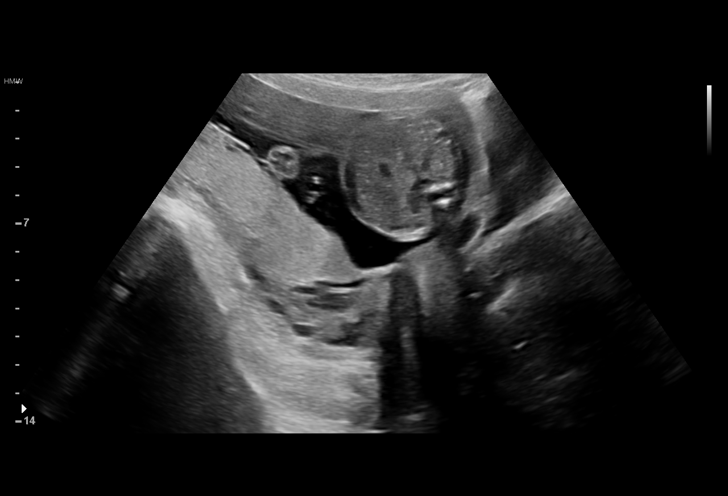
[im 64/72]
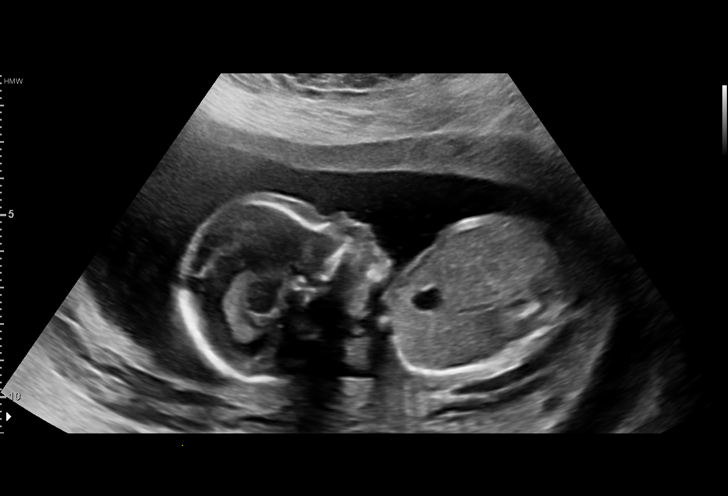
[im 69/72]
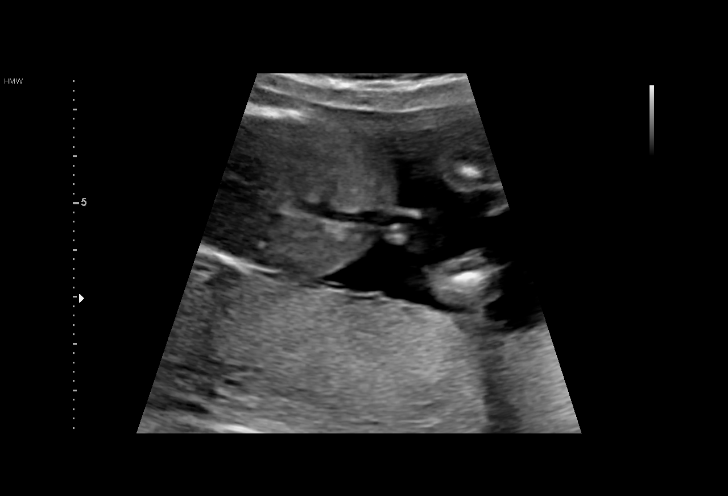

[13 of 28 positions shown; findings below may reference images not displayed]

----------------------------------------------------------------------

 ----------------------------------------------------------------------
Indications

  18 weeks gestation of pregnancy
  Encounter for antenatal screening for
  malformations (low risk NIPS, D.JHH)
  Placenta previa specified as without
  hemorrhage, second trimester
 ----------------------------------------------------------------------
Fetal Evaluation

 Num Of Fetuses:          1
 Fetal Heart Rate(bpm):   145
 Cardiac Activity:        Observed
 Presentation:            Breech
 Placenta:                Posterior Previa
 P. Cord Insertion:       Visualized, central

 Amniotic Fluid
 AFI FV:      Within normal limits

                             Largest Pocket(cm)

Biometry

 BPD:      39.1  mm     G. Age:  17w 6d         39  %    CI:        70.07   %    70 - 86
                                                         FL/HC:       18.3  %    15.8 - 18
 HC:       149   mm     G. Age:  18w 0d         34  %    HC/AC:       1.11       1.07 -
 AC:      134.5  mm     G. Age:  18w 6d         73  %    FL/BPD:      69.6  %
 FL:       27.2  mm     G. Age:  18w 2d         50  %    FL/AC:       20.2  %    20 - 24
 HUM:      25.5  mm     G. Age:  18w 0d         51  %
 CER:        18  mm     G. Age:  18w 0d         44  %
 NFT:       3.3  mm
 CM:        4.8  mm
 Est. FW:     245   gm     0 lb 9 oz     70  %
OB History

 Gravidity:    4         Term:   2        Prem:   0        SAB:   1
 TOP:          0       Ectopic:  0        Living: 2
Gestational Age

 LMP:           18w 1d        Date:  07/07/19                 EDD:   04/12/20
 U/S Today:     18w 2d                                        EDD:   04/11/20
 Best:          18w 1d     Det. By:  LMP  (07/07/19)          EDD:   04/12/20
Anatomy

 Cranium:               Appears normal         LVOT:                   Not well visualized
 Cavum:                 Appears normal         Aortic Arch:            Appears normal
 Ventricles:            Appears normal         Ductal Arch:            Not well visualized
 Choroid Plexus:        Appears normal         Diaphragm:              Appears normal
 Cerebellum:            Appears normal         Stomach:                Appears normal, left
                                                                       sided
 Posterior Fossa:       Appears normal         Abdomen:                Appears normal
 Nuchal Fold:           Appears normal         Abdominal Wall:         Appears nml (cord
                                                                       insert, abd wall)
 Face:                  Profile nl; orbits not Cord Vessels:           Appears normal (3
                        well visualized                                vessel cord)
 Lips:                  Appears normal         Kidneys:                Appear normal
 Palate:                Not well visualized    Bladder:                Appears normal
 Thoracic:              Appears normal         Spine:                  Appears normal
 Heart:                 Appears normal         Upper Extremities:      Appears normal
                        (4CH, axis, and
                        situs)
 RVOT:                  Not well visualized    Lower Extremities:      Appears normal

 Other:  Feet visualized. Technically difficult due to fetal position.
Cervix Uterus Adnexa

 Cervix
 Length:              4  cm.
 Normal appearance by transabdominal scan.

 Uterus
 No abnormality visualized.

 Left Ovary
 No adnexal mass visualized.

 Right Ovary
 No adnexal mass visualized.

 Cul De Sac
 No free fluid seen.

 Adnexa
 No abnormality visualized.
Impression

 We performed fetal anatomy scan. No makers of
 aneuploidies or fetal structural defects are seen. Fetal
 biometry is consistent with her previously-established dates.
 Amniotic fluid is normal and good fetal activity is seen.
 Patient understands the limitations of ultrasound in detecting
 fetal anomalies.
 Placental edge reaches the internal os (previa). Patient does
 not give history of vaginal bleeding. I reassured the patient
 that in most cases, placenta previa resolves with advancing
 gestation.

 On cell-free fetal DNA screening, the risks of fetal
 aneuploidies are not increased.
Recommendations

 -An appointment was made for her to return in 4 weeks for
 completion of fetal anatomy and to evaluate the placental
 position. Transvaginal ultrasound to be performed if low-lying
 placenta is suspected on transabdominal scan.
                      Krier, Rhoda

## 2021-12-13 ENCOUNTER — Encounter: Payer: Self-pay | Admitting: Obstetrics and Gynecology

## 2021-12-13 ENCOUNTER — Ambulatory Visit: Payer: Medicaid Other | Admitting: Obstetrics and Gynecology

## 2021-12-13 ENCOUNTER — Other Ambulatory Visit (HOSPITAL_COMMUNITY)
Admission: RE | Admit: 2021-12-13 | Discharge: 2021-12-13 | Disposition: A | Discharge status: Home / Self Care | Payer: Medicaid Other | Source: Ambulatory Visit | Attending: Obstetrics and Gynecology | Admitting: Obstetrics and Gynecology

## 2021-12-13 ENCOUNTER — Other Ambulatory Visit: Payer: Self-pay

## 2021-12-13 VITALS — BP 116/80 | HR 91 | Ht 68.0 in | Wt 198.0 lb

## 2021-12-13 DIAGNOSIS — Z01419 Encounter for gynecological examination (general) (routine) without abnormal findings: Secondary | ICD-10-CM | POA: Insufficient documentation

## 2021-12-13 DIAGNOSIS — Z113 Encounter for screening for infections with a predominantly sexual mode of transmission: Secondary | ICD-10-CM | POA: Insufficient documentation

## 2021-12-13 DIAGNOSIS — Z975 Presence of (intrauterine) contraceptive device: Secondary | ICD-10-CM

## 2021-12-13 NOTE — Progress Notes (Signed)
Pt presents for IUD check. Pt states she is having discomfort with her IUD and says it is painful during intercourse. IUD placed 05-21-2020. Pt has had some abnormal spotting between periods but is otherwise satisfied with her IUD.  ? ?Last PAP: 09-23-2019 ?Pt is requesting PAP smear and STD testing.  ?

## 2021-12-13 NOTE — Progress Notes (Signed)
? ? ?GYNECOLOGY ANNUAL PREVENTATIVE CARE ENCOUNTER NOTE ? ?History:    ? Amy Little is a 31 y.o. [phone] female here for a routine annual gynecologic exam.  Current complaints: none.   Denies abnormal vaginal bleeding, discharge, pelvic pain, problems with intercourse or other gynecologic concerns.  ?  ?Gynecologic History ?No LMP recorded. (Menstrual status: IUD). ?Contraception: IUD , copper ?Last Pap: 09/23/19. Results were: normal with negative HPV ? ? ?Obstetric History ?OB History  ?Gravida Para Term Preterm AB Living  ?4 3 3   1 3   ?SAB IAB Ectopic Multiple Live Births  ?1     0 3  ?  ?# Outcome Date GA Lbr Len/2nd Weight Sex Delivery Anes PTL Lv  ?4 Term 04/09/20 [redacted]w[redacted]d 10:57 / 00:19 9 lb 7.9 oz (4.306 kg) M Vag-Spont EPI  LIV  ?3 Term 11/30/16 [redacted]w[redacted]d 06:10 / 00:54 7 lb 5.8 oz (3.34 kg) F Vag-Spont EPI  LIV  ?2 Term 2012 [redacted]w[redacted]d  9 lb 12 oz (4.423 kg) M Vag-Spont EPI N LIV  ?   Birth Comments: no diabetes  ?1 SAB           ? ? ?Past Medical History:  ?Diagnosis Date  ? Asthma   ? Bipolar disorder (HCC)   ? "years ago, not sure if it's true"  ? Depression   ? Herpes genitalia   ? rare outbreaks  ? HPV in female   ? PID (pelvic inflammatory disease)   ? Vaginal Pap smear, abnormal   ? ? ?Past Surgical History:  ?Procedure Laterality Date  ? WISDOM TOOTH EXTRACTION    ? ? ?Current Outpatient Medications on File Prior to Visit  ?Medication Sig Dispense Refill  ? albuterol (PROVENTIL HFA;VENTOLIN HFA) 108 (90 Base) MCG/ACT inhaler Inhale 2 puffs into the lungs every 6 (six) hours as needed for wheezing or shortness of breath.     ? albuterol (PROVENTIL) (2.5 MG/3ML) 0.083% nebulizer solution Take 3 mLs (2.5 mg total) by nebulization every 6 (six) hours as needed for wheezing or shortness of breath. 75 mL 0  ? valACYclovir (VALTREX) 1000 MG tablet Take 1 tablet (1,000 mg total) by mouth daily. Take for 5 days 5 tablet 2  ? acetaminophen (TYLENOL) 500 MG tablet Take 500 mg by mouth every 6 (six) hours as needed for  headache.  (Patient not taking: Reported on 05/21/2020)    ? amoxicillin-clavulanate (AUGMENTIN) 500-125 MG tablet Take 1 tablet (500 mg total) by mouth every 8 (eight) hours. (Patient not taking: Reported on 12/13/2021) 21 tablet 0  ? famotidine (PEPCID) 20 MG tablet Take 1 tablet (20 mg total) by mouth 2 (two) times daily. (Patient not taking: Reported on 05/21/2020) 60 tablet 5  ? HYDROcodone-acetaminophen (NORCO) 5-325 MG tablet Take 1-2 tablets by mouth every 6 (six) hours as needed. (Patient not taking: Reported on 12/13/2021) 12 tablet 0  ? ibuprofen (ADVIL) 600 MG tablet Take 1 tablet (600 mg total) by mouth every 6 (six) hours. (Patient not taking: Reported on 08/03/2020) 30 tablet 0  ? neomycin-polymyxin-hydrocortisone (CORTISPORIN) 3.5-10000-1 OTIC suspension Place 4 drops into both ears 4 (four) times daily. X 7 days (Patient not taking: Reported on 12/13/2021) 10 mL 0  ? polyethylene glycol powder (GLYCOLAX/MIRALAX) 17 GM/SCOOP powder Take 17 g by mouth daily as needed. (Patient not taking: Reported on 05/21/2020) 510 g 1  ? Prenatal Vit-DSS-Fe Fum-FA (PRENATAL 19) tablet Take 1 tablet by mouth daily.  (Patient not taking: Reported on 12/13/2021)    ? ?  No current facility-administered medications on file prior to visit.  ? ? ?No Known Allergies ? ?Social History:  reports that she has quit smoking. Her smoking use included cigarettes. She smoked an average of .5 packs per day. She has never used smokeless tobacco. She reports current alcohol use. She reports current drug use. Drug: Marijuana. ? ?Family History  ?Problem Relation Age of Onset  ? Thyroid disease Mother   ? Depression Mother   ? Anxiety disorder Mother   ? ADD / ADHD Mother   ? COPD Father   ? Cancer Maternal Grandmother   ? Cancer Maternal Grandfather   ? COPD Paternal Grandmother   ? ? ?The following portions of the patient's history were reviewed and updated as appropriate: allergies, current medications, past family history, past medical history,  past social history, past surgical history and problem list. ? ?Review of Systems ?Pertinent items noted in HPI and remainder of comprehensive ROS otherwise negative. ? ?Physical Exam:  ?BP 116/80   Pulse 91   Ht 5\' 8"  (1.727 m)   Wt 198 lb (89.8 kg)   BMI 30.11 kg/m?  ?CONSTITUTIONAL: Well-developed, well-nourished female in no acute distress.  ?HENT:  Normocephalic, atraumatic, External right and left ear normal. Oropharynx is clear and moist ?EYES: Conjunctivae and EOM are normal.  ?NECK: Normal range of motion, supple, no masses.  Normal thyroid.  ?SKIN: Skin is warm and dry. No rash noted. Not diaphoretic. No erythema. No pallor. ?MUSCULOSKELETAL: Normal range of motion. No tenderness.  No cyanosis, clubbing, or edema.  2+ distal pulses. ?NEUROLOGIC: Alert and oriented to person, place, and time. Normal reflexes, muscle tone coordination.  ?PSYCHIATRIC: Normal mood and affect. Normal behavior. Normal judgment and thought content. ?CARDIOVASCULAR: Normal heart rate noted, regular rhythm ?RESPIRATORY: Clear to auscultation bilaterally. Effort and breath sounds normal, no problems with respiration noted. ?BREASTS: deferred ?ABDOMEN: Soft, no distention noted.  No tenderness, rebound or guarding.  ?PELVIC: Normal appearing external genitalia and urethral meatus; normal appearing vaginal mucosa and cervix.  No abnormal discharge noted.  Pap smear obtained.  Cultures obtained.  IUD strings easily seen.  Normal uterine size, no other palpable masses, no uterine or adnexal tenderness.  Performed in the presence of a chaperone. ?  ?Assessment and Plan:  ?  1. Women's annual routine gynecological examination ?Normal annual exam ?- Cytology - PAP( [place]) ? ?2. Routine screening for STI (sexually transmitted infection) ?Per request ?- Cervicovaginal ancillary only( [place]) ?- HepB+HepC+HIV Panel ?- RPR ? ?3. IUD (intrauterine device) in place ?IUD strings seen, continue routine surveillance ? ?Will follow up  results of pap smear and manage accordingly. ?Routine preventative health maintenance measures emphasized. ?Please refer to After Visit Summary for other counseling recommendations.  ?   ? ? , MD, FACOG ?Obstetrician Mariel Aloe, Faculty Practice ?[facility], [facility]  ?

## 2021-12-14 LAB — CERVICOVAGINAL ANCILLARY ONLY
Bacterial Vaginitis (gardnerella): POSITIVE — AB
Candida Glabrata: NEGATIVE
Candida Vaginitis: NEGATIVE
Chlamydia: NEGATIVE
Comment: NEGATIVE
Comment: NEGATIVE
Comment: NEGATIVE
Comment: NEGATIVE
Comment: NEGATIVE
Comment: NORMAL
Neisseria Gonorrhea: NEGATIVE
Trichomonas: NEGATIVE

## 2021-12-15 ENCOUNTER — Telehealth: Payer: Self-pay

## 2021-12-15 LAB — CYTOLOGY - PAP
Comment: NEGATIVE
Diagnosis: NEGATIVE
High risk HPV: NEGATIVE

## 2021-12-15 NOTE — Telephone Encounter (Signed)
Attempted to contact about results, no answer, left vm 

## 2021-12-16 ENCOUNTER — Other Ambulatory Visit: Payer: Self-pay

## 2021-12-16 DIAGNOSIS — N760 Acute vaginitis: Secondary | ICD-10-CM

## 2021-12-16 MED ORDER — METRONIDAZOLE 500 MG PO TABS: 500.0000 mg | ORAL_TABLET | Freq: Two times a day (BID) | ORAL | 0 refills | Status: AC

## 2021-12-20 LAB — HEPB+HEPC+HIV PANEL
HIV Screen 4th Generation wRfx: NONREACTIVE
Hep B C IgM: NEGATIVE
Hep B Core Total Ab: NEGATIVE
Hep B E Ab: NEGATIVE
Hep B E Ag: NEGATIVE
Hep B Surface Ab, Qual: NONREACTIVE
Hep C Virus Ab: NONREACTIVE
Hepatitis B Surface Ag: NEGATIVE

## 2021-12-20 LAB — SYPHILIS: RPR W/REFLEX TO RPR TITER AND TREPONEMAL ANTIBODIES, TRADITIONAL SCREENING AND DIAGNOSIS ALGORITHM: RPR Ser Ql: NONREACTIVE

## 2021-12-28 ENCOUNTER — Ambulatory Visit: Payer: Medicaid Other | Admitting: Obstetrics and Gynecology

## 2023-01-30 ENCOUNTER — Ambulatory Visit: Payer: Medicaid Other | Admitting: Obstetrics

## 2023-01-30 DIAGNOSIS — Z975 Presence of (intrauterine) contraceptive device: Secondary | ICD-10-CM

## 2023-01-30 DIAGNOSIS — N939 Abnormal uterine and vaginal bleeding, unspecified: Secondary | ICD-10-CM

## 2023-01-30 NOTE — Progress Notes (Signed)
Patient cancelled
